# Patient Record
Sex: Female | Born: 1990 | Race: Black or African American | Hispanic: No | Marital: Single | Smoking: Never smoker
Health system: Southern US, Community
[De-identification: ages and names within clinical notes are randomized; demographics above are authoritative.]

## PROBLEM LIST (undated history)

## (undated) ENCOUNTER — Inpatient Hospital Stay (HOSPITAL_COMMUNITY): Payer: No Typology Code available for payment source | Admitting: Family Medicine

## (undated) DIAGNOSIS — G4730 Sleep apnea, unspecified: Secondary | ICD-10-CM

## (undated) DIAGNOSIS — E785 Hyperlipidemia, unspecified: Secondary | ICD-10-CM

## (undated) DIAGNOSIS — E119 Type 2 diabetes mellitus without complications: Secondary | ICD-10-CM

## (undated) DIAGNOSIS — I10 Essential (primary) hypertension: Secondary | ICD-10-CM

## (undated) HISTORY — DX: Essential (primary) hypertension: I10

## (undated) HISTORY — DX: Hyperlipidemia, unspecified: E78.5

## (undated) HISTORY — DX: Type 2 diabetes mellitus without complications: E11.9

---
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
## 2009-02-10 ENCOUNTER — Emergency Department (HOSPITAL_COMMUNITY): Admission: EM | Admit: 2009-02-10 | Discharge: 2009-02-10 | Payer: Self-pay | Admitting: Family Medicine

## 2009-10-02 ENCOUNTER — Ambulatory Visit (HOSPITAL_COMMUNITY): Admission: RE | Admit: 2009-10-02 | Discharge: 2009-10-02 | Payer: Self-pay | Admitting: Pediatrics

## 2009-11-18 ENCOUNTER — Observation Stay (HOSPITAL_COMMUNITY): Admission: EM | Admit: 2009-11-18 | Discharge: 2009-11-19 | Payer: Self-pay | Admitting: Emergency Medicine

## 2010-05-14 ENCOUNTER — Emergency Department (HOSPITAL_COMMUNITY): Admission: EM | Admit: 2010-05-14 | Discharge: 2010-05-14 | Payer: Self-pay | Admitting: Emergency Medicine

## 2011-01-12 ENCOUNTER — Emergency Department (HOSPITAL_COMMUNITY)
Admission: EM | Admit: 2011-01-12 | Discharge: 2011-01-13 | Disposition: A | Discharge status: Home / Self Care | Payer: Self-pay | Attending: Emergency Medicine | Admitting: Emergency Medicine

## 2011-01-12 DIAGNOSIS — R1110 Vomiting, unspecified: Secondary | ICD-10-CM | POA: Insufficient documentation

## 2011-01-12 DIAGNOSIS — R197 Diarrhea, unspecified: Secondary | ICD-10-CM | POA: Insufficient documentation

## 2011-01-12 LAB — URINALYSIS, ROUTINE W REFLEX MICROSCOPIC
Hgb urine dipstick: NEGATIVE
Ketones, ur: 15 mg/dL — AB
Leukocytes, UA: NEGATIVE
Nitrite: NEGATIVE
Protein, ur: 100 mg/dL — AB
Specific Gravity, Urine: 1.031 — ABNORMAL HIGH (ref 1.005–1.030)
Urobilinogen, UA: 0.2 mg/dL (ref 0.0–1.0)

## 2011-01-12 LAB — URINE MICROSCOPIC-ADD ON

## 2011-01-12 LAB — POCT PREGNANCY, URINE: Preg Test, Ur: NEGATIVE

## 2011-02-01 LAB — TYPE AND SCREEN
ABO/RH(D): A POS
Antibody Screen: NEGATIVE

## 2011-02-01 LAB — URINALYSIS, ROUTINE W REFLEX MICROSCOPIC
Glucose, UA: 100 mg/dL — AB
Leukocytes, UA: NEGATIVE
Nitrite: NEGATIVE
Protein, ur: 100 mg/dL — AB
pH: 6 (ref 5.0–8.0)

## 2011-02-01 LAB — CBC
HCT: 31.7 % — ABNORMAL LOW (ref 36.0–46.0)
HCT: 38.1 % (ref 36.0–46.0)
Hemoglobin: 10.7 g/dL — ABNORMAL LOW (ref 12.0–15.0)
Hemoglobin: 10.9 g/dL — ABNORMAL LOW (ref 12.0–15.0)
Hemoglobin: 12.7 g/dL (ref 12.0–15.0)
MCHC: 33.4 g/dL (ref 30.0–36.0)
MCHC: 33.8 g/dL (ref 30.0–36.0)
MCV: 71.9 fL — ABNORMAL LOW (ref 78.0–100.0)
MCV: 72.2 fL — ABNORMAL LOW (ref 78.0–100.0)
MCV: 72.4 fL — ABNORMAL LOW (ref 78.0–100.0)
Platelets: 320 10*3/uL (ref 150–400)
Platelets: 390 10*3/uL (ref 150–400)
RBC: 4.39 MIL/uL (ref 3.87–5.11)
RBC: 4.46 MIL/uL (ref 3.87–5.11)
RBC: 5.26 MIL/uL — ABNORMAL HIGH (ref 3.87–5.11)
RDW: 15.7 % — ABNORMAL HIGH (ref 11.5–15.5)
RDW: 16 % — ABNORMAL HIGH (ref 11.5–15.5)
WBC: 10.6 10*3/uL — ABNORMAL HIGH (ref 4.0–10.5)
WBC: 21.2 10*3/uL — ABNORMAL HIGH (ref 4.0–10.5)

## 2011-02-01 LAB — BASIC METABOLIC PANEL
CO2: 24 mEq/L (ref 19–32)
Calcium: 8.9 mg/dL (ref 8.4–10.5)
Chloride: 111 mEq/L (ref 96–112)
GFR calc Af Amer: >60 mL/min (ref >60)
Glucose, Bld: 108 mg/dL — ABNORMAL HIGH (ref 70–99)
Sodium: 140 mEq/L (ref 135–145)

## 2011-02-01 LAB — POCT PREGNANCY, URINE: Preg Test, Ur: NEGATIVE

## 2011-02-01 LAB — URINE MICROSCOPIC-ADD ON

## 2011-02-01 LAB — COMPREHENSIVE METABOLIC PANEL
ALT: 53 U/L — ABNORMAL HIGH (ref 0–35)
AST: 91 U/L — ABNORMAL HIGH (ref 0–37)
Albumin: 3.6 g/dL (ref 3.5–5.2)
Alkaline Phosphatase: 66 U/L (ref 39–117)
BUN: 10 mg/dL (ref 6–23)
CO2: 22 mEq/L (ref 19–32)
Calcium: 9.3 mg/dL (ref 8.4–10.5)
Chloride: 110 mEq/L (ref 96–112)
Creatinine, Ser: 1.07 mg/dL (ref 0.4–1.2)
GFR calc Af Amer: >60 mL/min (ref >60)
GFR calc non Af Amer: >60 mL/min (ref >60)
Glucose, Bld: 104 mg/dL — ABNORMAL HIGH (ref 70–99)
Potassium: 4.1 mEq/L (ref 3.5–5.1)
Sodium: 140 mEq/L (ref 135–145)
Total Bilirubin: 0.3 mg/dL (ref 0.3–1.2)
Total Protein: 7.4 g/dL (ref 6.0–8.3)

## 2011-02-01 LAB — RAPID URINE DRUG SCREEN, HOSP PERFORMED: Tetrahydrocannabinol: POSITIVE — AB

## 2011-02-01 LAB — POCT I-STAT, CHEM 8
BUN: 10 mg/dL (ref 6–23)
Calcium, Ion: 1.16 mmol/L (ref 1.12–1.32)
Chloride: 110 mEq/L (ref 96–112)
Creatinine, Ser: 1 mg/dL (ref 0.4–1.2)
Glucose, Bld: 109 mg/dL — ABNORMAL HIGH (ref 70–99)
HCT: 40 % (ref 36.0–46.0)
Hemoglobin: 13.6 g/dL (ref 12.0–15.0)
Potassium: 4 mEq/L (ref 3.5–5.1)
Sodium: 141 mEq/L (ref 135–145)
TCO2: 21 mmol/L (ref 0–100)

## 2011-02-01 LAB — LACTIC ACID, PLASMA: Lactic Acid, Venous: 3.1 mmol/L — ABNORMAL HIGH (ref 0.5–2.2)

## 2011-02-01 LAB — URINALYSIS, MICROSCOPIC ONLY
Bilirubin Urine: NEGATIVE
Glucose, UA: NEGATIVE mg/dL
Ketones, ur: NEGATIVE mg/dL
Protein, ur: NEGATIVE mg/dL
Urobilinogen, UA: 0.2 mg/dL (ref 0.0–1.0)

## 2011-02-01 LAB — ETHANOL: Alcohol, Ethyl (B): <5 mg/dL (ref 0–10)

## 2011-02-01 LAB — PROTIME-INR
INR: 0.98 (ref 0.00–1.49)
Prothrombin Time: 12.9 seconds (ref 11.6–15.2)

## 2011-02-01 LAB — APTT: aPTT: 24 seconds (ref 24–37)

## 2011-02-01 LAB — ABO/RH: ABO/RH(D): A POS

## 2011-02-19 ENCOUNTER — Inpatient Hospital Stay (INDEPENDENT_AMBULATORY_CARE_PROVIDER_SITE_OTHER)
Admission: RE | Admit: 2011-02-19 | Discharge: 2011-02-19 | Disposition: A | Discharge status: Home / Self Care | Payer: Medicaid Other | Source: Ambulatory Visit | Attending: Family Medicine | Admitting: Family Medicine

## 2011-02-19 DIAGNOSIS — R6889 Other general symptoms and signs: Secondary | ICD-10-CM

## 2011-03-31 NOTE — Procedures (Signed)
RENAL ARTERY DUPLEX EVALUATION   INDICATION:  Recent onset hypertension.   HISTORY:  Diabetes:  No.  Cardiac:  No.  Hypertension:  Yes.  Smoking:  No.   RENAL ARTERY DUPLEX FINDINGS:  Aorta-Proximal:  238 cm/s  Aorta-Mid:  134 cm/s  Aorta-Distal:  59 cm/s  Celiac Artery Origin:  146 cm/s  SMA Origin:  199 cm/s                                    RIGHT               LEFT  Renal Artery Origin:             208/60 cm/s         cm/s  Renal Artery Proximal:           71/38 cm/s          107/45 cm/s  Renal Artery Mid:                50/38 cm/s          42/20 cm/s  Renal Artery Distal:             63/34 cm/s          38/21 cm/s  Hilar Acceleration Time (AT):  Renal-Aortic Ratio (RAR):        <1.0                <1.0  Kidney Size:                     11.6 cm x 6.3 cm    11.8 cm x 5.4 cm  End Diastolic Ratio (EDR):       0.47 to 0.57        0.44 to 0.57  Resistive Index (RI):            0.47                0.58   IMPRESSION:  1. Kidneys are normal with respect to size and shape bilaterally.  2. Resistive indices and end diastolic ratio suggests no evidence of      parenchymal disease bilaterally.  3. Renal to aortic ratio suggests no significant renal artery      occlusive disease.       ___________________________________________  Larina Earthly, M.D.   MC/MEDQ  D:  11/07/2008  T:  11/07/2008  Job:  [id number]

## 2011-04-03 NOTE — Procedures (Signed)
NAME:  Grace Daniels, Grace Daniels NO.:  [id number]   MEDICAL RECORD NO.:  [id number]          PATIENT TYPE:  OUT   LOCATION:  SLEEP CENTER                 FACILITY:  [facility]   PHYSICIAN:  Clinton D. Maple Hudson, M.D. DATE OF BIRTH:  11-Jun-1991   DATE OF STUDY:  11/21/2005                              NOCTURNAL POLYSOMNOGRAM   REFERRING PHYSICIAN:  Dr. Osborn Coho.   DATE OF STUDY:  November 21, 2005.   INDICATION FOR STUDY:  Hypersomnia with sleep apnea.   EPWORTH SLEEPINESS SCORE:  6/24.   BMI:  36.9.   WEIGHT:  250 pounds.   HOME MEDICATIONS:  Norvasc.   SLEEP ARCHITECTURE:  Total sleep time 367 minutes with sleep efficiency 79%.  Stage I was 4%, stage II 64%, stages III and IV 12%, REM 20% of total sleep  time. Sleep latency 46 minutes, REM latency 224 minutes, awake after sleep  onset 50 minutes, arousal index 27.8. No bedtime medication reported.   RESPIRATORY DATA:  Split study protocol:  Apnea/hypopnea index (AHI, RDI)  26.8 obstructive events per hour indicating moderate obstructive sleep  apnea/hypopnea syndrome. There were 2 obstructive apneas and 60 hypopneas  before C-PAP. Events were more common while supine (AHI 12.4 per hour). REM  AHI 0. C-PAP was titrated to 9 CWP, AHI 0 per hour. A small full-face ultra  mirage mask was used with heated humidifier.   OXYGEN DATA:  Loud snoring with oxygen desaturation briefly to a nadir of  38%. This may include artifact. After C-PAP control, saturation held 95-98%  on room air with snoring eliminated.   CARDIAC DATA:  Normal sinus rhythm.   MOVEMENT/PARASOMNIA:  A total of 130 limb jerks were reported of which only  7 were associated with arousal or awakening for a periodic limb movement  with arousal index of 1.1 per hour which is insignificant. Bathroom x1.   IMPRESSION/RECOMMENDATIONS:  1.  Moderate obstructive sleep apnea/hypopnea syndrome, AHI 26.8 per hour      with events more common while supine. Very loud  snoring with transient      oxygen desaturation which may include artifact to 38%. Normal      oxygenation with snoring prevented once C-PAP was established.  2.  A small full-face ultra mirage mask was used with heated humidifier.      Clinton D. Maple Hudson, M.D.  Diplomate, Biomedical engineer of Sleep Medicine  Electronically Signed     CDY/MEDQ  D:  11/29/2005 11:43:27  T:  11/29/2005 22:45:26  Job:  [id number]

## 2011-04-14 ENCOUNTER — Encounter: Payer: Self-pay | Admitting: Cardiology

## 2011-04-15 ENCOUNTER — Institutional Professional Consult (permissible substitution): Payer: Medicaid Other | Admitting: Cardiology

## 2011-06-01 ENCOUNTER — Ambulatory Visit (INDEPENDENT_AMBULATORY_CARE_PROVIDER_SITE_OTHER): Payer: Medicaid Other

## 2011-06-01 ENCOUNTER — Inpatient Hospital Stay (INDEPENDENT_AMBULATORY_CARE_PROVIDER_SITE_OTHER)
Admission: RE | Admit: 2011-06-01 | Discharge: 2011-06-01 | Disposition: A | Discharge status: Home / Self Care | Payer: Medicaid Other | Source: Ambulatory Visit | Attending: Family Medicine | Admitting: Family Medicine

## 2011-06-01 DIAGNOSIS — S93409A Sprain of unspecified ligament of unspecified ankle, initial encounter: Secondary | ICD-10-CM

## 2011-06-01 DIAGNOSIS — W1789XA Other fall from one level to another, initial encounter: Secondary | ICD-10-CM

## 2011-09-01 LAB — POCT PREGNANCY, URINE
Operator id: 285841
Preg Test, Ur: NEGATIVE

## 2011-09-01 LAB — URINALYSIS, ROUTINE W REFLEX MICROSCOPIC
Bilirubin Urine: NEGATIVE
Glucose, UA: NEGATIVE
Hgb urine dipstick: NEGATIVE
Ketones, ur: NEGATIVE
Nitrite: NEGATIVE
Protein, ur: NEGATIVE
Specific Gravity, Urine: 1.023
Urobilinogen, UA: 1
pH: 6.5

## 2011-10-25 ENCOUNTER — Emergency Department (INDEPENDENT_AMBULATORY_CARE_PROVIDER_SITE_OTHER)
Admission: EM | Admit: 2011-10-25 | Discharge: 2011-10-25 | Disposition: A | Discharge status: Home / Self Care | Payer: Medicaid Other | Source: Home / Self Care | Attending: Emergency Medicine | Admitting: Emergency Medicine

## 2011-10-25 ENCOUNTER — Encounter (HOSPITAL_COMMUNITY): Payer: Self-pay

## 2011-10-25 DIAGNOSIS — H60399 Other infective otitis externa, unspecified ear: Secondary | ICD-10-CM

## 2011-10-25 DIAGNOSIS — H6090 Unspecified otitis externa, unspecified ear: Secondary | ICD-10-CM

## 2011-10-25 MED ORDER — IBUPROFEN 800 MG PO TABS: 800.0000 mg | ORAL_TABLET | Freq: Three times a day (TID) | ORAL | Status: AC

## 2011-10-25 MED ORDER — NEOMYCIN-POLYMYXIN-HC 3.5-10000-1 OT SUSP: 4.0000 [drp] | Freq: Three times a day (TID) | OTIC | Status: AC

## 2011-10-25 NOTE — ED Provider Notes (Signed)
History     CSN: [id number] Arrival date & time: 10/25/2011  1:40 PM   First MD Initiated Contact with Patient 10/25/11 1333      Chief Complaint  Patient presents with  . Otalgia    pain in left ear     (Consider location/radiation/quality/duration/timing/severity/associated sxs/prior treatment) Patient is a 20 y.o. female presenting with ear pain. The history is provided by the patient.  Otalgia This is a new problem. The current episode started 2 days ago. There is pain in the left ear. The problem occurs constantly. The problem has not changed since onset.There has been no fever. Pertinent negatives include no ear discharge, no hearing loss, no rhinorrhea, no sore throat and no neck pain. Her past medical history does not include chronic ear infection.    Past Medical History  Diagnosis Date  . Hypertension   . Hyperlipidemia     History reviewed. No pertinent past surgical history.  History reviewed. No pertinent family history.  History  Substance Use Topics  . Smoking status: Never Smoker   . Smokeless tobacco: Never Used  . Alcohol Use: No    OB History    Grav Para Term Preterm Abortions TAB SAB Ect Mult Living                  Review of Systems  Constitutional: Negative.   HENT: Positive for ear pain. Negative for hearing loss, congestion, sore throat, rhinorrhea, neck pain and ear discharge.   Eyes: Negative.   Hematological: Negative for adenopathy.    Allergies  Review of patient's allergies indicates no known allergies.  Home Medications   Current Outpatient Rx  Name Route Sig Dispense Refill  . AMLODIPINE BESYLATE 10 MG PO TABS Oral Take 10 mg by mouth daily.      . TRIAMTERENE-HCTZ 37.5-25 MG PO CAPS Oral Take 1 capsule by mouth every morning.      . IBUPROFEN 800 MG PO TABS Oral Take 1 tablet (800 mg total) by mouth 3 (three) times daily. 30 tablet 0  . NEOMYCIN-POLYMYXIN-HC 3.5-10000-1 OT SUSP Left Ear Place 4 drops into the left ear 3  (three) times daily. 10 mL 0    BP 171/95  Pulse 85  Temp(Src) 97.8 F (36.6 C) (Oral)  Resp 16  SpO2 98%  Physical Exam  Nursing note and vitals reviewed. Constitutional: She appears well-developed and well-nourished.  HENT:  Head: Normocephalic and atraumatic.  Right Ear: Tympanic membrane, external ear and ear canal normal.  Left Ear: Tympanic membrane normal. There is swelling and tenderness. No drainage. Tympanic membrane is not injected and not erythematous. No decreased hearing is noted.  Mouth/Throat: Oropharynx is clear and moist.    ED Course  Procedures (including critical care time)  Labs Reviewed - No data to display No results found.   1. Otitis external       MDM          Barkley Bruns, MD 10/25/11 [phone]

## 2011-10-25 NOTE — ED Notes (Signed)
Pt c/o pain in left ear and states can't hear out of left ear started 2 days ago

## 2011-12-31 ENCOUNTER — Encounter (HOSPITAL_COMMUNITY): Payer: Self-pay | Admitting: Emergency Medicine

## 2011-12-31 ENCOUNTER — Emergency Department (HOSPITAL_COMMUNITY)
Admission: EM | Admit: 2011-12-31 | Discharge: 2011-12-31 | Disposition: A | Discharge status: Home / Self Care | Payer: Self-pay | Attending: Emergency Medicine | Admitting: Emergency Medicine

## 2011-12-31 DIAGNOSIS — R51 Headache: Secondary | ICD-10-CM | POA: Insufficient documentation

## 2011-12-31 DIAGNOSIS — E785 Hyperlipidemia, unspecified: Secondary | ICD-10-CM | POA: Insufficient documentation

## 2011-12-31 DIAGNOSIS — R05 Cough: Secondary | ICD-10-CM | POA: Insufficient documentation

## 2011-12-31 DIAGNOSIS — IMO0001 Reserved for inherently not codable concepts without codable children: Secondary | ICD-10-CM | POA: Insufficient documentation

## 2011-12-31 DIAGNOSIS — I10 Essential (primary) hypertension: Secondary | ICD-10-CM | POA: Insufficient documentation

## 2011-12-31 DIAGNOSIS — R509 Fever, unspecified: Secondary | ICD-10-CM | POA: Insufficient documentation

## 2011-12-31 DIAGNOSIS — J3489 Other specified disorders of nose and nasal sinuses: Secondary | ICD-10-CM | POA: Insufficient documentation

## 2011-12-31 DIAGNOSIS — R059 Cough, unspecified: Secondary | ICD-10-CM | POA: Insufficient documentation

## 2011-12-31 DIAGNOSIS — J069 Acute upper respiratory infection, unspecified: Secondary | ICD-10-CM | POA: Insufficient documentation

## 2011-12-31 NOTE — ED Provider Notes (Signed)
History     CSN: [id number]  Arrival date & time 12/31/11  4098   First MD Initiated Contact with Patient 12/31/11 1957      Chief Complaint  Patient presents with  . URI    (Consider location/radiation/quality/duration/timing/severity/associated sxs/prior treatment) Patient is a 21 y.o. female presenting with URI. No language interpreter was used.  URI The primary symptoms include fever, headaches, cough and myalgias. The current episode started 3 to 5 days ago. This is a new problem. The problem has been gradually worsening.  The fever began 3 to 5 days ago. The fever has been unchanged since its onset. The maximum temperature recorded prior to her arrival was unknown.  The headache began more than 2 days ago. Headache is a new problem. The headache is present intermittently. The headache is not associated with aura, photophobia, double vision, eye pain, decreased vision, stiff neck, paresthesias, weakness or loss of balance.  The cough began 3 to 5 days ago. The cough is productive and hacking. The sputum is white.  Myalgias began 3 to 5 days ago. The myalgias are generalized. The discomfort from the myalgias is mild. The myalgias are not associated with weakness.  The onset of the illness is associated with exposure to sick contacts. Symptoms associated with the illness include chills and congestion.    Past Medical History  Diagnosis Date  . Hypertension   . Hyperlipidemia     History reviewed. No pertinent past surgical history.  History reviewed. No pertinent family history.  History  Substance Use Topics  . Smoking status: Never Smoker   . Smokeless tobacco: Never Used  . Alcohol Use: No    OB History    Grav Para Term Preterm Abortions TAB SAB Ect Mult Living                  Review of Systems  Constitutional: Positive for fever and chills.  HENT: Positive for congestion.   Eyes: Negative for double vision, photophobia and pain.  Respiratory: Positive for  cough.   Musculoskeletal: Positive for myalgias.  Neurological: Positive for headaches. Negative for weakness, paresthesias and loss of balance.  All other systems reviewed and are negative.    Allergies  Review of patient's allergies indicates no known allergies.  Home Medications   Current Outpatient Rx  Name Route Sig Dispense Refill  . TUSSIN CHEST CONGESTION PO Oral Take 10 mLs by mouth every 4 (four) hours as needed. For cough    . TRIAMTERENE-HCTZ 37.5-25 MG PO CAPS Oral Take 1 capsule by mouth every morning.        BP 133/76  Pulse 122  Temp(Src) 97.8 F (36.6 C) (Oral)  Resp 18  SpO2 96%  Physical Exam  Nursing note and vitals reviewed. Constitutional: She is oriented to person, place, and time. She appears well-developed and well-nourished.  HENT:  Head: Normocephalic and atraumatic.  Eyes: Pupils are equal, round, and reactive to light.  Neck: Normal range of motion. Neck supple.  Cardiovascular: Normal rate, regular rhythm, normal heart sounds and intact distal pulses.   Pulmonary/Chest: Effort normal and breath sounds normal. She has no wheezes. She has no rales. She exhibits no tenderness.  Abdominal: Soft. Bowel sounds are normal.  Musculoskeletal: Normal range of motion.  Neurological: She is alert and oriented to person, place, and time.  Skin: Skin is warm and dry.  Psychiatric: She has a normal mood and affect.    ED Course  Procedures (including critical care time)  Labs Reviewed - No data to display No results found.   No diagnosis found.  URI  MDM          Jimmye Norman, NP 12/31/11 2021

## 2011-12-31 NOTE — ED Notes (Signed)
Pt here for cough x 1 week with cold sx

## 2011-12-31 NOTE — Discharge Instructions (Signed)

## 2011-12-31 NOTE — ED Provider Notes (Signed)
Medical screening examination/treatment/procedure(s) were performed by non-physician practitioner and as supervising physician I was immediately available for consultation/collaboration.  Ethelda Chick, MD 12/31/11 [phone]

## 2012-02-05 ENCOUNTER — Emergency Department (INDEPENDENT_AMBULATORY_CARE_PROVIDER_SITE_OTHER)
Admission: EM | Admit: 2012-02-05 | Discharge: 2012-02-05 | Disposition: A | Discharge status: Home / Self Care | Payer: Self-pay | Source: Home / Self Care | Attending: Family Medicine | Admitting: Family Medicine

## 2012-02-05 ENCOUNTER — Encounter (HOSPITAL_COMMUNITY): Payer: Self-pay

## 2012-02-05 DIAGNOSIS — H6122 Impacted cerumen, left ear: Secondary | ICD-10-CM

## 2012-02-05 DIAGNOSIS — H6120 Impacted cerumen, unspecified ear: Secondary | ICD-10-CM

## 2012-02-05 HISTORY — DX: Sleep apnea, unspecified: G47.30

## 2012-02-05 NOTE — ED Provider Notes (Signed)
History     CSN: [id number]  Arrival date & time 02/05/12  1242   First MD Initiated Contact with Patient 02/05/12 1329      Chief Complaint  Patient presents with  . Otalgia    (Consider location/radiation/quality/duration/timing/severity/associated sxs/prior treatment) Patient is a 21 y.o. female presenting with ear pain. The history is provided by the patient.  Otalgia This is a new problem. The current episode started more than 1 week ago (onset after using q-tips). There is pain in the left ear. The problem occurs constantly. The problem has not changed since onset.There has been no fever. The pain is mild. Associated symptoms include ear discharge. Her past medical history is significant for hearing loss.    Past Medical History  Diagnosis Date  . Hypertension   . Hyperlipidemia   . Sleep apnea     History reviewed. No pertinent past surgical history.  No family history on file.  History  Substance Use Topics  . Smoking status: Never Smoker   . Smokeless tobacco: Never Used  . Alcohol Use: No    OB History    Grav Para Term Preterm Abortions TAB SAB Ect Mult Living                  Review of Systems  Constitutional: Negative.   HENT: Positive for ear pain and ear discharge.     Allergies  Review of patient's allergies indicates no known allergies.  Home Medications   Current Outpatient Rx  Name Route Sig Dispense Refill  . TRIAMTERENE-HCTZ 37.5-25 MG PO CAPS Oral Take 1 capsule by mouth every morning.      Elmo Putt CHEST CONGESTION PO Oral Take 10 mLs by mouth every 4 (four) hours as needed. For cough      BP 120/79  Pulse 110  Temp(Src) 97.9 F (36.6 C) (Oral)  Resp 20  SpO2 98%  LMP 01/29/2012  Physical Exam  Nursing note and vitals reviewed. Constitutional: She appears well-developed and well-nourished.  HENT:  Right Ear: Tympanic membrane, external ear and ear canal normal.  Ears:    ED Course  Procedures (including critical care  time)  Labs Reviewed - No data to display No results found.   1. Impacted cerumen of left ear       MDM  Sx resolved after irrig, tm nl.        Linna Hoff, MD 02/05/12 1414

## 2012-02-05 NOTE — Discharge Instructions (Signed)
No Q-tips.

## 2012-02-05 NOTE — ED Notes (Signed)
C/o lt ear pain for 1 week.  Denies fever or cold sx.

## 2012-03-18 ENCOUNTER — Emergency Department (HOSPITAL_COMMUNITY)
Admission: EM | Admit: 2012-03-18 | Discharge: 2012-03-18 | Disposition: A | Discharge status: Home / Self Care | Payer: Self-pay | Attending: Emergency Medicine | Admitting: Emergency Medicine

## 2012-03-18 ENCOUNTER — Encounter (HOSPITAL_COMMUNITY): Payer: Self-pay | Admitting: Emergency Medicine

## 2012-03-18 DIAGNOSIS — I10 Essential (primary) hypertension: Secondary | ICD-10-CM | POA: Insufficient documentation

## 2012-03-18 DIAGNOSIS — E785 Hyperlipidemia, unspecified: Secondary | ICD-10-CM | POA: Insufficient documentation

## 2012-03-18 DIAGNOSIS — K219 Gastro-esophageal reflux disease without esophagitis: Secondary | ICD-10-CM | POA: Insufficient documentation

## 2012-03-18 DIAGNOSIS — R079 Chest pain, unspecified: Secondary | ICD-10-CM | POA: Insufficient documentation

## 2012-03-18 DIAGNOSIS — Z79899 Other long term (current) drug therapy: Secondary | ICD-10-CM | POA: Insufficient documentation

## 2012-03-18 MED ORDER — OMEPRAZOLE 20 MG PO CPDR: 20.0000 mg | DELAYED_RELEASE_CAPSULE | Freq: Every day | ORAL | Status: DC

## 2012-03-18 NOTE — Discharge Instructions (Signed)
Diet for GERD or PUD Nutrition therapy can help ease the discomfort of gastroesophageal reflux disease (GERD) and peptic ulcer disease (PUD).  HOME CARE INSTRUCTIONS   Eat your meals slowly, in a relaxed setting.   Eat 5 to 6 small meals per day.   If a food causes distress, stop eating it for a period of time.  FOODS TO AVOID  Coffee, regular or decaffeinated.   Cola beverages, regular or low calorie.   Tea, regular or decaffeinated.   Pepper.   Cocoa.   High fat foods, including meats.   Butter, margarine, hydrogenated oil (trans fats).   Peppermint or spearmint (if you have GERD).   Fruits and vegetables if not tolerated.   Alcohol.   Nicotine (smoking or chewing). This is one of the most potent stimulants to acid production in the gastrointestinal tract.   Any food that seems to aggravate your condition.  If you have questions regarding your diet, ask your caregiver or a registered dietitian. TIPS  Lying flat may make symptoms worse. Keep the head of your bed raised 6 to 9 inches (15 to 23 cm) by using a foam wedge or blocks under the legs of the bed.   Do not lay down until 3 hours after eating a meal.   Daily physical activity may help reduce symptoms.  MAKE SURE YOU:   Understand these instructions.   Will watch your condition.   Will get help right away if you are not doing well or get worse.  Document Released: 11/02/2005 Document Revised: 10/22/2011 Document Reviewed: 09/18/2011 [facility] Patient Information 2012 [place], [place].Gastroesophageal Reflux Disease, Adult Gastroesophageal reflux disease (GERD) happens when acid from your stomach goes into your food pipe (esophagus). The acid can cause a burning feeling in your chest. Over time, the acid can make small holes (ulcers) in your food pipe.  HOME CARE  Ask your doctor for advice about:   Losing weight.   Quitting smoking.   Alcohol use.   Avoid foods and drinks that make your problems  worse. You may want to avoid:   Caffeine and alcohol.   Chocolate.   Mints.   Garlic and onions.   Spicy foods.   Citrus fruits, such as oranges, lemons, or limes.   Foods that contain tomato, such as sauce, chili, salsa, and pizza.   Fried and fatty foods.   Avoid lying down for 3 hours before you go to bed or before you take a nap.   Eat small meals often, instead of large meals.   Wear loose-fitting clothing. Do not wear anything tight around your waist.   Raise (elevate) the head of your bed 6 to 8 inches with wood blocks. Using extra pillows does not help.   Only take medicines as told by your doctor.   Do not take aspirin or ibuprofen.  GET HELP RIGHT AWAY IF:   You have pain in your arms, neck, jaw, teeth, or back.   Your pain gets worse or changes.   You feel sick to your stomach (nauseous), throw up (vomit), or sweat (diaphoresis).   You feel short of breath, or you pass out (faint).   Your throw up is green, yellow, black, or looks like coffee grounds or blood.   Your poop (stool) is red, bloody, or black.  MAKE SURE YOU:   Understand these instructions.   Will watch your condition.   Will get help right away if you are not doing well or get worse.  Document Released: 04/20/2008 Document Revised: 10/22/2011 Document Reviewed: 05/22/2011 [employer] Patient Information 2012 [place], [facility] Patient Information 2012 [place], [place].

## 2012-03-18 NOTE — ED Notes (Signed)
Pt states that she has  Cp x  Week it is a burning sensation states that occ she feels her heart jerking, states loves catsup, tried a zantac a zantac one time with pain and it helped

## 2012-03-18 NOTE — ED Provider Notes (Signed)
History   This chart was scribed for Nelia Shi, MD by Brooks Sailors. The patient was seen in room STRE8/STRE8. Patient's care was started at 1055.   CSN: [id number]  Arrival date & time 03/18/12  1055   First MD Initiated Contact with Patient 03/18/12 1113      Chief Complaint  Patient presents with  . Chest Pain    (Consider location/radiation/quality/duration/timing/severity/associated sxs/prior treatment) HPI  Grace Daniels is a 21 y.o. female who presents to the Emergency Department complaining of intermittent chest pain onset one week ago. Patient says the pain is in the center of her chest and does not radiate but has an urge to "burp" but nothing comes up. Patient believes the pain may be from acid-reflux and has been taking Zantac which has alleviated symptoms. Says symptoms are worse at night when the patient lays down. Patient denies a history of heart problems.     Past Medical History  Diagnosis Date  . Hypertension   . Hyperlipidemia   . Sleep apnea     History reviewed. No pertinent past surgical history.  History reviewed. No pertinent family history.  History  Substance Use Topics  . Smoking status: Never Smoker   . Smokeless tobacco: Never Used  . Alcohol Use: No    OB History    Grav Para Term Preterm Abortions TAB SAB Ect Mult Living                  Review of Systems  All other systems reviewed and are negative.    Allergies  Review of patient's allergies indicates no known allergies.  Home Medications   Current Outpatient Rx  Name Route Sig Dispense Refill  . RANITIDINE HCL 75 MG PO TABS Oral Take 75 mg by mouth 2 (two) times daily as needed. Acid indigestion    . TRIAMTERENE-HCTZ 37.5-25 MG PO CAPS Oral Take 1 capsule by mouth every morning.      Marland Kitchen OMEPRAZOLE 20 MG PO CPDR Oral Take 1 capsule (20 mg total) by mouth daily. 30 capsule 0    BP 132/72  Pulse 97  Temp(Src) 97.5 F (36.4 C) (Oral)  Resp 20  SpO2  98%  Physical Exam  Nursing note and vitals reviewed. Constitutional: She is oriented to person, place, and time. She appears well-developed and well-nourished.  HENT:  Head: Normocephalic and atraumatic.  Neck: Normal range of motion. Neck supple.  Cardiovascular: Normal rate.  Exam reveals no gallop and no friction rub.   No murmur heard.       Date: 03/20/2012  Rate:83  Rhythm: normal sinus rhythm  QRS Axis: normal  Intervals: normal  ST/T Wave abnormalities: normal  Conduction Disutrbances: none  Narrative Interpretation: unremarkable      Pulmonary/Chest: Effort normal and breath sounds normal.       Equal bilateral.   Abdominal: Soft. Bowel sounds are normal.       Active bowel sounds.   Musculoskeletal: Normal range of motion.  Neurological: She is alert and oriented to person, place, and time.  Skin: Skin is warm and dry.  Psychiatric: She has a normal mood and affect.    ED Course  Procedures (including critical care time) DIAGNOSTIC STUDIES: Oxygen Saturation is 98% on room air, normal by my interpretation.    COORDINATION OF CARE: 11:24AM - Patient recommended to take zantac over the next two weeks.    Labs Reviewed - No data to display No results found.  1. GERD (gastroesophageal reflux disease)       MDM  I personally performed the services described in this documentation, which was scribed in my presence. The recorded information has been reviewed and considered.       Nelia Shi, MD 03/20/12 [phone]

## 2012-03-18 NOTE — ED Notes (Signed)
Pt c/o midsternal/epigastric pain x 1 week worse after eating and laying flat that goes through to back; pt sts some SOB

## 2013-02-11 ENCOUNTER — Encounter (HOSPITAL_COMMUNITY): Payer: Self-pay | Admitting: *Deleted

## 2013-02-11 ENCOUNTER — Emergency Department (HOSPITAL_COMMUNITY)
Admission: EM | Admit: 2013-02-11 | Discharge: 2013-02-12 | Disposition: A | Discharge status: Home / Self Care | Payer: Self-pay | Attending: Emergency Medicine | Admitting: Emergency Medicine

## 2013-02-11 DIAGNOSIS — H6122 Impacted cerumen, left ear: Secondary | ICD-10-CM

## 2013-02-11 DIAGNOSIS — H6120 Impacted cerumen, unspecified ear: Secondary | ICD-10-CM | POA: Insufficient documentation

## 2013-02-11 DIAGNOSIS — Z8669 Personal history of other diseases of the nervous system and sense organs: Secondary | ICD-10-CM | POA: Insufficient documentation

## 2013-02-11 DIAGNOSIS — I10 Essential (primary) hypertension: Secondary | ICD-10-CM | POA: Insufficient documentation

## 2013-02-11 DIAGNOSIS — Z8639 Personal history of other endocrine, nutritional and metabolic disease: Secondary | ICD-10-CM | POA: Insufficient documentation

## 2013-02-11 DIAGNOSIS — Z862 Personal history of diseases of the blood and blood-forming organs and certain disorders involving the immune mechanism: Secondary | ICD-10-CM | POA: Insufficient documentation

## 2013-02-11 DIAGNOSIS — Z79899 Other long term (current) drug therapy: Secondary | ICD-10-CM | POA: Insufficient documentation

## 2013-02-11 MED ORDER — CARBAMIDE PEROXIDE 6.5 % OT SOLN: 5.0000 [drp] | Freq: Two times a day (BID) | OTIC | Status: DC

## 2013-02-11 MED ORDER — ANTIPYRINE-BENZOCAINE 5.4-1.4 % OT SOLN: 3.0000 [drp] | OTIC | Status: DC | PRN

## 2013-02-11 MED ORDER — ANTIPYRINE-BENZOCAINE 5.4-1.4 % OT SOLN
3.0000 [drp] | Freq: Once | OTIC | Status: AC
  Administered 2013-02-12: 4 [drp] via OTIC
  Filled 2013-02-11: qty 10

## 2013-02-11 NOTE — ED Provider Notes (Signed)
History    This chart was scribed for non-physician practitioner, Marlon Pel PA-C working with Rolan Bucco, MD by Smitty Pluck, ED scribe. This patient was seen in room WTR6/WTR6 and the patient's care was started at 10:16 PM.   CSN: [id number]  Arrival date & time 02/11/13  2116   Chief Complaint  Patient presents with  . Otalgia    The history is provided by the patient. No language interpreter was used.   Grace Daniels is a 22 y.o. female with hx of HTN who presents to the Emergency Department complaining of constant, moderate left otalgia onset 3 days ago. She reports that there was sudden onset. She states she has hx of left ear otalgia. She reports that she thinks that she pushed the q-tip too far into her ear. She mentions having decreased hearing in left ear. She states that she has used ear drops without relief. Pt denies fever, chills, nausea, vomiting, diarrhea, weakness, cough, SOB and any other pain.    Past Medical History  Diagnosis Date  . Hypertension   . Hyperlipidemia   . Sleep apnea     History reviewed. No pertinent past surgical history.  History reviewed. No pertinent family history.  History  Substance Use Topics  . Smoking status: Never Smoker   . Smokeless tobacco: Never Used  . Alcohol Use: No    OB History   Grav Para Term Preterm Abortions TAB SAB Ect Mult Living                  Review of Systems  Constitutional: Negative for fever and chills.  HENT: Positive for ear pain.   Respiratory: Negative for shortness of breath.   Gastrointestinal: Negative for nausea and vomiting.  Neurological: Negative for weakness.  All other systems reviewed and are negative.    Allergies  Review of patient's allergies indicates no known allergies.  Home Medications   Current Outpatient Rx  Name  Route  Sig  Dispense  Refill  . triamterene-hydrochlorothiazide (DYAZIDE) 37.5-25 MG per capsule   Oral   Take 1 capsule by mouth every morning.            Marland Kitchen antipyrine-benzocaine (AURALGAN) otic solution   Both Ears   Place 3 drops into both ears every 2 (two) hours as needed for pain.   10 mL   0   . carbamide peroxide (DEBROX) 6.5 % otic solution   Both Ears   Place 5 drops into both ears 2 (two) times daily.   15 mL   0   . carbamide peroxide (DEBROX) 6.5 % otic solution   Left Ear   Place 5 drops into the left ear 2 (two) times daily.   15 mL   0     BP 149/98  Pulse 124  Temp(Src) 98.1 F (36.7 C) (Oral)  Resp 20  SpO2 94%  Physical Exam  Nursing note and vitals reviewed. Constitutional: She is oriented to person, place, and time. She appears well-developed and well-nourished. No distress.  HENT:  Head: Normocephalic and atraumatic.  Right Ear: External ear and ear canal normal. Decreased hearing is noted.  Left Ear: External ear and ear canal normal. Decreased hearing is noted.  Bilateral cerumen impaction  Eyes: EOM are normal.  Neck: Neck supple. No tracheal deviation present.  Cardiovascular: Normal rate.   Pulmonary/Chest: Effort normal. No respiratory distress.  Musculoskeletal: Normal range of motion.  Neurological: She is alert and oriented to person, place, and  time.  Skin: Skin is warm and dry.  Psychiatric: She has a normal mood and affect. Her behavior is normal.    ED Course  Procedures (including critical care time) DIAGNOSTIC STUDIES: Oxygen Saturation is 94% on room air, adequate by my interpretation.    COORDINATION OF CARE: 10:17 PM Discussed ED treatment with pt and pt agrees to ear irrigation and ear drops.     Labs Reviewed - No data to display No results found.   1. Cerumen impaction, left       MDM  Ear lavage done. Partial removal or wax. Debrox and Auralgan.   Pt has been advised of the symptoms that warrant their return to the ED. Patient has voiced understanding and has agreed to follow-up with the PCP or specialist.  I personally performed the services  described in this documentation, which was scribed in my presence. The recorded information has been reviewed and is accurate.    Dorthula Matas, PA-C 02/11/13 2232

## 2013-02-11 NOTE — ED Notes (Signed)
Pt in c/o left earache x3days, unsure of fever, no distress noted

## 2013-02-11 NOTE — ED Provider Notes (Signed)
Medical screening examination/treatment/procedure(s) were performed by non-physician practitioner and as supervising physician I was immediately available for consultation/collaboration.   Rolan Bucco, MD 02/11/13 2256

## 2013-02-12 ENCOUNTER — Telehealth (HOSPITAL_COMMUNITY): Payer: Self-pay | Admitting: Emergency Medicine

## 2013-02-12 NOTE — ED Notes (Signed)
Patient calling to see if she can have a work note that states that she was here faxed to her job.

## 2013-03-03 ENCOUNTER — Encounter (HOSPITAL_COMMUNITY): Payer: Self-pay | Admitting: *Deleted

## 2013-03-03 ENCOUNTER — Inpatient Hospital Stay (HOSPITAL_COMMUNITY)
Admission: RE | Admit: 2013-03-03 | Discharge: 2013-03-03 | Disposition: A | Discharge status: Home / Self Care | Payer: Self-pay | Source: Ambulatory Visit | Attending: Obstetrics & Gynecology | Admitting: Obstetrics & Gynecology

## 2013-03-03 DIAGNOSIS — A084 Viral intestinal infection, unspecified: Secondary | ICD-10-CM

## 2013-03-03 DIAGNOSIS — A088 Other specified intestinal infections: Secondary | ICD-10-CM

## 2013-03-03 DIAGNOSIS — R109 Unspecified abdominal pain: Secondary | ICD-10-CM | POA: Insufficient documentation

## 2013-03-03 LAB — URINE MICROSCOPIC-ADD ON

## 2013-03-03 LAB — URINALYSIS, ROUTINE W REFLEX MICROSCOPIC
Bilirubin Urine: NEGATIVE
Ketones, ur: NEGATIVE mg/dL
Leukocytes, UA: NEGATIVE
Nitrite: NEGATIVE
Specific Gravity, Urine: >1.03 — ABNORMAL HIGH (ref 1.005–1.030)
Urobilinogen, UA: 0.2 mg/dL (ref 0.0–1.0)

## 2013-03-03 LAB — POCT PREGNANCY, URINE: Preg Test, Ur: NEGATIVE

## 2013-03-03 MED ORDER — ONDANSETRON HCL 4 MG PO TABS: 4.0000 mg | ORAL_TABLET | Freq: Three times a day (TID) | ORAL | Status: DC | PRN

## 2013-03-03 MED ORDER — ONDANSETRON 8 MG PO TBDP
8.0000 mg | ORAL_TABLET | Freq: Once | ORAL | Status: AC
  Administered 2013-03-03: 8 mg via ORAL
  Filled 2013-03-03: qty 1

## 2013-03-03 NOTE — MAU Note (Deleted)
Pt reports for the last week she has been having "having funny feelings in my stomach". Burning with urination

## 2013-03-03 NOTE — MAU Note (Signed)
Pt. Here due to stomach pains that started earlier today. Did vomit 4-5 hours ago. Denies nausea and diarrhea.

## 2013-03-03 NOTE — MAU Provider Note (Signed)
Chief Complaint: Abdominal Pain   First Provider Initiated Contact with Patient 03/03/13 0238     SUBJECTIVE HPI: Grace Daniels is a 22 y.o. G0P0 non-pregnant female who presents with moderate mid abd pain 5/10 at worst and vomiting earlier today. No further pain or vomiting x 4 hours. No low abd pain or epigastric pain.  Denies fever, chills, sick contacts diarrhea, vaginal discharge, urinary complaints, or abnormal vaginal bleeding.  Last BM yesterday.   Past Medical History  Diagnosis Date  . Hypertension   . Hyperlipidemia   . Sleep apnea    OB History   Grav Para Term Preterm Abortions TAB SAB Ect Mult Living   0              History reviewed. No pertinent past surgical history. History   Social History  . Marital Status: Single    Spouse Name: N/A    Number of Children: N/A  . Years of Education: N/A   Occupational History  . Not on file.   Social History Main Topics  . Smoking status: Never Smoker   . Smokeless tobacco: Never Used  . Alcohol Use: No  . Drug Use: No  . Sexually Active: No   Other Topics Concern  . Not on file   Social History Narrative  . No narrative on file   No current facility-administered medications on file prior to encounter.   Current Outpatient Prescriptions on File Prior to Encounter  Medication Sig Dispense Refill  . triamterene-hydrochlorothiazide (DYAZIDE) 37.5-25 MG per capsule Take 1 capsule by mouth every morning.        Marland Kitchen antipyrine-benzocaine (AURALGAN) otic solution Place 3 drops into both ears every 2 (two) hours as needed for pain.  10 mL  0  . carbamide peroxide (DEBROX) 6.5 % otic solution Place 5 drops into both ears 2 (two) times daily.  15 mL  0  . carbamide peroxide (DEBROX) 6.5 % otic solution Place 5 drops into the left ear 2 (two) times daily.  15 mL  0   No Known Allergies  ROS: Pertinent items in HPI  OBJECTIVE Blood pressure 134/90, pulse 104, temperature 97.9 F (36.6 C), temperature source Oral,  resp. rate 20, height 5' 9.5" (1.765 m), weight 152.862 kg (337 lb), last menstrual period 09/02/2012, SpO2 100.00%. GENERAL: Well-developed, well-nourished female in no acute distress.  HEENT: Normocephalic HEART: normal rate RESP: normal effort ABDOMEN: Soft, non-tender. Pos BS x 4, hyperactive.   EXTREMITIES: Nontender, no edema NEURO: Alert and oriented SPECULUM EXAM: Deferred  LAB RESULTS Results for orders placed during the hospital encounter of 03/03/13 (from the past 168 hour(s))  URINALYSIS, ROUTINE W REFLEX MICROSCOPIC   Collection Time    03/03/13  1:19 AM      Result Value Range   Color, Urine YELLOW  YELLOW   APPearance CLEAR  CLEAR   Specific Gravity, Urine >1.030 (*) 1.005 - 1.030   pH 6.0  5.0 - 8.0   Glucose, UA NEGATIVE  NEGATIVE mg/dL   Hgb urine dipstick TRACE (*) NEGATIVE   Bilirubin Urine NEGATIVE  NEGATIVE   Ketones, ur NEGATIVE  NEGATIVE mg/dL   Protein, ur NEGATIVE  NEGATIVE mg/dL   Urobilinogen, UA 0.2  0.0 - 1.0 mg/dL   Nitrite NEGATIVE  NEGATIVE   Leukocytes, UA NEGATIVE  NEGATIVE  URINE MICROSCOPIC-ADD ON   Collection Time    03/03/13  1:19 AM      Result Value Range   Squamous Epithelial /  LPF FEW (*) RARE   WBC, UA 0-2  <3 WBC/hpf   RBC / HPF 0-2  <3 RBC/hpf   Bacteria, UA RARE  RARE   Urine-Other MUCOUS PRESENT    POCT PREGNANCY, URINE   Collection Time    03/03/13  1:59 AM      Result Value Range   Preg Test, Ur NEGATIVE  NEGATIVE   IMAGING No results found.  MAU COURSE Nausea resolved w/ Zofran ODT. Tolerating POs.  ASSESSMENT 1. Viral gastroenteritis    PLAN Discharge home. Advance diet slowly. Work note given.     Follow-up Information   Follow up with Primary care provider. (As needed if symptoms worsen)        Medication List    TAKE these medications       antipyrine-benzocaine otic solution  Commonly known as:  AURALGAN  Place 3 drops into both ears every 2 (two) hours as needed for pain.     carbamide  peroxide 6.5 % otic solution  Commonly known as:  DEBROX  Place 5 drops into both ears 2 (two) times daily.     carbamide peroxide 6.5 % otic solution  Commonly known as:  DEBROX  Place 5 drops into the left ear 2 (two) times daily.     ondansetron 4 MG tablet  Commonly known as:  ZOFRAN  Take 1 tablet (4 mg total) by mouth every 8 (eight) hours as needed for nausea.     triamterene-hydrochlorothiazide 37.5-25 MG per capsule  Commonly known as:  DYAZIDE  Take 1 capsule by mouth every morning.       [place], CNM 03/03/2013  3:13 AM

## 2013-03-03 NOTE — MAU Note (Signed)
Pt reports abd pain and vomiting earlier today, none x 4 hours. Denies fever and diarrhea/

## 2013-03-04 NOTE — MAU Provider Note (Signed)
Attestation of Attending Supervision of Advanced Practitioner (PA/CNM/NP): Evaluation and management procedures were performed by the Advanced Practitioner under my supervision and collaboration.  I have reviewed the Advanced Practitioner's note and chart, and I agree with the management and plan.  UGONNA  ANYANWU, MD, FACOG Attending Obstetrician & Gynecologist Faculty Practice, [facility]   

## 2013-03-30 ENCOUNTER — Encounter (HOSPITAL_COMMUNITY): Payer: Self-pay

## 2013-03-30 ENCOUNTER — Emergency Department (HOSPITAL_COMMUNITY)
Admission: EM | Admit: 2013-03-30 | Discharge: 2013-03-30 | Disposition: A | Discharge status: Home / Self Care | Payer: Self-pay | Attending: Emergency Medicine | Admitting: Emergency Medicine

## 2013-03-30 DIAGNOSIS — I10 Essential (primary) hypertension: Secondary | ICD-10-CM | POA: Insufficient documentation

## 2013-03-30 DIAGNOSIS — Z79899 Other long term (current) drug therapy: Secondary | ICD-10-CM | POA: Insufficient documentation

## 2013-03-30 DIAGNOSIS — R059 Cough, unspecified: Secondary | ICD-10-CM | POA: Insufficient documentation

## 2013-03-30 DIAGNOSIS — E785 Hyperlipidemia, unspecified: Secondary | ICD-10-CM | POA: Insufficient documentation

## 2013-03-30 DIAGNOSIS — R509 Fever, unspecified: Secondary | ICD-10-CM | POA: Insufficient documentation

## 2013-03-30 DIAGNOSIS — H6120 Impacted cerumen, unspecified ear: Secondary | ICD-10-CM | POA: Insufficient documentation

## 2013-03-30 DIAGNOSIS — H9209 Otalgia, unspecified ear: Secondary | ICD-10-CM | POA: Insufficient documentation

## 2013-03-30 DIAGNOSIS — R05 Cough: Secondary | ICD-10-CM | POA: Insufficient documentation

## 2013-03-30 DIAGNOSIS — J069 Acute upper respiratory infection, unspecified: Secondary | ICD-10-CM | POA: Insufficient documentation

## 2013-03-30 DIAGNOSIS — G4730 Sleep apnea, unspecified: Secondary | ICD-10-CM | POA: Insufficient documentation

## 2013-03-30 DIAGNOSIS — H9201 Otalgia, right ear: Secondary | ICD-10-CM

## 2013-03-30 DIAGNOSIS — H6121 Impacted cerumen, right ear: Secondary | ICD-10-CM

## 2013-03-30 MED ORDER — CARBAMIDE PEROXIDE 6.5 % OT SOLN: 5.0000 [drp] | Freq: Two times a day (BID) | OTIC | Status: DC

## 2013-03-30 NOTE — ED Provider Notes (Signed)
History    This chart was scribed for non-physician practitioner Glade Nurse PA-C working with Gilda Crease, * by Donne Anon, ED Scribe. This patient was seen in room WTR9/WTR9 and the patient's care was started at 1550.   CSN: [id number]  Arrival date & time 03/30/13  1533   None     No chief complaint on file.    The history is provided by the patient. No language interpreter was used.   HPI Comments: Grace Daniels is a 22 y.o. female with hx of otalgia, seen 3 times in past month, who presents to the Emergency Department complaining of gradual onset, constant, moderate right sided otalgia which began 1 week ago. She reports associated subjective fever and productive cough which is worse at night. She denies nausea, vomiting, jaw claudication, or any other pain. She reports she went swimming 2 days ago, but states this did not worsen the otalgia.   Her OB/PCP is Dr. Okey Dupre.  Past Medical History  Diagnosis Date  . Hypertension   . Hyperlipidemia   . Sleep apnea     No past surgical history on file.  No family history on file.  History  Substance Use Topics  . Smoking status: Never Smoker   . Smokeless tobacco: Never Used  . Alcohol Use: No    OB History   Grav Para Term Preterm Abortions TAB SAB Ect Mult Living   0               Review of Systems  Constitutional: Positive for fever. Negative for chills and diaphoresis.       Subjective fever  HENT: Positive for ear pain. Negative for congestion, rhinorrhea, neck pain and neck stiffness.        Right ear  Eyes: Negative for visual disturbance.  Respiratory: Positive for cough. Negative for apnea, chest tightness and shortness of breath.        Pt states both dry and wet, worse at night  Cardiovascular: Negative for chest pain and palpitations.  Gastrointestinal: Negative for nausea, vomiting, diarrhea and constipation.  Genitourinary: Negative for dysuria.  Musculoskeletal: Negative for gait  problem.  Skin: Negative for rash.  Neurological: Negative for dizziness, weakness, light-headedness, numbness and headaches.    Allergies  Review of patient's allergies indicates no known allergies.  Home Medications   Current Outpatient Rx  Name  Route  Sig  Dispense  Refill  . carbamide peroxide (DEBROX) 6.5 % otic solution   Both Ears   Place 5 drops into both ears 2 (two) times daily.   15 mL   0   . triamterene-hydrochlorothiazide (DYAZIDE) 37.5-25 MG per capsule   Oral   Take 1 capsule by mouth every morning.             BP 148/83  Pulse 104  Temp(Src) 98 F (36.7 C) (Oral)  Resp 22  SpO2 100%  LMP 09/02/2012  Physical Exam  Nursing note and vitals reviewed. Constitutional: She is oriented to person, place, and time. She appears well-developed and well-nourished. No distress.  HENT:  Head: Normocephalic and atraumatic.  Right Ear: Hearing normal. There is tenderness. No drainage or swelling. No foreign bodies. Tympanic membrane is not perforated, not erythematous and not bulging. No decreased hearing is noted.  Left Ear: Hearing and tympanic membrane normal.  Light reflex pearly grey right TM.   Eyes: Conjunctivae and EOM are normal.  Neck: Normal range of motion. Neck supple.  No meningeal signs  Cardiovascular: Normal rate, regular rhythm and normal heart sounds.  Exam reveals no gallop and no friction rub.   No murmur heard. Pulmonary/Chest: Effort normal and breath sounds normal. No respiratory distress. She has no wheezes. She has no rales. She exhibits no tenderness.  Abdominal: Soft. Bowel sounds are normal. She exhibits no distension. There is no tenderness. There is no rebound and no guarding.  Musculoskeletal: Normal range of motion. She exhibits no edema and no tenderness.  Neurological: She is alert and oriented to person, place, and time. No cranial nerve deficit.  Skin: Skin is warm and dry. She is not diaphoretic. No erythema.    ED Course   Procedures (including critical care time) DIAGNOSTIC STUDIES: Oxygen Saturation is 100% on room air, normal by my interpretation.    COORDINATION OF CARE: 3:50 PM Discussed treatment plan which includes removing the wax with pt at bedside and pt agreed to plan. Advised pt to discontinue Q tip use. Advised pt of necessity of following up with ENT specialist due to past history. Will include [facility] resource. Return precautions advised.    4:42 PM Rechecked pt. After I and the RN cleaned and irrigated the ear there was still copious wax present, though TM was partially visible, pearly gray, light reflex intact. Reiterated necessity of ENT follow up.   Labs Reviewed - No data to display No results found. New Prescriptions   CARBAMIDE PEROXIDE (DEBROX) 6.5 % OTIC SOLUTION    Place 5 drops into both ears 2 (two) times daily.     1. Otalgia of right ear   2. Cerumen impaction, right   3. Acute URI       MDM  Pt is afebrile. Pt has hx of cerumen impaction that has caused previous ear pain. Exam non concerning for mastoiditis, cellulitis or malignant OE. Ear wick placed in ED. Dc with ofloxacin script.   Recommend pt to see ENT due to multiple ear related visits in a short period of time and the copious amount of cerumen she seems to build up as well. Tried to remove as much cerumen as possible to visualize the right TM. Pt was uncomfortable with curette, so will ask nurse to irrigate. Discussed with pt to never use a Q tip in her ear and to use the Debrox as instructed in previous visits. Pt states she never filled the script for Debrox. Explained she can buy this over the counter, but will write script as well.   Nurse did irrigate the ear where I could partially visualize the right TM which was pearly gray with light reflext intact. Not likely infected.   Other symptoms are consistent with URI, likely viral etiology. Lungs are CTA. No rhinorrhea or cough appreciated on exam. Discussed that  antibiotics are not indicated for viral infections. Pt will be discharged with symptomatic treatment.  Verbalizes understanding and is agreeable with plan. Pt is hemodynamically stable & in NAD prior to dc.         Glade Nurse, PA-C 03/30/13 1752

## 2013-03-31 NOTE — ED Provider Notes (Signed)
Medical screening examination/treatment/procedure(s) were performed by non-physician practitioner and as supervising physician I was immediately available for consultation/collaboration.   Gilda Crease, MD 03/31/13 [phone]

## 2013-05-01 ENCOUNTER — Emergency Department (HOSPITAL_COMMUNITY)
Admission: EM | Admit: 2013-05-01 | Discharge: 2013-05-01 | Disposition: A | Discharge status: Home / Self Care | Payer: Self-pay | Attending: Emergency Medicine | Admitting: Emergency Medicine

## 2013-05-01 ENCOUNTER — Encounter (HOSPITAL_COMMUNITY): Payer: Self-pay | Admitting: *Deleted

## 2013-05-01 DIAGNOSIS — Z862 Personal history of diseases of the blood and blood-forming organs and certain disorders involving the immune mechanism: Secondary | ICD-10-CM | POA: Insufficient documentation

## 2013-05-01 DIAGNOSIS — Z79899 Other long term (current) drug therapy: Secondary | ICD-10-CM | POA: Insufficient documentation

## 2013-05-01 DIAGNOSIS — H6123 Impacted cerumen, bilateral: Secondary | ICD-10-CM

## 2013-05-01 DIAGNOSIS — I10 Essential (primary) hypertension: Secondary | ICD-10-CM | POA: Insufficient documentation

## 2013-05-01 DIAGNOSIS — Z8669 Personal history of other diseases of the nervous system and sense organs: Secondary | ICD-10-CM | POA: Insufficient documentation

## 2013-05-01 DIAGNOSIS — H6120 Impacted cerumen, unspecified ear: Secondary | ICD-10-CM | POA: Insufficient documentation

## 2013-05-01 DIAGNOSIS — Z8639 Personal history of other endocrine, nutritional and metabolic disease: Secondary | ICD-10-CM | POA: Insufficient documentation

## 2013-05-01 NOTE — ED Notes (Signed)
Lt ear pain for one week

## 2013-05-01 NOTE — ED Notes (Signed)
C/o L earache. Some muffled hearing, h/o same in both ears. H/o wax impaction. Denies issues with R ear tonight. States, "has finished previous abx gtts, is using debrox, is not using Qtips, has not followed up with ENT", wax noted in L ear, (denies: nv, dizziness, fever or other sx).

## 2013-05-01 NOTE — ED Provider Notes (Signed)
Medical screening examination/treatment/procedure(s) were performed by non-physician practitioner and as supervising physician I was immediately available for consultation/collaboration.  Jasmine Awe, MD 05/01/13 [phone]

## 2013-05-01 NOTE — ED Provider Notes (Signed)
History     CSN: [id number]  Arrival date & time 05/01/13  0009   First MD Initiated Contact with Patient 05/01/13 0147      Chief Complaint  Patient presents with  . Otalgia    (Consider location/radiation/quality/duration/timing/severity/associated sxs/prior treatment) Patient is a 22 y.o. female presenting with ear pain. The history is provided by the patient. No language interpreter was used.  Otalgia Location:  Left Associated symptoms: no congestion, no fever and no sore throat   Associated symptoms comment:  Recurrent left ear pain like previous episodes of cerumen impaction. No fever, sore throat, congestion.   Past Medical History  Diagnosis Date  . Hypertension   . Hyperlipidemia   . Sleep apnea     History reviewed. No pertinent past surgical history.  No family history on file.  History  Substance Use Topics  . Smoking status: Never Smoker   . Smokeless tobacco: Never Used  . Alcohol Use: No    OB History   Grav Para Term Preterm Abortions TAB SAB Ect Mult Living   0               Review of Systems  Constitutional: Negative for fever.  HENT: Positive for ear pain. Negative for congestion and sore throat.     Allergies  Review of patient's allergies indicates no known allergies.  Home Medications   Current Outpatient Rx  Name  Route  Sig  Dispense  Refill  . carbamide peroxide (DEBROX) 6.5 % otic solution   Both Ears   Place 5 drops into both ears 2 (two) times daily.   15 mL   0   . triamterene-hydrochlorothiazide (DYAZIDE) 37.5-25 MG per capsule   Oral   Take 1 capsule by mouth every morning.             BP 169/101  Temp(Src) 98.1 F (36.7 C) (Oral)  Resp 20  SpO2 96%  Physical Exam  Constitutional: She appears well-developed and well-nourished. No distress.  HENT:  Cerumen impaction bilaterally. No swelling or redness of external canals. No adenopathy.   Skin: Skin is warm and dry.    ED Course  Procedures (including  critical care time)  Labs Reviewed - No data to display No results found.   No diagnosis found.  1 cerumen impaction   MDM  Cerumen impaction with otalgia on left. No evidence for infection. Will refer to primary care and encourage regular use Debrox.        Arnoldo Hooker, PA-C 05/01/13 [phone]

## 2014-04-14 ENCOUNTER — Emergency Department (HOSPITAL_COMMUNITY): Payer: Self-pay

## 2014-04-14 ENCOUNTER — Encounter (HOSPITAL_COMMUNITY): Payer: Self-pay | Admitting: Emergency Medicine

## 2014-04-14 ENCOUNTER — Emergency Department (HOSPITAL_COMMUNITY)
Admission: EM | Admit: 2014-04-14 | Discharge: 2014-04-14 | Disposition: A | Discharge status: Home / Self Care | Payer: Self-pay | Attending: Emergency Medicine | Admitting: Emergency Medicine

## 2014-04-14 DIAGNOSIS — I10 Essential (primary) hypertension: Secondary | ICD-10-CM | POA: Insufficient documentation

## 2014-04-14 DIAGNOSIS — R609 Edema, unspecified: Secondary | ICD-10-CM | POA: Insufficient documentation

## 2014-04-14 DIAGNOSIS — R109 Unspecified abdominal pain: Secondary | ICD-10-CM | POA: Insufficient documentation

## 2014-04-14 DIAGNOSIS — Z862 Personal history of diseases of the blood and blood-forming organs and certain disorders involving the immune mechanism: Secondary | ICD-10-CM | POA: Insufficient documentation

## 2014-04-14 DIAGNOSIS — Z8669 Personal history of other diseases of the nervous system and sense organs: Secondary | ICD-10-CM | POA: Insufficient documentation

## 2014-04-14 DIAGNOSIS — Z79899 Other long term (current) drug therapy: Secondary | ICD-10-CM | POA: Insufficient documentation

## 2014-04-14 DIAGNOSIS — Z8639 Personal history of other endocrine, nutritional and metabolic disease: Secondary | ICD-10-CM | POA: Insufficient documentation

## 2014-04-14 DIAGNOSIS — Z3202 Encounter for pregnancy test, result negative: Secondary | ICD-10-CM | POA: Insufficient documentation

## 2014-04-14 DIAGNOSIS — R079 Chest pain, unspecified: Secondary | ICD-10-CM | POA: Insufficient documentation

## 2014-04-14 LAB — CBC
HEMATOCRIT: 36.1 % (ref 36.0–46.0)
HEMOGLOBIN: 11.6 g/dL — AB (ref 12.0–15.0)
MCH: 21 pg — ABNORMAL LOW (ref 26.0–34.0)
MCHC: 32.1 g/dL (ref 30.0–36.0)
MCV: 65.4 fL — AB (ref 78.0–100.0)
Platelets: 400 10*3/uL (ref 150–400)
RBC: 5.52 MIL/uL — ABNORMAL HIGH (ref 3.87–5.11)
RDW: 16.2 % — ABNORMAL HIGH (ref 11.5–15.5)
WBC: 11.3 10*3/uL — ABNORMAL HIGH (ref 4.0–10.5)

## 2014-04-14 LAB — HEPATIC FUNCTION PANEL
ALK PHOS: 61 U/L (ref 39–117)
ALT: 13 U/L (ref 0–35)
AST: 15 U/L (ref 0–37)
Albumin: 3.9 g/dL (ref 3.5–5.2)
Bilirubin, Direct: <0.2 mg/dL (ref 0.0–0.3)
TOTAL PROTEIN: 8.6 g/dL — AB (ref 6.0–8.3)
Total Bilirubin: <0.2 mg/dL — ABNORMAL LOW (ref 0.3–1.2)

## 2014-04-14 LAB — URINE MICROSCOPIC-ADD ON

## 2014-04-14 LAB — BASIC METABOLIC PANEL
BUN: 10 mg/dL (ref 6–23)
CHLORIDE: 102 meq/L (ref 96–112)
CO2: 24 mEq/L (ref 19–32)
CREATININE: 0.76 mg/dL (ref 0.50–1.10)
Calcium: 10 mg/dL (ref 8.4–10.5)
GFR calc Af Amer: >90 mL/min (ref >90)
GLUCOSE: 90 mg/dL (ref 70–99)
POTASSIUM: 4.4 meq/L (ref 3.7–5.3)
Sodium: 138 mEq/L (ref 137–147)

## 2014-04-14 LAB — URINALYSIS, ROUTINE W REFLEX MICROSCOPIC
BILIRUBIN URINE: NEGATIVE
Glucose, UA: NEGATIVE mg/dL
KETONES UR: NEGATIVE mg/dL
Leukocytes, UA: NEGATIVE
Nitrite: NEGATIVE
PH: 6 (ref 5.0–8.0)
Protein, ur: NEGATIVE mg/dL
SPECIFIC GRAVITY, URINE: 1.027 (ref 1.005–1.030)
Urobilinogen, UA: 1 mg/dL (ref 0.0–1.0)

## 2014-04-14 LAB — I-STAT TROPONIN, ED: Troponin i, poc: 0.01 ng/mL (ref 0.00–0.08)

## 2014-04-14 LAB — PRO B NATRIURETIC PEPTIDE

## 2014-04-14 LAB — PREGNANCY, URINE: PREG TEST UR: NEGATIVE

## 2014-04-14 MED ORDER — FUROSEMIDE 40 MG PO TABS
40.0000 mg | ORAL_TABLET | Freq: Once | ORAL | Status: AC
  Administered 2014-04-14: 40 mg via ORAL
  Filled 2014-04-14: qty 1

## 2014-04-14 MED ORDER — GI COCKTAIL ~~LOC~~
30.0000 mL | Freq: Once | ORAL | Status: AC
  Administered 2014-04-14: 30 mL via ORAL
  Filled 2014-04-14: qty 30

## 2014-04-14 NOTE — ED Notes (Signed)
Pt to xray

## 2014-04-14 NOTE — Discharge Instructions (Signed)
Chest Pain (Nonspecific) °It is often hard to give a specific diagnosis for the cause of chest pain. There is always a chance that your pain could be related to something serious, such as a heart attack or a blood clot in the lungs. You need to follow up with your caregiver for further evaluation. °CAUSES  °· Heartburn. °· Pneumonia or bronchitis. °· Anxiety or stress. °· Inflammation around your heart (pericarditis) or lung (pleuritis or pleurisy). °· A blood clot in the lung. °· A collapsed lung (pneumothorax). It can develop suddenly on its own (spontaneous pneumothorax) or from injury (trauma) to the chest. °· Shingles infection (herpes zoster virus). °The chest wall is composed of bones, muscles, and cartilage. Any of these can be the source of the pain. °· The bones can be bruised by injury. °· The muscles or cartilage can be strained by coughing or overwork. °· The cartilage can be affected by inflammation and become sore (costochondritis). °DIAGNOSIS  °Lab tests or other studies, such as X-rays, electrocardiography, stress testing, or cardiac imaging, may be needed to find the cause of your pain.  °TREATMENT  °· Treatment depends on what may be causing your chest pain. Treatment may include: °· Acid blockers for heartburn. °· Anti-inflammatory medicine. °· Pain medicine for inflammatory conditions. °· Antibiotics if an infection is present. °· You may be advised to change lifestyle habits. This includes stopping smoking and avoiding alcohol, caffeine, and chocolate. °· You may be advised to keep your head raised (elevated) when sleeping. This reduces the chance of acid going backward from your stomach into your esophagus. °· Most of the time, nonspecific chest pain will improve within 2 to 3 days with rest and mild pain medicine. °HOME CARE INSTRUCTIONS  °· If antibiotics were prescribed, take your antibiotics as directed. Finish them even if you start to feel better. °· For the next few days, avoid physical  activities that bring on chest pain. Continue physical activities as directed. °· Do not smoke. °· Avoid drinking alcohol. °· Only take over-the-counter or prescription medicine for pain, discomfort, or fever as directed by your caregiver. °· Follow your caregiver's suggestions for further testing if your chest pain does not go away. °· Keep any follow-up appointments you made. If you do not go to an appointment, you could develop lasting (chronic) problems with pain. If there is any problem keeping an appointment, you must call to reschedule. °SEEK MEDICAL CARE IF:  °· You think you are having problems from the medicine you are taking. Read your medicine instructions carefully. °· Your chest pain does not go away, even after treatment. °· You develop a rash with blisters on your chest. °SEEK IMMEDIATE MEDICAL CARE IF:  °· You have increased chest pain or pain that spreads to your arm, neck, jaw, back, or abdomen. °· You develop shortness of breath, an increasing cough, or you are coughing up blood. °· You have severe back or abdominal pain, feel nauseous, or vomit. °· You develop severe weakness, fainting, or chills. °· You have a fever. °THIS IS AN EMERGENCY. Do not wait to see if the pain will go away. Get medical help at once. Call your local emergency services (911 in [place] Do not drive yourself to the hospital. °MAKE SURE YOU:  °· Understand these instructions. °· Will watch your condition. °· Will get help right away if you are not doing well or get worse. °Document Released: 08/12/2005 Document Revised: 01/25/2012 Document Reviewed: 06/07/2008 °ExitCare® Patient Information ©2014 [employer],   LLC. ° ° ° °Emergency Department Resource Guide °1) Find a Doctor and Pay Out of Pocket °Although you won't have to find out who is covered by your insurance plan, it is a good idea to ask around and get recommendations. You will then need to call the office and see if the doctor you have chosen will accept you as a new  patient and what types of options they offer for patients who are self-pay. Some doctors offer discounts or will set up payment plans for their patients who do not have insurance, but you will need to ask so you aren't surprised when you get to your appointment. ° °2) Contact Your Local Health Department °Not all health departments have doctors that can see patients for sick visits, but many do, so it is worth a call to see if yours does. If you don't know where your local health department is, you can check in your phone book. The CDC also has a tool to help you locate your state's health department, and many state websites also have listings of all of their local health departments. ° °3) Find a Walk-in Clinic °If your illness is not likely to be very severe or complicated, you may want to try a walk in clinic. These are popping up all over the country in pharmacies, drugstores, and shopping centers. They're usually staffed by nurse practitioners or physician assistants that have been trained to treat common illnesses and complaints. They're usually fairly quick and inexpensive. However, if you have serious medical issues or chronic medical problems, these are probably not your best option. ° °No Primary Care Doctor: °- Call [facility] at  832-8000 - they can help you locate a primary care doctor that  accepts your insurance, provides certain services, etc. °- Physician Referral Service- 1-800-533-3463 ° °Chronic Pain Problems: °Organization         Address  Phone   Notes  °[facility]  (336) 297-2271 Patients need to be referred by their primary care doctor.  ° °Medication Assistance: °Organization         Address  Phone   Notes  °[place] [facility] [address] °[place], [place] [address] (336) 641-8030 --Must be a resident of [place] °-- Must have NO insurance coverage whatsoever (no Medicaid/ Medicare, etc.) °-- The pt. MUST have a primary  care doctor that directs their care regularly and follows them in the community °  °[facility]  (866) 331-1348   °[employer]  (888) 892-1162   ° °Agencies that provide inexpensive medical care: °Organization         Address  Phone   Notes  °[facility]  (336) 832-8035   °[facility]    (336) 832-7272   °[facility] [address] °[place], [place] [address] (336) 832-4777   °[facility] [address], °[place] (336) 271-4999   °[facility]    (336) 373-0678   °[facility]    (336) 272-1050   °[facility] ° [address], [place] Phone:  (336) 832-4444, Fax:  (336) 832-4440 Hours of Operation:  9 am - 6 pm, M-F.  Also accepts Medicaid/Medicare and self-pay.  °[facility] ° [address], [place] Phone: (336) 832-3150, Fax: (336) 832-3151. Hours of Operation:  8:30 am - 5:30 pm, M-F.  Also accepts Medicaid and self-pay.  °  [facility] [place] [address], [place] Phone: (336) 878-6027   °[facility] [address], [place], [place] (336)723-1848, Ext. 123 Mondays & Thursdays: 7-9 AM.  First 15 patients are seen on a first come, first serve basis. °  ° °Medicaid-accepting [place] Providers: ° °Organization         Address  Phone   Notes  °[facility] [address], [place] (336) 641-2100 Also accepts self-pay patients.  °[facility] [address], [place] ° (336) 856-9996   °[facility] [address], [place] (336) 288-8857   °[facility] [address] [place] [address], [place] (336) 299-7000   °Veita Bland [address], [place]  ° (336) 373-1557 Only accepts [place] Access Medicaid patients after they have their name applied to their card.  ° °Self-Pay (no insurance) in [place]: ° °Organization         Address  Phone   Notes  °Sickle Cell Patients, [facility] [address], [place] (336) 832-1970   °[facility] [address], [place] (336) 832-4400   °[facility] ° [address], [place] (336) 992-4800   °[facility]/Dr. Osei-Bonsu ° [address] [place] [address], [place] or [address], [place] (336) 841-8500 Phone number for both [place] and [place] locations is the same.  °[facility] [address], [place] (336) 299-0000   °[facility] [address] [place] [address], [place] or [address] (336) 852-7530 °(336) 878-2260   °[facility] [address], [place] (336) 350-1642, phone; (336) 294-5005, fax Sees patients 1st and 3rd Saturday of every month.  Must not qualify for public or private insurance (i.e. Medicaid, Medicare, [place] Health Choice, Veterans' Benefits) • Household income should be no more than 200% of the poverty level •The clinic cannot treat you if you are pregnant or think you are pregnant • Sexually transmitted diseases are not treated at the clinic.  ° ° °Dental Care: °Organization         Address  Phone  Notes  °[facility] [address], [place] (336) 641-6152 Accepts children up to age 21 who are enrolled in Medicaid or [place] Health Choice; pregnant women with a Medicaid card; and children who have applied for Medicaid or [place] Health Choice, but were declined, whose parents can pay a reduced fee at time of service.  °[facility] [place]  [address], [place] (336) 641-7733 Accepts children up to age 21 who are enrolled in Medicaid or [place] Health Choice; pregnant women with a Medicaid card; and children who have applied for Medicaid or [place] Health Choice, but were declined, whose parents can  pay a reduced fee at time of service.  °[facility] ° [address], [place] (336) 641-4533 Patients are seen by appointment only. Walk-ins are not accepted. [facility] will see patients 18 years of age and older. °Monday - Tuesday (8am-5pm) °Most Wednesdays (8:30-5pm) °$30 per visit, cash only  °[facility] ° [address], [place] (336) 641-4533 Patients are seen by appointment only. Walk-ins are not accepted. [facility] will see patients 18 years of   age and older. °One Wednesday Evening (Monthly: Volunteer Based).  $30 per visit, cash only  °[facility]  (919) 537-3737 for adults; Children under age 4, call Graduate Pediatric Dentistry at (919) [phone]. Children aged 4-14, please call (919) [phone] to request a pediatric application. ° Dental services are provided in all areas of dental care including fillings, crowns and bridges, complete and partial dentures, implants, gum treatment, root canals, and extractions. Preventive care is also provided. Treatment is provided to both adults and children. °Patients are selected via a lottery and there is often a waiting list. °  °[facility] [address], °[place] ° (336) 763-8833 [email or web address] °  °[facility] [address], [place], [place] (336)723-1848, Ext. 123 Second and Fourth Thursday of each month, opens at 6:30 AM; Clinic ends at 9 AM.  Patients are seen on a first-come first-served basis, and a limited number are seen during each clinic.  ° °[facility] ° [address], [place], [place] (336) 723-7904   Eligibility Requirements °You must have lived in [place], [place], or [place] counties for at least the last three months. °  You cannot be eligible for state or federal sponsored healthcare insurance, including Veterans Administration, Medicaid, or Medicare. °  You generally cannot be eligible for healthcare  insurance through your employer.  °  How to apply: °Eligibility screenings are held every Tuesday and Wednesday afternoon from 1:00 pm until 4:00 pm. You do not need an appointment for the interview!  °[facility] [address], [place], [place] 336-631-2330   °[facility]  336-342-8273   °[facility]  336-703-3100   °[facility]  336-570-6415   ° °Behavioral Health Resources in the Community: °Intensive Outpatient Programs °Organization         Address  Phone  Notes  °[facility] [address], [place], [place] 336-878-6098   °[facility] [address], [place], [place] 336-832-9800   °[facility] [address], [place], [place] ° 336-882-2125   °[facility] [address],  °[place], [place] 1-800-853-5163 or 336-641-4981   °Substance Abuse Resources °Organization         Address  Phone  Notes  °[facility]  336-882-2125   °[facility]  336-784-9470   °[facility]  336-285-9073   °[facility]  336-845-3988   °Residential & Outpatient Substance Abuse Program  1-800-659-3381   °Psychological Services °Organization         Address  Phone  Notes  °[facility]  336- 832-9600   °[facility]  336- 378-7881   °[facility] [address], [place] 1-800-853-5163 or 336-641-4981   ° °Mobile Crisis Teams °Organization         Address  Phone  Notes  °[facility], Mobile Crisis Care Unit  1-877-626-1772   °Assertive °[facility] ° [address] [place], [place] 336-834-9664   °Sharon DeEsch [address] °[place] 336-554-5454   ° °Self-Help/Support Groups °Organization         Address  Phone             Notes  °[facility]  - variety of support groups  336- 373-1402 Call for more information  °Narcotics Anonymous (NA),  [facility] [address], °[place]  2   meetings at this location  ° °Residential Treatment Programs °Organization         Address  Phone  Notes  °[facility] [address],    °[place]  1-866-801-8205   °[facility] ° [address], [place], [place] 704-293-8524   °[facility] [address], [place] 336-845-3988 Admissions: 8am-3pm M-F  °[facility] [address]    °[place], [place] 336-841-1104   °[facility] [address], [place], [place] 336-379-7146   °[facility] [address]  °[place], [place] 336-285-9073   °[facility] Intensive Outpatient [address], [place], [place] 336-852-3033   °[facility] [address]  °[place], [place] 1-877-615-2722 or 336-784-9470   °[facility] [address] [place], [place] 336-227-7417 Accepts Medicaid  °[facility] [address]  °[place] 1-800-659-3381 Substance Abuse/Addiction Treatment  ° °[place] Behavioral Health Resources °Organization         Address  Phone  Notes  °[facility]  (888) 581-9988   °Julie Brannon, PhD [address] [place], [place]   (336) 349-5553 or (336) 951-0000   °[facility]   [address] °[place], [place] (336) 349-4454   °[facility] [address], [place], [place] (336) 342-8316 Insurance/Medicaid/sponsorship through [facility]  °[facility] [address]                                    [place], [place] (336) 342-8316 Therapy/tele-psych/case  °[facility] [address]  ° [place], [place] (336) 349-2233    °Dr. Arfeen  (336) 349-4544   °[facility] [place]  [employer] [place] [facility] 1) [address], [place] °2) [address], [place] °3)  [address], [place] (336) 349-3220 °(336) 342-7768 ° °(336) 342-8140    °[place] [facility] (336) 342-1394 or (336) 342-3537 (After Hours)    ° ° ° °

## 2014-04-14 NOTE — ED Provider Notes (Signed)
CSN: [id number]     Arrival date & time 04/14/14  2013 History   First MD Initiated Contact with Patient 04/14/14 2038     Chief Complaint  Patient presents with  . Chest Pain  . Abdominal Pain     (Consider location/radiation/quality/duration/timing/severity/associated sxs/prior Treatment) Patient is a 23 y.o. female presenting with chest pain and abdominal pain. The history is provided by the patient.  Chest Pain Pain location:  Substernal area Pain quality: burning and sharp   Pain radiates to:  Does not radiate Pain radiates to the back: no   Pain severity:  Mild Onset quality:  Sudden Duration:  20 minutes Timing:  Sporadic Progression:  Worsening (present for several months, sporadic, but pains are worsening over past 2 weeks. happening roughly twice a week) Chronicity:  New Context: at rest   Context: not breathing and no stress   Relieved by:  Nothing Worsened by:  Nothing tried Associated symptoms: abdominal pain   Associated symptoms: no fever and no shortness of breath   Abdominal Pain Associated symptoms: chest pain   Associated symptoms: no fever and no shortness of breath     Past Medical History  Diagnosis Date  . Hypertension   . Hyperlipidemia   . Sleep apnea    History reviewed. No pertinent past surgical history. History reviewed. No pertinent family history. History  Substance Use Topics  . Smoking status: Never Smoker   . Smokeless tobacco: Never Used  . Alcohol Use: No   OB History   Grav Para Term Preterm Abortions TAB SAB Ect Mult Living   0              Review of Systems  Constitutional: Negative for fever.  Respiratory: Negative for shortness of breath.   Cardiovascular: Positive for chest pain and leg swelling.  Gastrointestinal: Positive for abdominal pain.  All other systems reviewed and are negative.     Allergies  Review of patient's allergies indicates no known allergies.  Home Medications   Prior to Admission  medications   Medication Sig Start Date End Date Taking? Authorizing Provider  carbamide peroxide (DEBROX) 6.5 % otic solution Place 5 drops into both ears 2 (two) times daily. 02/11/13   Tiffany Irine Seal, PA-C  triamterene-hydrochlorothiazide (DYAZIDE) 37.5-25 MG per capsule Take 1 capsule by mouth every morning.      Historical Provider, MD   BP 161/89  Pulse 105  Temp(Src) 98.2 F (36.8 C) (Oral)  Resp 18  SpO2 98% Physical Exam  Nursing note and vitals reviewed. Constitutional: She is oriented to person, place, and time. She appears well-developed and well-nourished. No distress.  HENT:  Head: Normocephalic and atraumatic.  Mouth/Throat: Oropharynx is clear and moist. No oropharyngeal exudate.  Eyes: EOM are normal. Pupils are equal, round, and reactive to light.  Neck: Normal range of motion. Neck supple.  Cardiovascular: Normal rate and regular rhythm.  Exam reveals no friction rub.   No murmur heard. Pulmonary/Chest: Effort normal and breath sounds normal. No respiratory distress. She has no wheezes. She has no rales.  Abdominal: Soft. She exhibits no distension. There is no tenderness. There is no rebound.  Musculoskeletal: Normal range of motion. She exhibits edema (1+, bilateral calves).  Neurological: She is alert and oriented to person, place, and time.  Skin: No rash noted. She is not diaphoretic.    ED Course  Procedures (including critical care time) Labs Review Labs Reviewed  CBC  BASIC METABOLIC PANEL  PRO B NATRIURETIC  PEPTIDE  HEPATIC FUNCTION PANEL  I-STAT TROPOININ, ED  I-STAT TROPOININ, ED    Imaging Review No results found.   EKG Interpretation   Date/Time:  Saturday Apr 14 2014 20:24:06 EDT Ventricular Rate:  105 PR Interval:  135 QRS Duration: 85 QT Interval:  333 QTC Calculation: 440 R Axis:   71 Text Interpretation:  Sinus tachycardia No changes from prior Confirmed by  Gwendolyn GrantWALDEN  MD, Sarya Linenberger ([id number]) on 04/14/2014 8:49:57 PM      MDM    Final diagnoses:  Chest pain  Peripheral edema  HTN (hypertension)    53F presents with chest pain. Present for a long time, several months, which she attributes to weight gain. Worse over past 2 weeks. Sporadic - abuot twice a week, no instigating factors. No alleviating or exacerbating factors. Sharp, nonradiating. Self-limited. One episode earlier today. No fevers, no cough. Mild associated nausea, but no SOB, no vomiting. Hx of sleep apnea, HTN, has been noncompliant with CPAP and HTN meds. On exam, 1+ pitting edema to bilateral calves, no rales in lungs. Belly benign. She is obese. CP not c/w PE. Chronic with worsening over past 2 week. Very sporadic. Hx not c/w PE. Patient's leg swelling could be from longstanding HTN, obesity, noncompliance. She just got a job and is in a waiting period for insurance. EKG with sinus tachycardia. Will obtain labs to look for CHF, hypoalbuminemia, ARF.  Labs ok. CXR ok. No further chest pain. Patient stable for discharge, given one dose of PO lasix to help with peripheral edema. Patient instructed to continue her HTN meds and f/u with PCP. Given resource guide.   Dagmar HaitWilliam Anette Barra, MD 04/14/14 [phone]

## 2014-04-14 NOTE — ED Notes (Signed)
Pt arrived to the ED with a  Compliant of chest pain that aggravated her stomach.  Pt states pain is located in the center chest with no radiation.  Pt states she also has an upset stomach that is associated with the chest pain which she stats is intermittent.  Pt also has pedal edema.  Pt states symptoms have been persistent for a month.  Pt has a hx of hypertension

## 2014-08-13 ENCOUNTER — Inpatient Hospital Stay (HOSPITAL_COMMUNITY)
Admission: AD | Admit: 2014-08-13 | Discharge: 2014-08-13 | Disposition: A | Discharge status: Home / Self Care | Payer: Self-pay | Source: Ambulatory Visit | Attending: Family Medicine | Admitting: Family Medicine

## 2014-08-13 ENCOUNTER — Encounter (HOSPITAL_COMMUNITY): Payer: Self-pay | Admitting: *Deleted

## 2014-08-13 DIAGNOSIS — IMO0001 Reserved for inherently not codable concepts without codable children: Secondary | ICD-10-CM | POA: Insufficient documentation

## 2014-08-13 DIAGNOSIS — M7918 Myalgia, other site: Secondary | ICD-10-CM

## 2014-08-13 LAB — URINALYSIS, ROUTINE W REFLEX MICROSCOPIC
BILIRUBIN URINE: NEGATIVE
Glucose, UA: NEGATIVE mg/dL
Hgb urine dipstick: NEGATIVE
Ketones, ur: NEGATIVE mg/dL
Leukocytes, UA: NEGATIVE
NITRITE: NEGATIVE
Protein, ur: NEGATIVE mg/dL
SPECIFIC GRAVITY, URINE: 1.015 (ref 1.005–1.030)
UROBILINOGEN UA: 1 mg/dL (ref 0.0–1.0)
pH: 6.5 (ref 5.0–8.0)

## 2014-08-13 LAB — POCT PREGNANCY, URINE: Preg Test, Ur: NEGATIVE

## 2014-08-13 MED ORDER — CYCLOBENZAPRINE HCL 10 MG PO TABS: 10.0000 mg | ORAL_TABLET | Freq: Two times a day (BID) | ORAL | Status: DC | PRN

## 2014-08-13 MED ORDER — KETOROLAC TROMETHAMINE 60 MG/2ML IM SOLN
60.0000 mg | Freq: Once | INTRAMUSCULAR | Status: AC
  Administered 2014-08-13: 60 mg via INTRAMUSCULAR
  Filled 2014-08-13: qty 2

## 2014-08-13 NOTE — MAU Note (Signed)
Lower back pain X 4 days. States rain makes it throb. States Tylenol does not help, but Goody Powder did give some relief.

## 2014-08-13 NOTE — MAU Provider Note (Signed)
History     CSN: [id number]  Arrival date and time: 08/13/14 8657   First Provider Initiated Contact with Patient 08/13/14 [phone]      Chief Complaint  Patient presents with  . Back Pain   HPI  [name] is a 23 y.o. female G0P0 who presents with lower back pain that started 4 days ago. She has tried over the counter pain medicine with minimal relief. She currently rates her pain 9/10; she took 1 tylenol and cranberry pills this morning. She initially thought this was a UTI.   OB History   Grav Para Term Preterm Abortions TAB SAB Ect Mult Living   0         0      Past Medical History  Diagnosis Date  . Hypertension   . Hyperlipidemia   . Sleep apnea     No past surgical history on file.  No family history on file.  History  Substance Use Topics  . Smoking status: Never Smoker   . Smokeless tobacco: Never Used  . Alcohol Use: No    Allergies: No Known Allergies  Prescriptions prior to admission  Medication Sig Dispense Refill  . acetaminophen (TYLENOL) 500 MG tablet Take 500 mg by mouth every 6 (six) hours as needed for mild pain or moderate pain.      Marland Kitchen triamterene-hydrochlorothiazide (DYAZIDE) 37.5-25 MG per capsule Take 1 capsule by mouth every morning.          Results for orders placed during the hospital encounter of 08/13/14 (from the past 48 hour(s))  URINALYSIS, ROUTINE W REFLEX MICROSCOPIC     Status: None   Collection Time    08/13/14  8:17 AM      Result Value Ref Range   Color, Urine YELLOW  YELLOW   APPearance CLEAR  CLEAR   Specific Gravity, Urine 1.015  1.005 - 1.030   pH 6.5  5.0 - 8.0   Glucose, UA NEGATIVE  NEGATIVE mg/dL   Hgb urine dipstick NEGATIVE  NEGATIVE   Bilirubin Urine NEGATIVE  NEGATIVE   Ketones, ur NEGATIVE  NEGATIVE mg/dL   Protein, ur NEGATIVE  NEGATIVE mg/dL   Urobilinogen, UA 1.0  0.0 - 1.0 mg/dL   Nitrite NEGATIVE  NEGATIVE   Leukocytes, UA NEGATIVE  NEGATIVE   Comment: MICROSCOPIC NOT DONE ON URINES WITH  NEGATIVE PROTEIN, BLOOD, LEUKOCYTES, NITRITE, OR GLUCOSE <1000 mg/dL.  POCT PREGNANCY, URINE     Status: None   Collection Time    08/13/14  8:33 AM      Result Value Ref Range   Preg Test, Ur NEGATIVE  NEGATIVE   Comment:            THE SENSITIVITY OF THIS     METHODOLOGY IS >24 mIU/mL    Review of Systems  Constitutional: Negative for fever and chills.  Gastrointestinal: Negative for nausea, vomiting and abdominal pain.  Genitourinary: Negative for dysuria, urgency, frequency and hematuria.       No vaginal discharge. No vaginal bleeding. No dysuria.   Musculoskeletal: Positive for back pain (Middle back pain ).  Neurological: Positive for headaches.   Physical Exam   Blood pressure 133/71, pulse 83, temperature 98.1 F (36.7 C), temperature source Oral, resp. rate 18, height  (1.753 m), weight 169.192 kg (373 lb).  Physical Exam  Constitutional: She is oriented to person, place, and time. She appears well-developed and well-nourished.  Non-toxic appearance. She does not have a sickly  appearance. She does not appear ill. No distress.  HENT:  Head: Normocephalic.  Eyes: Pupils are equal, round, and reactive to light.  Respiratory: Effort normal.  GI: Soft. There is no CVA tenderness.  Musculoskeletal:       Lumbar back: She exhibits no tenderness and no spasm.  Negative tenderness in lower back  Neurological: She is alert and oriented to person, place, and time.  Skin: Skin is warm. She is not diaphoretic.    MAU Course  Procedures None  MDM UA Upt Toradol 60 mg IM  Patient states her pain is much better following toradol. Unlikely GYN related, more likely musculoskeletal in nature. Urine normal, negative CVA tenderness. Denies abdominal pain or abnormal vaginal discharge, no reason to perform a speculum exam.   Assessment and Plan   A: 1. Musculoskeletal pain    P: Discharge home in stable condition RX: Flexeril  Go to Urgent care if symptoms  persist Patient has a history of hypertension; risks and signs and symptoms of stroke/MI reviewed    Debbrah Alar, NP 08/13/2014 12:52 PM

## 2014-08-13 NOTE — MAU Note (Deleted)
Pt presents to MAU with complaints of lower abdominal pain radiating to her lower back.

## 2014-08-13 NOTE — MAU Provider Note (Signed)
Attestation of Attending Supervision of Advanced Practitioner (PA/CNM/NP): Evaluation and management procedures were performed by the Advanced Practitioner under my supervision and collaboration.  I have reviewed the Advanced Practitioner's note and chart, and I agree with the management and plan.  Lucianna Ostlund, DO Attending Physician Faculty [facility], [facility]  

## 2014-08-13 NOTE — MAU Note (Signed)
Assisted to back via wc. Pt walked to rm from bathroom, without difficulty, gait steady

## 2015-01-30 ENCOUNTER — Encounter (HOSPITAL_COMMUNITY): Payer: Self-pay | Admitting: *Deleted

## 2015-01-30 ENCOUNTER — Emergency Department (HOSPITAL_COMMUNITY): Payer: Self-pay

## 2015-01-30 ENCOUNTER — Emergency Department (HOSPITAL_COMMUNITY)
Admission: EM | Admit: 2015-01-30 | Discharge: 2015-01-30 | Disposition: A | Discharge status: Home / Self Care | Payer: Self-pay | Attending: Emergency Medicine | Admitting: Emergency Medicine

## 2015-01-30 DIAGNOSIS — Z8669 Personal history of other diseases of the nervous system and sense organs: Secondary | ICD-10-CM | POA: Insufficient documentation

## 2015-01-30 DIAGNOSIS — Z8639 Personal history of other endocrine, nutritional and metabolic disease: Secondary | ICD-10-CM | POA: Insufficient documentation

## 2015-01-30 DIAGNOSIS — B349 Viral infection, unspecified: Secondary | ICD-10-CM | POA: Insufficient documentation

## 2015-01-30 DIAGNOSIS — J40 Bronchitis, not specified as acute or chronic: Secondary | ICD-10-CM

## 2015-01-30 DIAGNOSIS — J209 Acute bronchitis, unspecified: Secondary | ICD-10-CM | POA: Insufficient documentation

## 2015-01-30 LAB — URINALYSIS, ROUTINE W REFLEX MICROSCOPIC
Bilirubin Urine: NEGATIVE
GLUCOSE, UA: NEGATIVE mg/dL
Ketones, ur: NEGATIVE mg/dL
Leukocytes, UA: NEGATIVE
Nitrite: NEGATIVE
Protein, ur: NEGATIVE mg/dL
Specific Gravity, Urine: 1.028 (ref 1.005–1.030)
Urobilinogen, UA: 1 mg/dL (ref 0.0–1.0)
pH: 5.5 (ref 5.0–8.0)

## 2015-01-30 LAB — URINE MICROSCOPIC-ADD ON

## 2015-01-30 LAB — BASIC METABOLIC PANEL
Anion gap: 5 (ref 5–15)
BUN: 10 mg/dL (ref 6–23)
CALCIUM: 9.3 mg/dL (ref 8.4–10.5)
CO2: 25 mmol/L (ref 19–32)
Chloride: 107 mmol/L (ref 96–112)
Creatinine, Ser: 0.7 mg/dL (ref 0.50–1.10)
GFR calc Af Amer: >90 mL/min (ref >90)
GFR calc non Af Amer: >90 mL/min (ref >90)
Glucose, Bld: 113 mg/dL — ABNORMAL HIGH (ref 70–99)
POTASSIUM: 3.9 mmol/L (ref 3.5–5.1)
SODIUM: 137 mmol/L (ref 135–145)

## 2015-01-30 LAB — TROPONIN I: Troponin I: <0.03 ng/mL (ref <0.031)

## 2015-01-30 LAB — CBC
HCT: 32.5 % — ABNORMAL LOW (ref 36.0–46.0)
Hemoglobin: 10.5 g/dL — ABNORMAL LOW (ref 12.0–15.0)
MCH: 20.7 pg — AB (ref 26.0–34.0)
MCHC: 32.3 g/dL (ref 30.0–36.0)
MCV: 64 fL — AB (ref 78.0–100.0)
Platelets: 376 10*3/uL (ref 150–400)
RBC: 5.08 MIL/uL (ref 3.87–5.11)
RDW: 17.1 % — ABNORMAL HIGH (ref 11.5–15.5)
WBC: 11.8 10*3/uL — AB (ref 4.0–10.5)

## 2015-01-30 LAB — RAPID STREP SCREEN (MED CTR MEBANE ONLY): STREPTOCOCCUS, GROUP A SCREEN (DIRECT): NEGATIVE

## 2015-01-30 LAB — D-DIMER, QUANTITATIVE: D-Dimer, Quant: <0.27 ug/mL-FEU (ref 0.00–0.48)

## 2015-01-30 LAB — POC URINE PREG, ED: PREG TEST UR: NEGATIVE

## 2015-01-30 MED ORDER — ALBUTEROL SULFATE HFA 108 (90 BASE) MCG/ACT IN AERS
2.0000 | INHALATION_SPRAY | Freq: Once | RESPIRATORY_TRACT | Status: AC
  Administered 2015-01-30: 2 via RESPIRATORY_TRACT
  Filled 2015-01-30: qty 6.7

## 2015-01-30 MED ORDER — SODIUM CHLORIDE 0.9 % IV BOLUS (SEPSIS)
1000.0000 mL | Freq: Once | INTRAVENOUS | Status: AC
Start: 2015-01-30 — End: 2015-01-30
  Administered 2015-01-30: 1000 mL via INTRAVENOUS

## 2015-01-30 MED ORDER — AZITHROMYCIN 250 MG PO TABS: 250.0000 mg | ORAL_TABLET | Freq: Every day | ORAL | Status: DC

## 2015-01-30 NOTE — ED Notes (Signed)
Pt. Has been feeling sore for 2 weeks. Pt has had a cough and chills. Pt states not sure about fever. 7/10 generalized pain

## 2015-01-30 NOTE — ED Provider Notes (Signed)
CSN: [id number]     Arrival date & time 01/30/15  0509 History   First MD Initiated Contact with Patient 01/30/15 [phone]     Chief Complaint  Patient presents with  . Cough  . Fever     (Consider location/radiation/quality/duration/timing/severity/associated sxs/prior Treatment) The history is provided by the patient. No language interpreter was used.  Grace Daniels is a 24 y/o F with PMHx of HTN, HLD, sleep apnea presenting to the ED with cough, right ear pain, generalized bodyaches, subjective fever that has been ongoing for the past 2 weeks. Patient reported that her cough is mainly dry and stated that when she coughs she gets a sharp, pulling pain in her chest. Stated that her throat has been feeling mildly sore. Patient reported that she has been having some nausea, vomiting, diarrhea intermittently. Reported that she has been using Robotussin with minimal relief. Stated that she does have positive sick contacts at work. Last known menstrual period last month. Denied nasal congestion, difficulty swallowing, blurred vision, sudden loss of vision, dizziness, abdominal pain, neck pain, neck stiffness, dysuria, hematuria, vaginal bleeding, vaginal discharge, chills, eye pain, eye discharge, ear discharge, travels, hemoptysis. PCP none  Past Medical History  Diagnosis Date  . Hypertension   . Hyperlipidemia   . Sleep apnea    History reviewed. No pertinent past surgical history. History reviewed. No pertinent family history. History  Substance Use Topics  . Smoking status: Never Smoker   . Smokeless tobacco: Never Used  . Alcohol Use: No   OB History    Gravida Para Term Preterm AB TAB SAB Ectopic Multiple Living   0         0     Review of Systems  Constitutional: Positive for fever (Subjective). Negative for chills.  HENT: Positive for ear pain (Right) and sore throat. Negative for congestion, ear discharge and trouble swallowing.   Respiratory: Positive for cough, chest  tightness and shortness of breath.   Cardiovascular: Negative for chest pain.  Gastrointestinal: Positive for nausea, vomiting and diarrhea. Negative for abdominal pain, constipation, blood in stool and anal bleeding.  Genitourinary: Negative for dysuria, vaginal bleeding, vaginal discharge and vaginal pain.  Neurological: Positive for headaches. Negative for dizziness and weakness.      Allergies  Review of patient's allergies indicates no known allergies.  Home Medications   Prior to Admission medications   Medication Sig Start Date End Date Taking? Authorizing Provider  guaiFENesin (ROBITUSSIN) 100 MG/5ML SOLN Take 5 mLs by mouth every 4 (four) hours as needed for cough or to loosen phlegm.   Yes Historical Provider, MD  triamterene-hydrochlorothiazide (DYAZIDE) 37.5-25 MG per capsule Take 1 capsule by mouth every morning.     Yes Historical Provider, MD  azithromycin (ZITHROMAX) 250 MG tablet Take 1 tablet (250 mg total) by mouth daily. Take first 2 tablets together, then 1 every day until finished. 01/30/15   Jorell Agne, PA-C  cyclobenzaprine (FLEXERIL) 10 MG tablet Take 1 tablet (10 mg total) by mouth 2 (two) times daily as needed for muscle spasms. Patient not taking: Reported on 01/30/2015 08/13/14   Duane Lope, NP   BP 121/60 mmHg  Pulse 72  Temp(Src) 98.9 F (37.2 C) (Oral)  Resp 16  Ht  (1.778 m)  Wt 360 lb (163.295 kg)  BMI 51.65 kg/m2  SpO2 96%  LMP 01/30/2015 Physical Exam  Constitutional: She is oriented to person, place, and time. She appears well-developed and well-nourished. No distress.  HENT:  Head: Normocephalic and atraumatic.  Right Ear: Hearing, tympanic membrane, external ear and ear canal normal. No mastoid tenderness.  Left Ear: Hearing, tympanic membrane, external ear and ear canal normal. No mastoid tenderness.  Mouth/Throat: Oropharynx is clear and moist. No trismus in the jaw. No uvula swelling. No oropharyngeal exudate, posterior  oropharyngeal edema, posterior oropharyngeal erythema or tonsillar abscesses.  Eyes: Conjunctivae and EOM are normal. Pupils are equal, round, and reactive to light. Right eye exhibits no discharge. Left eye exhibits no discharge.  Neck: Normal range of motion. Neck supple. No tracheal deviation present.  Is negative neck stiffness Negative nuchal rigidity Negative cervical lymphadenopathy Negative meningeal signs  Cardiovascular: Normal rate, regular rhythm and normal heart sounds.  Exam reveals no friction rub.   No murmur heard. Pulses:      Radial pulses are 2+ on the right side, and 2+ on the left side.  Pulmonary/Chest: Effort normal and breath sounds normal. No respiratory distress. She has no wheezes. She has no rales. She exhibits tenderness (Mild discomfort upon palpation to the chest wall - muscular nature).  Abdominal: Soft. Bowel sounds are normal. She exhibits no distension. There is no tenderness. There is no rebound and no guarding.  Obese  Musculoskeletal: Normal range of motion.  Lymphadenopathy:    She has no cervical adenopathy.  Neurological: She is alert and oriented to person, place, and time. No cranial nerve deficit. She exhibits normal muscle tone. Coordination normal.  Skin: Skin is warm and dry. No rash noted. She is not diaphoretic. No erythema.  Psychiatric: She has a normal mood and affect. Her behavior is normal. Thought content normal.  Nursing note and vitals reviewed.   ED Course  Procedures (including critical care time)  Results for orders placed or performed during the hospital encounter of 01/30/15  Rapid strep screen  Result Value Ref Range   Streptococcus, Group A Screen (Direct) NEGATIVE NEGATIVE  Urinalysis, Routine w reflex microscopic  Result Value Ref Range   Color, Urine YELLOW YELLOW   APPearance CLOUDY (A) CLEAR   Specific Gravity, Urine 1.028 1.005 - 1.030   pH 5.5 5.0 - 8.0   Glucose, UA NEGATIVE NEGATIVE mg/dL   Hgb urine  dipstick MODERATE (A) NEGATIVE   Bilirubin Urine NEGATIVE NEGATIVE   Ketones, ur NEGATIVE NEGATIVE mg/dL   Protein, ur NEGATIVE NEGATIVE mg/dL   Urobilinogen, UA 1.0 0.0 - 1.0 mg/dL   Nitrite NEGATIVE NEGATIVE   Leukocytes, UA NEGATIVE NEGATIVE  CBC  Result Value Ref Range   WBC 11.8 (H) 4.0 - 10.5 K/uL   RBC 5.08 3.87 - 5.11 MIL/uL   Hemoglobin 10.5 (L) 12.0 - 15.0 g/dL   HCT 16.132.5 (L) 09.636.0 - 04.546.0 %   MCV 64.0 (L) 78.0 - 100.0 fL   MCH 20.7 (L) 26.0 - 34.0 pg   MCHC 32.3 30.0 - 36.0 g/dL   RDW 40.917.1 (H) 81.111.5 - 91.415.5 %   Platelets 376 150 - 400 K/uL  Basic metabolic panel  Result Value Ref Range   Sodium 137 135 - 145 mmol/L   Potassium 3.9 3.5 - 5.1 mmol/L   Chloride 107 96 - 112 mmol/L   CO2 25 19 - 32 mmol/L   Glucose, Bld 113 (H) 70 - 99 mg/dL   BUN 10 6 - 23 mg/dL   Creatinine, Ser 7.820.70 0.50 - 1.10 mg/dL   Calcium 9.3 8.4 - 95.610.5 mg/dL   GFR calc non Af Amer >90 >90 mL/min   GFR calc Af  Amer >90 >90 mL/min   Anion gap 5 5 - 15  Troponin I  Result Value Ref Range   Troponin I <0.03 <0.031 ng/mL  D-dimer, quantitative  Result Value Ref Range   D-Dimer, Quant <0.27 0.00 - 0.48 ug/mL-FEU  Urine microscopic-add on  Result Value Ref Range   Squamous Epithelial / LPF MANY (A) RARE   WBC, UA 0-2 <3 WBC/hpf   RBC / HPF 0-2 <3 RBC/hpf   Bacteria, UA FEW (A) RARE  POC urine preg, ED (not at [facility])  Result Value Ref Range   Preg Test, Ur NEGATIVE NEGATIVE    Labs Review Labs Reviewed  URINALYSIS, ROUTINE W REFLEX MICROSCOPIC - Abnormal; Notable for the following:    APPearance CLOUDY (*)    Hgb urine dipstick MODERATE (*)    All other components within normal limits  CBC - Abnormal; Notable for the following:    WBC 11.8 (*)    Hemoglobin 10.5 (*)    HCT 32.5 (*)    MCV 64.0 (*)    MCH 20.7 (*)    RDW 17.1 (*)    All other components within normal limits  BASIC METABOLIC PANEL - Abnormal; Notable for the following:    Glucose, Bld 113 (*)    All other components  within normal limits  URINE MICROSCOPIC-ADD ON - Abnormal; Notable for the following:    Squamous Epithelial / LPF MANY (*)    Bacteria, UA FEW (*)    All other components within normal limits  RAPID STREP SCREEN  CULTURE, GROUP A STREP  TROPONIN I  D-DIMER, QUANTITATIVE  LIPASE, BLOOD  POC URINE PREG, ED    Imaging Review Dg Chest 2 View  01/30/2015   CLINICAL DATA:  Chest cold and cough. History of hypertension and sleep apnea  EXAM: CHEST  2 VIEW  COMPARISON:  04/14/2014; 11/18/2009; 04/13/2008  FINDINGS: Examination is degraded due to patient body habitus. Unchanged cardiac silhouette and mediastinal contours. No focal parenchymal opacities. No pleural effusion or pneumothorax. No evidence of edema. No acute osseus abnormalities.  IMPRESSION: No acute cardiopulmonary disease. Specifically, no evidence of pneumonia.   Electronically Signed   By: Simonne Come M.D.   On: 01/30/2015 07:28     EKG Interpretation   Date/Time:  Wednesday January 30 2015 06:59:11 EDT Ventricular Rate:  100 PR Interval:  148 QRS Duration: 86 QT Interval:  357 QTC Calculation: 460 R Axis:   98 Text Interpretation:  Sinus tachycardia Borderline right axis deviation  Confirmed by Lincoln Brigham [id number]) on 01/30/2015 9:34:34 AM      MDM   Final diagnoses:  Bronchitis  Viral syndrome    Medications  albuterol (PROVENTIL HFA;VENTOLIN HFA) 108 (90 BASE) MCG/ACT inhaler 2 puff (not administered)  sodium chloride 0.9 % bolus 1,000 mL (0 mLs Intravenous Stopped 01/30/15 0915)    Filed Vitals:   01/30/15 6045 01/30/15 0749 01/30/15 0806 01/30/15 0845  BP: 133/66  128/63 121/60  Pulse:  97 101 72  Temp:      TempSrc:      Resp:   15 16  Height:      Weight:      SpO2:  98% 94% 96%   EKG noted sinus tachycardia with a heart rate 100 bpm with borderline right axis deviation. Troponin negative elevation. D-dimer negative elevation. CBC noted mildly elevated leukocytosis of 11.8. Hemoglobin 10.5, hematocrit  32.5 - when compared to previous labs, patient has had Hgb at this level. BMP noted  mildly elevated glucose of 113, negative elevated anion gap. Rapid strep test negative. Urinalysis noted moderate hemoglobin with negative nitrites, leukocytes-many squamous cells identified with few bacteria. On microscopic add on, red blood cell count is 0-2, not significant. Urine pregnancy negative. Chest x-ray no acute cardiopulmonary disease. Specifically no evidence of pneumonia. Doubt PE. Doubt ACS. Negative findings of pneumonia. Negative findings of UTI or pyelonephritis. Suspicion to be possible bronchitis, viral in nature. Patient stable, afebrile. Patient not septic appearing. Discharged patient. Discharge patient with albuterol inhaler and Z-Pak. Discussed patient to rest and stay hydrated. Referred [facility] to [facility]. Discussed with patient to closely monitor symptoms and if symptoms are to worsen or change to report back to the ED - strict return instructions given.  Patient agreed to plan of care, understood, all questions answered.   Raymon Mutton, PA-C 01/30/15 1001  Tilden Fossa, MD 01/30/15 1024

## 2015-01-30 NOTE — ED Notes (Signed)
PA made aware pt is ready to go

## 2015-01-30 NOTE — Discharge Instructions (Signed)
Please call your doctor for a followup appointment within 24-48 hours. When you talk to your doctor please let them know that you were seen in the emergency department and have them acquire all of your records so that they can discuss the findings with you and formulate a treatment plan to fully care for your new and ongoing problems. Please call and set-up an appointment with [facility]  Please rest and stay hydrated Please use albuterol inhaler every 6-8 hours as needed for shortness of breath Please take antibiotics as prescribed and on a full stomach  Please continue to monitor symptoms closely and if symptoms are to worsen or change (fever greater than 101, chills, sweating, nausea, vomiting, chest pain, shortness of breathe, difficulty breathing, weakness, numbness, tingling, worsening or changes to pain pattern, headache, dizziness, fainting, neck pain, neck stiffness, visual changes, inability to keep food or fluid down, stomach pain, coughing up blood) please report back to the Emergency Department immediately.   Acute Bronchitis Bronchitis is inflammation of the airways that extend from the windpipe into the lungs (bronchi). The inflammation often causes mucus to develop. This leads to a cough, which is the most common symptom of bronchitis.  In acute bronchitis, the condition usually develops suddenly and goes away over time, usually in a couple weeks. Smoking, allergies, and asthma can make bronchitis worse. Repeated episodes of bronchitis may cause further lung problems.  CAUSES Acute bronchitis is most often caused by the same virus that causes a cold. The virus can spread from person to person (contagious) through coughing, sneezing, and touching contaminated objects. SIGNS AND SYMPTOMS   Cough.   Fever.   Coughing up mucus.   Body aches.   Chest congestion.   Chills.   Shortness of breath.   Sore throat.  DIAGNOSIS  Acute bronchitis is usually  diagnosed through a physical exam. Your health care provider will also ask you questions about your medical history. Tests, such as chest X-rays, are sometimes done to rule out other conditions.  TREATMENT  Acute bronchitis usually goes away in a couple weeks. Oftentimes, no medical treatment is necessary. Medicines are sometimes given for relief of fever or cough. Antibiotic medicines are usually not needed but may be prescribed in certain situations. In some cases, an inhaler may be recommended to help reduce shortness of breath and control the cough. A cool mist vaporizer may also be used to help thin bronchial secretions and make it easier to clear the chest.  HOME CARE INSTRUCTIONS  Get plenty of rest.   Drink enough fluids to keep your urine clear or pale yellow (unless you have a medical condition that requires fluid restriction). Increasing fluids may help thin your respiratory secretions (sputum) and reduce chest congestion, and it will prevent dehydration.   Take medicines only as directed by your health care provider.  If you were prescribed an antibiotic medicine, finish it all even if you start to feel better.  Avoid smoking and secondhand smoke. Exposure to cigarette smoke or irritating chemicals will make bronchitis worse. If you are a smoker, consider using nicotine gum or skin patches to help control withdrawal symptoms. Quitting smoking will help your lungs heal faster.   Reduce the chances of another bout of acute bronchitis by washing your hands frequently, avoiding people with cold symptoms, and trying not to touch your hands to your mouth, nose, or eyes.   Keep all follow-up visits as directed by your health care provider.  SEEK MEDICAL  CARE IF: Your symptoms do not improve after 1 week of treatment.  SEEK IMMEDIATE MEDICAL CARE IF:  You develop an increased fever or chills.   You have chest pain.   You have severe shortness of breath.  You have bloody sputum.    You develop dehydration.  You faint or repeatedly feel like you are going to pass out.  You develop repeated vomiting.  You develop a severe headache. MAKE SURE YOU:   Understand these instructions.  Will watch your condition.  Will get help right away if you are not doing well or get worse. Document Released: 12/10/2004 Document Revised: 03/19/2014 Document Reviewed: 04/25/2013 [facility] Patient Information 2015 [employer] [place]. This information is not intended to replace advice given to you by your health care provider. Make sure you discuss any questions you have with your health care provider.   Emergency Department Resource Guide 1) Find a Doctor and Pay Out of Pocket Although you won't have to find out who is covered by your insurance plan, it is a good idea to ask around and get recommendations. You will then need to call the office and see if the doctor you have chosen will accept you as a new patient and what types of options they offer for patients who are self-pay. Some doctors offer discounts or will set up payment plans for their patients who do not have insurance, but you will need to ask so you aren't surprised when you get to your appointment.  2) Contact Your Local Health Department Not all health departments have doctors that can see patients for sick visits, but many do, so it is worth a call to see if yours does. If you don't know where your local health department is, you can check in your phone book. The CDC also has a tool to help you locate your state's health department, and many state websites also have listings of all of their local health departments.  3) Find a Walk-in Clinic If your illness is not likely to be very severe or complicated, you may want to try a walk in clinic. These are popping up all over the country in pharmacies, drugstores, and shopping centers. They're usually staffed by nurse practitioners or physician assistants that have been trained  to treat common illnesses and complaints. They're usually fairly quick and inexpensive. However, if you have serious medical issues or chronic medical problems, these are probably not your best option.  No Primary Care Doctor: - Call Health Connect at  [phone] - they can help you locate a primary care doctor that  accepts your insurance, provides certain services, etc. - Physician Referral Service- [phone]  Chronic Pain Problems: Organization         Address  Phone   Notes  [facility]  [phone] Patients need to be referred by their primary care doctor.   Medication Assistance: Organization         Address  Phone   Notes  [facility] [address] [place], [place] [phone] --Must be a resident of [place] -- Must have NO insurance coverage whatsoever (no Medicaid/ Medicare, etc.) -- The pt. MUST have a primary care doctor that directs their care regularly and follows them in the community   [facility]  [phone]   [employer]  [phone]    Agencies that provide inexpensive medical care: Organization         Address  Phone   Notes  [facility]  [phone]   [facility]    [phone]   [facility] [address] [place], [place] [address] [phone]   [facility] [place] [facility] [address] [place], [place] [phone]   Planned Parenthood    [phone]   [facility]    [phone]   [facility]  [address], [place] Phone:  [phone], Fax:  [phone] Hours of Operation:  9 am - 6 pm, M-F.  Also accepts Medicaid/Medicare and self-pay.  [facility] for Children  [address], [place] Phone: [phone], Fax: [phone]. Hours of Operation:  8:30 am - 5:30 pm, M-F.  Also accepts Medicaid and self-pay.   [facility] [place] [address], [place] Phone: [phone]   [facility] [address] Natasha [place]ence [place], [place] [phone], Ext. [phone] Mondays & Thursdays: 7-9 AM.  First 15 patients are seen on a first come, first serve basis.    Medicaid-accepting [facility] Providers:  Organization         Address  Phone   Notes  [facility] [address], [place] [phone] Also accepts self-pay patients.  [facility] [address] Laurell Josephs [place], [place]  [phone]   [facility] [address], [place] [phone]   [facility] [address], [place] [phone]   Renaye Rakers [address], [place]   [phone] Only accepts [place] Access [place] patients after they have their name applied to their card.   Self-Pay (no insurance) in [facility]:  Organization         Address  Phone   Notes  Sickle Cell Patients, [facility] [address] [place], [place] [phone]   [facility] [address] [place], [place] [phone]   [facility] [place]  [address], [place] [phone]   [facility]/Dr. Osei-Bonsu  [address] [place] or [address], [place] [phone] Phone number for both [place] and [place] locations is the same.  [facility] [address] [place] [phone]   [facility] [address] [place] or [address] [phone]   [facility] [address] [place] [phone], phone; [phone], fax Sees patients 1st and 3rd Saturday of every month.  Must not qualify for public or private insurance (i.e. Medicaid, Medicare, [place] Health Choice, Veterans' Benefits)  Household income should be no  more than 200% of the poverty level The clinic cannot treat you if you are pregnant or think you are pregnant  Sexually transmitted diseases are not treated at the clinic.    Dental Care: Organization         Address  Phone  Notes  [facility] Department of [facility] [address] [place], [place] [phone] Accepts children up to age 4 who are enrolled in [place] or [place] Health Choice; pregnant women with a Medicaid card; and children who have applied for Medicaid or [place] Health Choice, but were declined, whose parents can pay a reduced fee at time of service.  [facility] Department of [facility]  [address], [place] [phone] Accepts children up to age 73 who are enrolled in [place] or [place] Health Choice; pregnant women with a Medicaid card; and children who have applied for Medicaid or [place] Health Choice, but were declined, whose parents can pay a reduced fee at time of service.  [facility]  [address] [place], [place] [phone] Patients are seen by appointment only. Walk-ins are not accepted. [facility] will see patients 10 years of age and older. Monday - Tuesday (8am-5pm) Most Wednesdays (8:30-5pm) $30 per visit, cash only  [facility] Adult Dental Access PROGRAM  [address], [facility] [phone] Patients are seen by appointment only. Walk-ins are not accepted. [facility] will see patients 60 years of age and older. One Wednesday Evening (Monthly: Volunteer Based).  $30 per visit, cash only  [employer]  [phone] for adults; Children under age 68, call Graduate Pediatric Dentistry at [phone]. Children aged 16-14, please call [phone] to request a pediatric application.  Dental services are provided in all areas of dental care including fillings, crowns and bridges, complete and partial dentures, implants, gum treatment, root canals,  and extractions. Preventive care is also provided. Treatment is provided to both adults and children. Patients are selected via a lottery and there is often a waiting list.   [facility] [address] [place]  [phone] [email or web address]   [facility] [address] [place], [place] [phone], Ext. [phone] Second and Fourth Thursday of each month, opens at 6:30 AM; Clinic ends at 9 AM.  Patients are seen on a first-come first-served basis, and a limited number are seen during each clinic.   [facility]  [address] Ether Griffins [place], [place] [phone]   Eligibility Requirements You must have lived in [place], [place], or [place] counties for at least the last three months.   You cannot be eligible for state or federal sponsored [place], including [employer], [place], or [employer].   You generally cannot be eligible for healthcare insurance through your employer.    How to apply: Eligibility screenings are held every Tuesday and Wednesday afternoon from 1:00 pm until 4:00 pm. You do not need an appointment for the interview!  [facility] [address] [place], [place] [phone]   [facility] Health Department  [phone]   [facility] Health Department  [phone]   [facility] Health Department  [phone]    Behavioral Health Resources in the Community: Intensive Outpatient Programs Organization         Address  Phone  Notes  [facility] [address], [place], [place] [phone]   [facility] Outpatient [address] [place], [place] [phone]   [facility] [address] [place], [place]  [phone]   [facility] Mental Health [address]  [place], [place] [phone] or [phone]   Substance Abuse Resources Organization         Address  Phone  Notes  [facility]  [phone]    [facility]  [phone]   [facility]  [phone]   Floydene Flock  [phone]   Residential & Outpatient Substance Abuse Program  [phone]   Psychological Services Organization         Address  Phone  Notes  [facility] Behavioral Health  [phone]   [facility]  [phone]   [facility] Mental Health [address], [place] [phone] or [phone]    Mobile Crisis Teams Organization         Address  Phone  Notes  [facility], Mobile Crisis Care Unit  [phone]   [facility]  [address] [place], [place] [phone]   Doristine Locks [address] [place] [phone]    Self-Help/Support Groups Organization         Address  Phone             Notes  [facility] [place] - variety of support groups  [phone]- [phone] Call for more information  Narcotics Anonymous (NA), [facility] [address], [employer] [place]  2 meetings at this location   Statistician         Address  Phone  Notes  [facility] [address],    [place]  [phone]   [facility]  [address] [place] [address], [place], [place] [phone]   [facility] Treatment Facility [address] [place], [place] [phone] Admissions: 8am-3pm M-F  [facility] [address]    [place], [place] [phone]   [facility] [address] [place], [place], [place] [phone]   [facility] [address],  [place], [place] [phone]   [facility] - Intensive Outpatient [address] Laurell Joseph[address], [place], [place] [phone]   [facility] ([facility] [address] [place]  [place], [place] [phone] or [phone]   [facility] ([facility]) [address] [place], [place] [phone] Accepts Medicaid  [facility] [address],   [place] [phone] Substance Abuse/Addiction Treatment   [facility] Organization         Address  Phone  Notes  [facility]  [phone]   Angie Fava, PhD [address] Ervin Knack [place], [place]   [phone] or [phone]   [facility] Behavioral   [address] [place], [place] [phone]   [facility] [address], [place], [place] [phone] Insurance/Medicaid/sponsorship through [facility] and Families [address]                                    [place], [place] [phone] Therapy/tele-psych/case  [facility] [address], [place] [phone]    Dr. Lolly Mustache  [phone]   [facility]  [employer] [facility] Dept. 1) [address], [place] 2) [address], [place] 3)  [address], [place] [phone]  [phone]   [facility] Child Abuse Hotline [phone])799-6578 or [phone] (After Hours)

## 2015-02-01 LAB — CULTURE, GROUP A STREP: STREP A CULTURE: NEGATIVE

## 2015-11-01 ENCOUNTER — Emergency Department (HOSPITAL_COMMUNITY)
Admission: EM | Admit: 2015-11-01 | Discharge: 2015-11-01 | Disposition: A | Discharge status: Home / Self Care | Payer: Self-pay | Attending: Emergency Medicine | Admitting: Emergency Medicine

## 2015-11-01 ENCOUNTER — Encounter (HOSPITAL_COMMUNITY): Payer: Self-pay | Admitting: Emergency Medicine

## 2015-11-01 DIAGNOSIS — Y990 Civilian activity done for income or pay: Secondary | ICD-10-CM | POA: Insufficient documentation

## 2015-11-01 DIAGNOSIS — Y9289 Other specified places as the place of occurrence of the external cause: Secondary | ICD-10-CM | POA: Insufficient documentation

## 2015-11-01 DIAGNOSIS — S39012A Strain of muscle, fascia and tendon of lower back, initial encounter: Secondary | ICD-10-CM | POA: Insufficient documentation

## 2015-11-01 DIAGNOSIS — Z8669 Personal history of other diseases of the nervous system and sense organs: Secondary | ICD-10-CM | POA: Insufficient documentation

## 2015-11-01 DIAGNOSIS — I10 Essential (primary) hypertension: Secondary | ICD-10-CM | POA: Insufficient documentation

## 2015-11-01 DIAGNOSIS — G8929 Other chronic pain: Secondary | ICD-10-CM | POA: Insufficient documentation

## 2015-11-01 DIAGNOSIS — X500XXA Overexertion from strenuous movement or load, initial encounter: Secondary | ICD-10-CM | POA: Insufficient documentation

## 2015-11-01 DIAGNOSIS — Y9389 Activity, other specified: Secondary | ICD-10-CM | POA: Insufficient documentation

## 2015-11-01 DIAGNOSIS — M25551 Pain in right hip: Secondary | ICD-10-CM | POA: Insufficient documentation

## 2015-11-01 DIAGNOSIS — Z79899 Other long term (current) drug therapy: Secondary | ICD-10-CM | POA: Insufficient documentation

## 2015-11-01 MED ORDER — NAPROXEN 500 MG PO TABS
500.0000 mg | ORAL_TABLET | Freq: Once | ORAL | Status: AC
  Administered 2015-11-01: 500 mg via ORAL
  Filled 2015-11-01: qty 1

## 2015-11-01 MED ORDER — METHOCARBAMOL 500 MG PO TABS: 500.0000 mg | ORAL_TABLET | Freq: Two times a day (BID) | ORAL | Status: DC

## 2015-11-01 MED ORDER — NAPROXEN 500 MG PO TABS: 500.0000 mg | ORAL_TABLET | Freq: Two times a day (BID) | ORAL | Status: DC

## 2015-11-01 NOTE — ED Notes (Signed)
Per pt, states lower back pain and right hip pain on and off for months-thinks she pulled back at work

## 2015-11-01 NOTE — Discharge Instructions (Signed)
Take Naproxen and robaxin as prescribed. Alternate ice and heat to your back. Follow up with a primary care doctor and an orthopedist. Return as needed if symptoms worsen.  Lumbosacral Strain Lumbosacral strain is a strain of any of the parts that make up your lumbosacral vertebrae. Your lumbosacral vertebrae are the bones that make up the lower third of your backbone. Your lumbosacral vertebrae are held together by muscles and tough, fibrous tissue (ligaments).  CAUSES  A sudden blow to your back can cause lumbosacral strain. Also, anything that causes an excessive stretch of the muscles in the low back can cause this strain. This is typically seen when people exert themselves strenuously, fall, lift heavy objects, bend, or crouch repeatedly. RISK FACTORS  Physically demanding work.  Participation in pushing or pulling sports or sports that require a sudden twist of the back (tennis, golf, baseball).  Weight lifting.  Excessive lower back curvature.  Forward-tilted pelvis.  Weak back or abdominal muscles or both.  Tight hamstrings. SIGNS AND SYMPTOMS  Lumbosacral strain may cause pain in the area of your injury or pain that moves (radiates) down your leg.  DIAGNOSIS Your health care provider can often diagnose lumbosacral strain through a physical exam. In some cases, you may need tests such as X-ray exams.  TREATMENT  Treatment for your lower back injury depends on many factors that your clinician will have to evaluate. However, most treatment will include the use of anti-inflammatory medicines. HOME CARE INSTRUCTIONS   Avoid hard physical activities (tennis, racquetball, waterskiing) if you are not in proper physical condition for it. This may aggravate or create problems.  If you have a back problem, avoid sports requiring sudden body movements. Swimming and walking are generally safer activities.  Maintain good posture.  Maintain a healthy weight.  For acute conditions, you  may put ice on the injured area.  Put ice in a plastic bag.  Place a towel between your skin and the bag.  Leave the ice on for 20 minutes, 2-3 times a day.  When the low back starts healing, stretching and strengthening exercises may be recommended. SEEK MEDICAL CARE IF:  Your back pain is getting worse.  You experience severe back pain not relieved with medicines. SEEK IMMEDIATE MEDICAL CARE IF:   You have numbness, tingling, weakness, or problems with the use of your arms or legs.  There is a change in bowel or bladder control.  You have increasing pain in any area of the body, including your belly (abdomen).  You notice shortness of breath, dizziness, or feel faint.  You feel sick to your stomach (nauseous), are throwing up (vomiting), or become sweaty.  You notice discoloration of your toes or legs, or your feet get very cold. MAKE SURE YOU:   Understand these instructions.  Will watch your condition.  Will get help right away if you are not doing well or get worse.   This information is not intended to replace advice given to you by your health care provider. Make sure you discuss any questions you have with your health care provider.   Document Released: 08/12/2005 Document Revised: 11/23/2014 Document Reviewed: 06/21/2013 Elsevier Interactive Patient Education [employer].
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
## 2015-12-07 ENCOUNTER — Encounter (HOSPITAL_BASED_OUTPATIENT_CLINIC_OR_DEPARTMENT_OTHER): Payer: Self-pay | Admitting: *Deleted

## 2015-12-07 ENCOUNTER — Emergency Department (HOSPITAL_BASED_OUTPATIENT_CLINIC_OR_DEPARTMENT_OTHER)
Admission: EM | Admit: 2015-12-07 | Discharge: 2015-12-07 | Disposition: A | Discharge status: Home / Self Care | Payer: Self-pay | Attending: Emergency Medicine | Admitting: Emergency Medicine

## 2015-12-07 ENCOUNTER — Emergency Department (HOSPITAL_BASED_OUTPATIENT_CLINIC_OR_DEPARTMENT_OTHER): Payer: Self-pay

## 2015-12-07 DIAGNOSIS — R058 Other specified cough: Secondary | ICD-10-CM

## 2015-12-07 DIAGNOSIS — I10 Essential (primary) hypertension: Secondary | ICD-10-CM | POA: Insufficient documentation

## 2015-12-07 DIAGNOSIS — Z8669 Personal history of other diseases of the nervous system and sense organs: Secondary | ICD-10-CM | POA: Insufficient documentation

## 2015-12-07 DIAGNOSIS — Z791 Long term (current) use of non-steroidal anti-inflammatories (NSAID): Secondary | ICD-10-CM | POA: Insufficient documentation

## 2015-12-07 DIAGNOSIS — J9801 Acute bronchospasm: Secondary | ICD-10-CM | POA: Insufficient documentation

## 2015-12-07 DIAGNOSIS — Z8639 Personal history of other endocrine, nutritional and metabolic disease: Secondary | ICD-10-CM | POA: Insufficient documentation

## 2015-12-07 DIAGNOSIS — R000 Tachycardia, unspecified: Secondary | ICD-10-CM | POA: Insufficient documentation

## 2015-12-07 DIAGNOSIS — R05 Cough: Secondary | ICD-10-CM

## 2015-12-07 DIAGNOSIS — G939 Disorder of brain, unspecified: Secondary | ICD-10-CM | POA: Insufficient documentation

## 2015-12-07 DIAGNOSIS — Z79899 Other long term (current) drug therapy: Secondary | ICD-10-CM | POA: Insufficient documentation

## 2015-12-07 MED ORDER — TRIAMTERENE-HCTZ 37.5-25 MG PO CAPS: 1.0000 | ORAL_CAPSULE | ORAL | Status: DC

## 2015-12-07 MED ORDER — PREDNISONE 20 MG PO TABS: 40.0000 mg | ORAL_TABLET | Freq: Every day | ORAL | Status: DC

## 2015-12-07 MED ORDER — BENZONATATE 100 MG PO CAPS: 200.0000 mg | ORAL_CAPSULE | Freq: Two times a day (BID) | ORAL | Status: DC | PRN

## 2015-12-07 MED ORDER — PREDNISONE 50 MG PO TABS
60.0000 mg | ORAL_TABLET | Freq: Once | ORAL | Status: AC
  Administered 2015-12-07: 60 mg via ORAL
  Filled 2015-12-07: qty 1

## 2015-12-07 MED ORDER — AEROCHAMBER PLUS FLO-VU MEDIUM MISC
1.0000 | Freq: Once | Status: AC
  Administered 2015-12-07: 1
  Filled 2015-12-07: qty 1

## 2015-12-07 MED ORDER — ALBUTEROL SULFATE HFA 108 (90 BASE) MCG/ACT IN AERS
2.0000 | INHALATION_SPRAY | RESPIRATORY_TRACT | Status: DC | PRN
  Administered 2015-12-07: 2 via RESPIRATORY_TRACT
  Filled 2015-12-07: qty 6.7

## 2015-12-07 NOTE — ED Provider Notes (Signed)
CSN: [id number]     Arrival date & time 12/07/15  1220 History   First MD Initiated Contact with Patient 12/07/15 1253     Chief Complaint  Patient presents with  . Cough     (Consider location/radiation/quality/duration/timing/severity/associated sxs/prior Treatment) The history is provided by the patient and medical records. No language interpreter was used.     Grace Daniels is a 25 y.o. female  with a hx of HTN, HLD, Sleep apnea presents to the Emergency Department complaining of gradual, persistent, cough with associated chest tightness onset approx 1 month ago.  Pt reports that when the symptoms began she had full URI symptoms including rhinorrhea, otalgia, ST, nasal congestion.  She reports these symptoms have improved but the cough has persisted.  She has taken benadryl and claritin without relief.  Pt denies smoking.  She reports anterior CP and rib pain with cough.  Pt reports some burning with deep breathing. Nothing makes the symptoms better or worse.  Pt denies fever, chills, headache, neck pain, chest pain, SOB, abd pain, N/V/D, weakness, dizziness, syncope.      Past Medical History  Diagnosis Date  . Hypertension   . Hyperlipidemia   . Sleep apnea    No past surgical history on file. No family history on file. Social History  Substance Use Topics  . Smoking status: Never Smoker   . Smokeless tobacco: Never Used  . Alcohol Use: No   OB History    Gravida Para Term Preterm AB TAB SAB Ectopic Multiple Living   0         0     Review of Systems  Constitutional: Negative for fever, chills, appetite change and fatigue.  HENT: Positive for rhinorrhea ( mild). Negative for congestion, ear discharge, ear pain, mouth sores, postnasal drip, sinus pressure and sore throat.   Eyes: Negative for visual disturbance.  Respiratory: Positive for cough, chest tightness and wheezing. Negative for shortness of breath and stridor.   Cardiovascular: Negative for chest pain,  palpitations and leg swelling.  Gastrointestinal: Negative for nausea, vomiting, abdominal pain and diarrhea.  Genitourinary: Negative for dysuria, urgency, frequency and hematuria.  Musculoskeletal: Negative for myalgias, back pain, arthralgias and neck stiffness.  Skin: Negative for rash.  Neurological: Negative for syncope, light-headedness, numbness and headaches.  Hematological: Negative for adenopathy.  Psychiatric/Behavioral: The patient is not nervous/anxious.   All other systems reviewed and are negative.     Allergies  Review of patient's allergies indicates no known allergies.  Home Medications   Prior to Admission medications   Medication Sig Start Date End Date Taking? Authorizing Provider  benzonatate (TESSALON) 100 MG capsule Take 2 capsules (200 mg total) by mouth 2 (two) times daily as needed for cough. 12/07/15   Angeletta Goelz, PA-C  guaiFENesin (ROBITUSSIN) 100 MG/5ML SOLN Take 5 mLs by mouth every 4 (four) hours as needed for cough or to loosen phlegm.    Historical Provider, MD  methocarbamol (ROBAXIN) 500 MG tablet Take 1 tablet (500 mg total) by mouth 2 (two) times daily. 11/01/15   Antony Madura, PA-C  naproxen (NAPROSYN) 500 MG tablet Take 1 tablet (500 mg total) by mouth 2 (two) times daily. 11/01/15   Antony Madura, PA-C  predniSONE (DELTASONE) 20 MG tablet Take 2 tablets (40 mg total) by mouth daily. 12/07/15   Dimples Probus, PA-C  Sennosides (LAXATIVE) 25 MG TABS Take 1 tablet by mouth daily as needed (constipation).    Historical Provider, MD  triamterene-hydrochlorothiazide Ilsa Iha)  37.5-25 MG capsule Take 1 each (1 capsule total) by mouth every morning. 12/07/15   Dahlia Client Dori Devino, PA-C   BP 143/88 mmHg  Pulse 80  Temp(Src) 98.7 F (37.1 C) (Oral)  Resp 20  Ht  (1.778 m)  Wt 172.095 kg  BMI 54.44 kg/m2  SpO2 97% Physical Exam  Constitutional: She is oriented to person, place, and time. She appears well-developed and well-nourished.  No distress.  HENT:  Head: Normocephalic and atraumatic.  Right Ear: Tympanic membrane, external ear and ear canal normal.  Left Ear: Tympanic membrane, external ear and ear canal normal.  Nose: Mucosal edema and rhinorrhea present. No epistaxis. Right sinus exhibits no maxillary sinus tenderness and no frontal sinus tenderness. Left sinus exhibits no maxillary sinus tenderness and no frontal sinus tenderness.  Mouth/Throat: Uvula is midline and mucous membranes are normal. Mucous membranes are not pale and not cyanotic. No oropharyngeal exudate, posterior oropharyngeal edema, posterior oropharyngeal erythema or tonsillar abscesses.  Eyes: Conjunctivae are normal. Pupils are equal, round, and reactive to light.  Neck: Normal range of motion and full passive range of motion without pain.  Cardiovascular: Normal heart sounds and intact distal pulses.  Tachycardia present.   Mild ~100bpm  Pulmonary/Chest: Effort normal and breath sounds normal. No stridor.  Diminished breath sounds in the bases. No focal wheezes, rhonchi, rales  Abdominal: Soft. Bowel sounds are normal. There is no tenderness.  Musculoskeletal: Normal range of motion.  Lymphadenopathy:    She has no cervical adenopathy.  Neurological: She is alert and oriented to person, place, and time.  Skin: Skin is warm and dry. No rash noted. She is not diaphoretic.  Psychiatric: She has a normal mood and affect.  Nursing note and vitals reviewed.   ED Course  Procedures (including critical care time)  MDM   Final diagnoses:  Cough due to bronchospasm  Post-viral cough syndrome   [name]  Presents with persistent cough after URI. Patient is afebrile. Highly doubt pneumonia. Patient was offered chest x-ray and declines.   No audible wheezing however patient is diminished. Suspect  Mild bronchospasm. Patient will be treated with   Albuterol, prednisone and Tessalon.   in addition patient reports she does not have a primary  care and wishes for a refill of her hypertension medications. She takes Dyazide each day but reports she has been out of this for several months. Patient is hypertensive today however denies chest pain. No evidence of hypertensive urgency. Patient given resource guide to find primary care physician.   BP 143/88 mmHg  Pulse 80  Temp(Src) 98.7 F (37.1 C) (Oral)  Resp 20  Ht  (1.778 m)  Wt 172.095 kg  BMI 54.44 kg/m2  SpO2 97%   Dierdre Forth, PA-C 12/07/15 1449  Alvira Monday, MD 12/08/15 [id number]

## 2015-12-07 NOTE — Discharge Instructions (Signed)
1. Medications: albuterol, prednisone, tessalon, refill of your blood pressure medicines, usual home medications 2. Treatment: rest, drink plenty of fluids, continue OTC antihistamine (Zyrtec or Claritin)  3. Follow Up: Please followup with your primary doctor in 2-3 days for discussion of your diagnoses and further evaluation after today's visit; if you do not have a primary care doctor use the resource guide provided to find one; Please return to the ER for difficulty breathing, high fevers or worsening symptoms.    Emergency Department Resource Guide 1) Find a Doctor and Pay Out of Pocket Although you won't have to find out who is covered by your insurance plan, it is a good idea to ask around and get recommendations. You will then need to call the office and see if the doctor you have chosen will accept you as a new patient and what types of options they offer for patients who are self-pay. Some doctors offer discounts or will set up payment plans for their patients who do not have insurance, but you will need to ask so you aren't surprised when you get to your appointment.  2) Contact Your Local Health Department Not all health departments have doctors that can see patients for sick visits, but many do, so it is worth a call to see if yours does. If you don't know where your local health department is, you can check in your phone book. The CDC also has a tool to help you locate your state's health department, and many state websites also have listings of all of their local health departments.  3) Find a Walk-in Clinic If your illness is not likely to be very severe or complicated, you may want to try a walk in clinic. These are popping up all over the country in pharmacies, drugstores, and shopping centers. They're usually staffed by nurse practitioners or physician assistants that have been trained to treat common illnesses and complaints. They're usually fairly quick and inexpensive. However, if  you have serious medical issues or chronic medical problems, these are probably not your best option.  No Primary Care Doctor: - Call Health Connect at  [phone] - they can help you locate a primary care doctor that  accepts your insurance, provides certain services, etc. - Physician Referral Service- [phone]  Chronic Pain Problems: Organization         Address  Phone   Notes  [facility]  [phone] Patients need to be referred by their primary care doctor.   Medication Assistance: Organization         Address  Phone   Notes  [facility] [address] [place] [place], [place] [address] [phone] --Must be a resident of [facility] -- Must have NO insurance coverage whatsoever (no Medicaid/ Medicare, etc.) -- The pt. MUST have a primary care doctor that directs their care regularly and follows them in the community   [facility]  [phone]   [employer]  [phone]    Agencies that provide inexpensive medical care: Organization         Address  Phone   Notes  [facility]  [phone]   [facility]    [phone]   [facility] [address] [place], [place] [address] [phone]   [facility] [address] [place] [place] [phone]   Planned Parenthood    [phone]   [facility]    [phone]   [facility]  [address], [place] Phone:  [phone], Fax:  [phone] Hours of Operation:  9 am - 6 pm, M-F.  Also accepts Medicaid/Medicare and self-pay.  [facility]  [address], [place] Phone: [phone], Fax: [phone]. Hours of Operation:  8:30 am - 5:30 pm, M-F.  Also accepts Medicaid and self-pay.  [facility] [place] [address], [place] Phone: [phone]   [facility] [address] Natasha Bence [place], [place] [phone], Ext. [phone] Mondays & Thursdays: 7-9 AM.  First 15 patients are seen on a first come, first serve basis.    Medicaid-accepting [facility] Providers:  Organization         Address  Phone   Notes  [facility] [address], [place] [phone] Also accepts self-pay patients.  [facility] [address] Laurell Josephs [place], [place]  [phone]   [facility] [address], [place] [phone]   [facility] [address], [place] [phone]   Renaye Rakers [address], [place]   [phone] Only accepts [place] Access [place] patients after they have their name applied to their card.   Self-Pay (no insurance) in [facility]:  Organization         Address  Phone   Notes  Sickle Cell Patients, [facility] [address] [place], [place] [phone]   [facility] [address] [place], [place] [phone]   [facility]  [address], [place] [phone]   [facility]/Dr. Osei-Bonsu  [address], [place] or [address], [place] [phone] Phone number for both [place] and [place] locations is the same.  [facility] [address], [place] [phone]   [facility] [address], [place] or [address] [phone]   [facility] [address], [place] [phone], phone; [phone], fax Sees patients 1st and 3rd Saturday of every month.  Must not qualify for public or private insurance (i.e. Medicaid, Medicare, [place] Health Choice, Veterans' Benefits)  Household income should be no more than 200% of the poverty level The clinic cannot treat you if you are pregnant or think you are  pregnant  Sexually transmitted diseases are not treated at the clinic.    Dental Care: Organization         Address  Phone  Notes  [facility] Department of [facility] [address] [place], [place] [phone] Accepts children up to age 70 who are enrolled in [place] or [place] Health Choice; pregnant women with a Medicaid card; and children who have applied for Medicaid or [place] Health Choice, but were declined, whose parents can pay a reduced fee at time of service.  [facility] Department of [facility]  [address], [place] [phone] Accepts children up to age 75 who are enrolled in [place] or [place] Health Choice; pregnant women with a Medicaid card; and children who have applied for Medicaid or [place] Health Choice, but were declined, whose parents can pay a reduced fee at time of service.  [facility]  [address] [place], [place] [phone] Patients are seen by appointment only. Walk-ins are not accepted. [facility] will see patients 5 years of age and older. Monday - Tuesday (8am-5pm) Most Wednesdays (8:30-5pm) $30 per visit, cash only  [facility]  [address], [facility] [phone] Patients are seen by appointment only. Walk-ins are not accepted. [facility] will see patients 36 years of age and older. One Wednesday Evening (Monthly: Volunteer Based).  $30 per visit, cash only  [employer]  [phone] for adults; Children under age 66, call Graduate Pediatric Dentistry at [phone]. Children aged 32-14, please call [phone] to request a pediatric application.  Dental services are provided in all areas of dental care including fillings, crowns and bridges, complete and partial dentures, implants, gum treatment, root canals, and extractions. Preventive care is also provided. Treatment is provided to both adults and  children. Patients are selected via a lottery and there is often a waiting list.   [facility] [address] [place]  [phone] [email or web address]   [facility] [address] [place], [place] [phone], Ext. [phone] Second and Fourth Thursday of each month, opens at 6:30 AM; Clinic ends at 9 AM.  Patients are seen on a first-come first-served basis, and a limited number are seen during each clinic.   [facility]  [address] Ether Griffins [place], [place] [phone]   Eligibility Requirements You must have lived in [place], [place], or [place] counties for at least the last three months.   You cannot be eligible for state or federal sponsored [place], including [employer], [place], or [employer].   You generally cannot be eligible for healthcare insurance through your employer.    How to apply: Eligibility screenings are held every Tuesday and Wednesday afternoon from 1:00 pm until 4:00 pm. You do not need an appointment for the interview!  [facility] [address], [place], [place] [phone]   [facility] Health Department  [phone]   [facility] Health Department  [phone]   [facility] Health Department  [phone]    Behavioral Health Resources in the Community: Intensive Outpatient Programs Organization         Address  Phone  Notes  [facility] [address], [place], [place] [phone]   [facility] Outpatient [address] [place], [place] [phone]   [facility] [address] [place], [place]  [phone]   [facility] [address]  [place], [place] [phone] or [phone]   Substance Abuse Resources Organization         Address  Phone  Notes  [facility]  [phone]   [facility]  [phone]   [facility] [place]  [phone]    Floydene Flock  [phone]   Residential & Outpatient Substance Abuse Program  [phone]   Psychological Services Organization         Address  Phone  Notes  [facility] Behavior[facility] Health  [phone]   [facility]  [phone]   [facility] Mental Health [address] [place] [phone] or [phone]    Mobile Crisis Teams Organization         Address  Phone  Notes  [facility], Mobile Crisis Care Unit  [phone]   [facility]  [address] [place]in[place], [place] [phone]   [facility] [address] [place] [phone]    Self-Help/Support Groups Organization         Address  Phone             Notes  [facility] [place] - variety of support groups  [phone]- [phone] Call for more information  Narcotics Anonymous (NA), [facility] [address], [employer] [place]  2 meetings at this location   Statistician         Address  Phone  Notes  [facility] [address],    [place]  [phone]   [facility]  [address], [place] [address], [place], [place] [phone]   [facility] [address] [place], [place] [phone] Admissions: 8am-3pm M-F  [facility] [address]    [place], [place] [phone]   [facility] [address] [place], [place], [place] [phone]   [facility] [address],  [place], [place] [phone]   [facility] - Intensive Outpatient [address]urell [address], [place], [place] [phone]   [facility] ([facility] [address] [place].,  [place], [place] [phone] or [phone]   [facility] ([facility]) [address] [place], [place] [phone] Accepts Medicaid  [facility] [address]  [place] [phone] Substance Abuse/Addiction Treatment   [facility] Organization         Address  Phone  Notes  [facility]  [phone]   Angie Fava, PhD [address] Ervin Knack [place], [place]   [phone] or [phone]   [facility] Behavioral   [address] [place], [place] [phone]   [facility] [address], [place], [place] [phone] Insurance/Medicaid/sponsorship through [facility] and Families [address]                                    [place], [place] [phone] Therapy/tele-psych/case  [facility] [address], [place] [phone]    Dr. Lolly Mustache  [phone]   [facility]  [employer] [facility] Dept. 1) [address], [place] 2) [address], [place] 3)  [address], [place] [phone]  [phone]   [facility] Child Abuse Hotline 210-667-4992 or [phone] (After Hours)

## 2015-12-07 NOTE — ED Notes (Signed)
PA at bedside.

## 2015-12-07 NOTE — ED Notes (Signed)
Reports over a month of cough and URI sx. Reports chest feels "tight" at times but denies any current pain

## 2016-01-06 ENCOUNTER — Encounter (HOSPITAL_BASED_OUTPATIENT_CLINIC_OR_DEPARTMENT_OTHER): Payer: Self-pay | Admitting: Emergency Medicine

## 2016-01-06 ENCOUNTER — Emergency Department (HOSPITAL_BASED_OUTPATIENT_CLINIC_OR_DEPARTMENT_OTHER)
Admission: EM | Admit: 2016-01-06 | Discharge: 2016-01-06 | Disposition: A | Discharge status: Home / Self Care | Payer: No Typology Code available for payment source | Attending: Emergency Medicine | Admitting: Emergency Medicine

## 2016-01-06 DIAGNOSIS — Y998 Other external cause status: Secondary | ICD-10-CM | POA: Insufficient documentation

## 2016-01-06 DIAGNOSIS — E669 Obesity, unspecified: Secondary | ICD-10-CM | POA: Insufficient documentation

## 2016-01-06 DIAGNOSIS — Y9389 Activity, other specified: Secondary | ICD-10-CM | POA: Diagnosis not present

## 2016-01-06 DIAGNOSIS — M545 Low back pain: Secondary | ICD-10-CM

## 2016-01-06 DIAGNOSIS — Y92410 Unspecified street and highway as the place of occurrence of the external cause: Secondary | ICD-10-CM | POA: Insufficient documentation

## 2016-01-06 DIAGNOSIS — I10 Essential (primary) hypertension: Secondary | ICD-10-CM | POA: Insufficient documentation

## 2016-01-06 DIAGNOSIS — S3992XA Unspecified injury of lower back, initial encounter: Secondary | ICD-10-CM | POA: Insufficient documentation

## 2016-01-06 DIAGNOSIS — Z7952 Long term (current) use of systemic steroids: Secondary | ICD-10-CM | POA: Diagnosis not present

## 2016-01-06 DIAGNOSIS — Z791 Long term (current) use of non-steroidal anti-inflammatories (NSAID): Secondary | ICD-10-CM | POA: Diagnosis not present

## 2016-01-06 DIAGNOSIS — Z8669 Personal history of other diseases of the nervous system and sense organs: Secondary | ICD-10-CM | POA: Insufficient documentation

## 2016-01-06 DIAGNOSIS — S0990XA Unspecified injury of head, initial encounter: Secondary | ICD-10-CM | POA: Insufficient documentation

## 2016-01-06 DIAGNOSIS — Z8639 Personal history of other endocrine, nutritional and metabolic disease: Secondary | ICD-10-CM | POA: Diagnosis not present

## 2016-01-06 DIAGNOSIS — Z79899 Other long term (current) drug therapy: Secondary | ICD-10-CM | POA: Diagnosis not present

## 2016-01-06 MED ORDER — IBUPROFEN 400 MG PO TABS
600.0000 mg | ORAL_TABLET | Freq: Once | ORAL | Status: AC
  Administered 2016-01-06: 600 mg via ORAL
  Filled 2016-01-06: qty 1
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
## 2016-02-25 ENCOUNTER — Inpatient Hospital Stay (HOSPITAL_COMMUNITY)
Admission: EM | Admit: 2016-02-25 | Discharge: 2016-02-25 | Disposition: A | Discharge status: Home / Self Care | Payer: No Typology Code available for payment source | Attending: Family Medicine | Admitting: Family Medicine

## 2016-02-25 ENCOUNTER — Encounter (HOSPITAL_COMMUNITY): Payer: Self-pay | Admitting: Emergency Medicine

## 2016-02-25 DIAGNOSIS — D500 Iron deficiency anemia secondary to blood loss (chronic): Secondary | ICD-10-CM

## 2016-02-25 DIAGNOSIS — D509 Iron deficiency anemia, unspecified: Secondary | ICD-10-CM | POA: Insufficient documentation

## 2016-02-25 DIAGNOSIS — E785 Hyperlipidemia, unspecified: Secondary | ICD-10-CM | POA: Insufficient documentation

## 2016-02-25 DIAGNOSIS — I10 Essential (primary) hypertension: Secondary | ICD-10-CM | POA: Insufficient documentation

## 2016-02-25 DIAGNOSIS — N939 Abnormal uterine and vaginal bleeding, unspecified: Secondary | ICD-10-CM

## 2016-02-25 DIAGNOSIS — Z79899 Other long term (current) drug therapy: Secondary | ICD-10-CM | POA: Insufficient documentation

## 2016-02-25 LAB — BASIC METABOLIC PANEL
ANION GAP: 7 (ref 5–15)
BUN: 8 mg/dL (ref 6–20)
CHLORIDE: 108 mmol/L (ref 101–111)
CO2: 25 mmol/L (ref 22–32)
Calcium: 9.1 mg/dL (ref 8.9–10.3)
Creatinine, Ser: 0.63 mg/dL (ref 0.44–1.00)
GFR calc non Af Amer: >60 mL/min (ref >60)
Glucose, Bld: 72 mg/dL (ref 65–99)
POTASSIUM: 3.8 mmol/L (ref 3.5–5.1)
SODIUM: 140 mmol/L (ref 135–145)

## 2016-02-25 LAB — URINALYSIS, ROUTINE W REFLEX MICROSCOPIC
Bilirubin Urine: NEGATIVE
GLUCOSE, UA: NEGATIVE mg/dL
Ketones, ur: NEGATIVE mg/dL
Leukocytes, UA: NEGATIVE
Nitrite: NEGATIVE
PROTEIN: 30 mg/dL — AB
SPECIFIC GRAVITY, URINE: 1.015 (ref 1.005–1.030)
pH: 7 (ref 5.0–8.0)

## 2016-02-25 LAB — URINE MICROSCOPIC-ADD ON

## 2016-02-25 LAB — CBC
HEMATOCRIT: 26.4 % — AB (ref 36.0–46.0)
HEMOGLOBIN: 8.4 g/dL — AB (ref 12.0–15.0)
MCH: 20.4 pg — ABNORMAL LOW (ref 26.0–34.0)
MCHC: 31.8 g/dL (ref 30.0–36.0)
MCV: 64.2 fL — ABNORMAL LOW (ref 78.0–100.0)
Platelets: 380 10*3/uL (ref 150–400)
RBC: 4.11 MIL/uL (ref 3.87–5.11)
RDW: 17.3 % — ABNORMAL HIGH (ref 11.5–15.5)
WBC: 10.7 10*3/uL — AB (ref 4.0–10.5)

## 2016-02-25 LAB — TSH: TSH: 2.274 u[IU]/mL (ref 0.350–4.500)

## 2016-02-25 LAB — POCT PREGNANCY, URINE: PREG TEST UR: NEGATIVE

## 2016-02-25 MED ORDER — TRIAMTERENE-HCTZ 37.5-25 MG PO CAPS: 1.0000 | ORAL_CAPSULE | Freq: Every day | ORAL | Status: DC

## 2016-02-25 MED ORDER — FERROUS SULFATE 325 (65 FE) MG PO TABS: 325.0000 mg | ORAL_TABLET | Freq: Two times a day (BID) | ORAL | Status: DC

## 2016-02-25 MED ORDER — MEDROXYPROGESTERONE ACETATE 5 MG PO TABS: 5.0000 mg | ORAL_TABLET | Freq: Every day | ORAL | Status: DC

## 2016-02-25 NOTE — Discharge Instructions (Signed)

## 2016-02-25 NOTE — MAU Note (Signed)
PT  SAYS  HER LMP  WAS 3-18   AND  HAS  CONTINUED  TO BLEED.     CYCLES  ARE  IRREG-  SOME  MTHS  NO CYCLE.

## 2016-02-25 NOTE — MAU Provider Note (Signed)
History     CSN: [id number]  Arrival date and time: 02/25/16 1725   First Provider Initiated Contact with Patient 02/25/16 2040      Chief Complaint  Patient presents with  . Vaginal Bleeding   HPI  HPI per Grace Daniels CNM  Grace Daniels is a 25 y.o. nulligravid morbidly obese BFpresenting with menorrhagia with onset 02/01/16. PMP was around January; before that around October. She states the bleeding has been continuous and is heavy at times with passage of large clots. She feels fatigued and weak but not orthostatic. Bleeding is associated with low back pain. No abd, back or other pain at present.  Menstrual history: Menarche 13 with irregular cycles ; duration of flow 2 wks; interval q2-4 months.   OB History    Gravida Para Term Preterm AB TAB SAB Ectopic Multiple Living   0         0      Past Medical History  Diagnosis Date  . Hypertension   . Hyperlipidemia   . Sleep apnea     History reviewed. No pertinent past surgical history.  History reviewed. No pertinent family history.  Social History  Substance Use Topics  . Smoking status: Never Smoker   . Smokeless tobacco: Never Used  . Alcohol Use: No    Allergies: No Known Allergies  Prescriptions prior to admission  Medication Sig Dispense Refill Last Dose  . loratadine (CLARITIN) 10 MG tablet Take 10 mg by mouth daily as needed for allergies.   Past Week at Unknown time  . triamterene-hydrochlorothiazide (DYAZIDE) 37.5-25 MG capsule Take 1 each (1 capsule total) by mouth every morning. (Patient taking differently: Take 1 capsule by mouth daily. ) 30 capsule 0 02/24/2016 at Unknown time    Review of Systems  Constitutional: Positive for malaise/fatigue.  Respiratory: Negative.   Cardiovascular: Negative.   Gastrointestinal: Negative.   Genitourinary:       + vaginal bleeding  Musculoskeletal: Positive for back pain.  Neurological: Negative for headaches.   Physical Exam   Blood pressure 136/74,  pulse 89, temperature 98.4 F (36.9 C), temperature source Oral, resp. rate 20, height 5\' 10"  (1.778 m), weight 392 lb (177.81 kg), last menstrual period 02/01/2016, SpO2 99 %.  Physical Exam  Nursing note and vitals reviewed. Constitutional: She is oriented to person, place, and time. She appears well-developed and well-nourished. No distress.  HENT:  Head: Normocephalic and atraumatic.  Eyes: Conjunctivae are normal. Right eye exhibits no discharge. Left eye exhibits no discharge. No scleral icterus.  Neck: Normal range of motion.  Cardiovascular: Normal rate, regular rhythm and normal heart sounds.   No murmur heard. Respiratory: Effort normal and breath sounds normal. No respiratory distress. She has no wheezes.  GI: Soft. Bowel sounds are normal. She exhibits no distension. There is no tenderness. There is no rebound.  Genitourinary: Cervix exhibits no motion tenderness and no friability. There is bleeding (small amount of dark red bleeding) in the vagina.  Unable to palpate uterus d/t body habitus  Neurological: She is alert and oriented to person, place, and time.  Skin: Skin is warm and dry. She is not diaphoretic.  Psychiatric: She has a normal mood and affect. Her behavior is normal. Judgment and thought content normal.    MAU Course  Procedures Results for orders placed or performed during the hospital encounter of 02/25/16 (from the past 24 hour(s))  Urinalysis, Routine w reflex microscopic ([facility])     Status:  Abnormal   Collection Time: 02/25/16  7:20 PM  Result Value Ref Range   Color, Urine YELLOW YELLOW   APPearance HAZY (A) CLEAR   Specific Gravity, Urine 1.015 1.005 - 1.030   pH 7.0 5.0 - 8.0   Glucose, UA NEGATIVE NEGATIVE mg/dL   Hgb urine dipstick LARGE (A) NEGATIVE   Bilirubin Urine NEGATIVE NEGATIVE   Ketones, ur NEGATIVE NEGATIVE mg/dL   Protein, ur 30 (A) NEGATIVE mg/dL   Nitrite NEGATIVE NEGATIVE   Leukocytes, UA NEGATIVE NEGATIVE  Urine  microscopic-add on     Status: Abnormal   Collection Time: 02/25/16  7:20 PM  Result Value Ref Range   Squamous Epithelial / LPF 0-5 (A) NONE SEEN   WBC, UA 0-5 0 - 5 WBC/hpf   RBC / HPF TOO NUMEROUS TO COUNT 0 - 5 RBC/hpf   Bacteria, UA FEW (A) NONE SEEN  Pregnancy, urine POC     Status: None   Collection Time: 02/25/16  7:35 PM  Result Value Ref Range   Preg Test, Ur NEGATIVE NEGATIVE  CBC     Status: Abnormal   Collection Time: 02/25/16  8:44 PM  Result Value Ref Range   WBC 10.7 (H) 4.0 - 10.5 K/uL   RBC 4.11 3.87 - 5.11 MIL/uL   Hemoglobin 8.4 (L) 12.0 - 15.0 g/dL   HCT 16.1 (L) 09.6 - 04.5 %   MCV 64.2 (L) 78.0 - 100.0 fL   MCH 20.4 (L) 26.0 - 34.0 pg   MCHC 31.8 30.0 - 36.0 g/dL   RDW 40.9 (H) 81.1 - 91.4 %   Platelets 380 150 - 400 K/uL  TSH     Status: None   Collection Time: 02/25/16  8:44 PM  Result Value Ref Range   TSH 2.274 0.350 - 4.500 uIU/mL  Basic metabolic panel     Status: None   Collection Time: 02/25/16  8:44 PM  Result Value Ref Range   Sodium 140 135 - 145 mmol/L   Potassium 3.8 3.5 - 5.1 mmol/L   Chloride 108 101 - 111 mmol/L   CO2 25 22 - 32 mmol/L   Glucose, Bld 72 65 - 99 mg/dL   BUN 8 6 - 20 mg/dL   Creatinine, Ser 7.82 0.44 - 1.00 mg/dL   Calcium 9.1 8.9 - 95.6 mg/dL   GFR calc non Af Amer >60 >60 mL/min   GFR calc Af Amer >60 >60 mL/min   Anion gap 7 5 - 15    MDM UPT negative VSS Pt states ran out of BP med yesterday & currently doesn't have PCP d/t lack of insurance. Will refill one month of her meds & give information for [facility]  Assessment and Plan  A: 1. Abnormal uterine bleeding (AUB)   2. Iron deficiency anemia due to chronic blood loss   3. Chronic hypertension     P; Discharge home Rx provera, iron, dyazide Call [facility] for routine care & HTN management Outpatient ultrasound ordered & msg sent to clinic to schedule f/u appt Discussed reasons to return to MAU  Judeth Horn 02/25/2016, 10:11 PM

## 2016-02-25 NOTE — MAU Provider Note (Signed)
Johnney OuLashawn Y Huxford is a 25 y.o. nulligravid morbidly obese BFpresenting with menorrhagia with onset 02/01/16. PMP was around January; before that around October. She states the bleeding has been continuous and is heavy at times with passage of large clots. She feels fatigued and weak but not orthostatic. Bleeding is associated with low back pain. No abd, back or other pain at present.  Menstrual history: Menarche 13 with irregular cycles ; duration of flow 2 wks; interval q2-4 months.   Care assumed by Judeth HornErin Lawrence at [address] Hobbs, CNM 02/25/2016 9:09 PM

## 2016-02-25 NOTE — MAU Note (Signed)
Pt presents here after leaving [facility] ED after triage without being seen. Timer indicates patient has been waiting for 1 hour 30 minutes however pt did not sign in to MAU until 1848.

## 2016-02-25 NOTE — ED Notes (Signed)
Per pt, states she has been bleeding for over 3 weeks-states she has not seen GYN

## 2016-02-25 NOTE — MAU Note (Signed)
Pt denies other complaints at this time.

## 2016-02-25 NOTE — MAU Note (Signed)
Pt states her period has been on for a month. States she has been passing large blood clots the entire time. States she feels dizzy at times. Reports lower abd pain and lower back pain.

## 2016-03-05 ENCOUNTER — Encounter: Payer: Self-pay | Admitting: Obstetrics and Gynecology

## 2016-03-13 ENCOUNTER — Telehealth (HOSPITAL_COMMUNITY): Payer: Self-pay | Admitting: *Deleted

## 2016-03-14 ENCOUNTER — Inpatient Hospital Stay (HOSPITAL_COMMUNITY)
Admission: AD | Admit: 2016-03-14 | Discharge: 2016-03-14 | Disposition: A | Discharge status: Home / Self Care | Payer: Self-pay | Source: Ambulatory Visit | Attending: Obstetrics and Gynecology | Admitting: Obstetrics and Gynecology

## 2016-03-14 ENCOUNTER — Encounter (HOSPITAL_COMMUNITY): Payer: Self-pay | Admitting: *Deleted

## 2016-03-14 DIAGNOSIS — D62 Acute posthemorrhagic anemia: Secondary | ICD-10-CM

## 2016-03-14 DIAGNOSIS — G4730 Sleep apnea, unspecified: Secondary | ICD-10-CM | POA: Insufficient documentation

## 2016-03-14 DIAGNOSIS — I10 Essential (primary) hypertension: Secondary | ICD-10-CM | POA: Insufficient documentation

## 2016-03-14 DIAGNOSIS — E785 Hyperlipidemia, unspecified: Secondary | ICD-10-CM | POA: Insufficient documentation

## 2016-03-14 DIAGNOSIS — D649 Anemia, unspecified: Secondary | ICD-10-CM | POA: Insufficient documentation

## 2016-03-14 DIAGNOSIS — N946 Dysmenorrhea, unspecified: Secondary | ICD-10-CM | POA: Insufficient documentation

## 2016-03-14 DIAGNOSIS — N939 Abnormal uterine and vaginal bleeding, unspecified: Secondary | ICD-10-CM

## 2016-03-14 HISTORY — DX: Morbid (severe) obesity due to excess calories: E66.01

## 2016-03-14 LAB — URINE MICROSCOPIC-ADD ON
BACTERIA UA: NONE SEEN
WBC, UA: NONE SEEN WBC/hpf (ref 0–5)

## 2016-03-14 LAB — URINALYSIS, ROUTINE W REFLEX MICROSCOPIC
Bilirubin Urine: NEGATIVE
Glucose, UA: NEGATIVE mg/dL
Ketones, ur: NEGATIVE mg/dL
Leukocytes, UA: NEGATIVE
Nitrite: NEGATIVE
Protein, ur: NEGATIVE mg/dL
Specific Gravity, Urine: 1.01 (ref 1.005–1.030)
pH: 6 (ref 5.0–8.0)

## 2016-03-14 LAB — CBC
HEMATOCRIT: 26.7 % — AB (ref 36.0–46.0)
HEMOGLOBIN: 8.5 g/dL — AB (ref 12.0–15.0)
MCH: 19.9 pg — ABNORMAL LOW (ref 26.0–34.0)
MCHC: 31.8 g/dL (ref 30.0–36.0)
MCV: 62.4 fL — ABNORMAL LOW (ref 78.0–100.0)
Platelets: 394 10*3/uL (ref 150–400)
RBC: 4.28 MIL/uL (ref 3.87–5.11)
RDW: 17.2 % — ABNORMAL HIGH (ref 11.5–15.5)
WBC: 9.7 10*3/uL (ref 4.0–10.5)

## 2016-03-14 LAB — BASIC METABOLIC PANEL
ANION GAP: 4 — AB (ref 5–15)
BUN: 10 mg/dL (ref 6–20)
CO2: 24 mmol/L (ref 22–32)
Calcium: 8.7 mg/dL — ABNORMAL LOW (ref 8.9–10.3)
Chloride: 108 mmol/L (ref 101–111)
Creatinine, Ser: 0.78 mg/dL (ref 0.44–1.00)
GLUCOSE: 148 mg/dL — AB (ref 65–99)
POTASSIUM: 3.9 mmol/L (ref 3.5–5.1)
Sodium: 136 mmol/L (ref 135–145)

## 2016-03-14 LAB — TSH: TSH: 1.305 u[IU]/mL (ref 0.350–4.500)

## 2016-03-14 LAB — POCT PREGNANCY, URINE: PREG TEST UR: NEGATIVE

## 2016-03-14 MED ORDER — MEDROXYPROGESTERONE ACETATE 10 MG PO TABS
ORAL_TABLET | ORAL | Status: DC
Start: 2016-03-14 — End: 2017-08-02

## 2016-03-14 MED ORDER — IBUPROFEN 800 MG PO TABS
800.0000 mg | ORAL_TABLET | Freq: Once | ORAL | Status: AC
  Administered 2016-03-14: 800 mg via ORAL
  Filled 2016-03-14: qty 1

## 2016-03-14 NOTE — Discharge Instructions (Signed)
Menorrhagia Menorrhagia is the medical term for when your menstrual periods are heavy or last longer than usual. With menorrhagia, every period you have may cause enough blood loss and cramping that you are unable to maintain your usual activities. CAUSES  In some cases, the cause of heavy periods is unknown, but a number of conditions may cause menorrhagia. Common causes include:  A problem with the hormone-producing thyroid gland (hypothyroid).  Noncancerous growths in the uterus (polyps or fibroids).  An imbalance of the estrogen and progesterone hormones. One of your ovaries not releasing an eAnemia, Nonspecific Anemia is a condition in which the concentration of red blood cells or hemoglobin in the blood is below normal. Hemoglobin is a substance in red blood cells that carries oxygen to the tissues of the body. Anemia results in not enough oxygen reaching these tissues.  CAUSES  Common causes of anemia include:  Excessive bleeding. Bleeding may be internal or external. This includes excessive bleeding from periods (in women) or from the intestine.  Poor nutrition.  Chronic kidney, thyroid, and liver disease. Bone marrow disorders that decrease red blood cell production. Cancer and treatments for cancer. HIV, AIDS, and their treatments. Spleen problems that increase red blood cell destruction. Blood disorders. Excess destruction of red blood cells due to infection, medicines, and autoimmune disorders. SIGNS AND SYMPTOMS  Minor weakness.  Dizziness.  Headache. Palpitations.  Shortness of breath, especially with exercise.  Paleness. Cold sensitivity. Indigestion. Nausea. Difficulty sleeping. Difficulty concentrating. Symptoms may occur suddenly or they may develop slowly.  DIAGNOSIS  Additional blood tests are often needed. These help your health care provider determine the best treatment. Your health care provider will check your stool for blood and look for other  causes of blood loss.  TREATMENT  Treatment varies depending on the cause of the anemia. Treatment can include:  Supplements of iron, vitamin B12, or folic acid.  Hormone medicines.  A blood transfusion. This may be needed if blood loss is severe.  Hospitalization. This may be needed if there is significant continual blood loss.  Dietary changes. Spleen removal. HOME CARE INSTRUCTIONS Keep all follow-up appointments. It often takes many weeks to correct anemia, and having your health care provider check on your condition and your response to treatment is very important. SEEK IMMEDIATE MEDICAL CARE IF:  You develop extreme weakness, shortness of breath, or chest pain.  You become dizzy or have trouble concentrating. You develop heavy vaginal bleeding.  You develop a rash.  You have bloody or black, tarry stools.  You faint.  You vomit up blood.  You vomit repeatedly.  You have abdominal pain. You have a fever or persistent symptoms for more than 2-3 days.  You have a fever and your symptoms suddenly get worse.  You are dehydrated.  MAKE SURE YOU: Understand these instructions. Will watch your condition. Will get help right away if you are not doing well or get worse.   This information is not intended to replace advice given to you by your health care provider. Make sure you discuss any questions you have with your health care provider.   Document Released: 12/10/2004 Document Revised: 07/05/2013 Document Reviewed: 04/28/2013 Elsevier Interactive Patient Education [employer]2016 Elsevier Inc.  gg during one or more months.  Side effects of having an intrauterine device (IUD).  Side effects of some medicines, such as anti-inflammatory medicines or blood thinners.  A bleeding disorder that stops your blood from clotting normally. SIGNS AND SYMPTOMS  During a normal period,  bleeding lasts between 4 and 8 days. Signs that your periods are too heavy include:  You routinely  have to change your pad or tampon every 1 or 2 hours because it is completely soaked.  You pass blood clots larger than 1 inch (2.5 cm) in size.  You have bleeding for more than 7 days.  You need to use pads and tampons at the same time because of heavy bleeding.  You need to wake up to change your pads or tampons during the night.  You have symptoms of anemia, such as tiredness, fatigue, or shortness of breath. DIAGNOSIS  Your health care provider will perform a physical exam and ask you questions about your symptoms and menstrual history. Other tests may be ordered based on what the health care provider finds during the exam. These tests can include:  Blood tests. Blood tests are used to check if you are pregnant or have hormonal changes, a bleeding or thyroid disorder, low iron levels (anemia), or other problems.  Endometrial biopsy. Your health care provider takes a sample of tissue from the inside of your uterus to be examined under a microscope.  Pelvic ultrasound. This test uses sound waves to make a picture of your uterus, ovaries, and vagina. The pictures can show if you have fibroids or other growths.  Hysteroscopy. For this test, your health care provider will use a small telescope to look inside your uterus. Based on the results of your initial tests, your health care provider may recommend further testing. TREATMENT  Treatment may not be needed. If it is needed, your health care provider may recommend treatment with one or more medicines first. If these do not reduce bleeding enough, a surgical treatment might be an option. The best treatment for you will depend on:   Whether you need to prevent pregnancy.  Your desire to have children in the future.  The cause and severity of your bleeding.  Your opinion and personal preference.  Medicines for menorrhagia may include:  Birth control methods that use hormones. These include the pill, skin patch, vaginal ring, shots  that you get every 3 months, hormonal IUD, and implant. These treatments reduce bleeding during your menstrual period.  Medicines that thicken blood and slow bleeding.  Medicines that reduce swelling, such as ibuprofen.  Medicines that contain a synthetic hormone called progestin.   Medicines that make the ovaries stop working for a short time.  You may need surgical treatment for menorrhagia if the medicines are unsuccessful. Treatment options include:  Dilation and curettage (D&C). In this procedure, your health care provider opens (dilates) your cervix and then scrapes or suctions tissue from the lining of your uterus to reduce menstrual bleeding.  Operative hysteroscopy. This procedure uses a tiny tube with a light (hysteroscope) to view your uterine cavity and can help in the surgical removal of a polyp that may be causing heavy periods.  Endometrial ablation. Through various techniques, your health care provider permanently destroys the entire lining of your uterus (endometrium). After endometrial ablation, most women have little or no menstrual flow. Endometrial ablation reduces your ability to become pregnant.  Endometrial resection. This surgical procedure uses an electrosurgical wire loop to remove the lining of the uterus. This procedure also reduces your ability to become pregnant.  Hysterectomy. Surgical removal of the uterus and cervix is a permanent procedure that stops menstrual periods. Pregnancy is not possible after a hysterectomy. This procedure requires anesthesia and hospitalization. HOME CARE INSTRUCTIONS   Only  take over-the-counter or prescription medicines as directed by your health care provider. Take prescribed medicines exactly as directed. Do not change or switch medicines without consulting your health care provider.  Take any prescribed iron pills exactly as directed by your health care provider. Long-term heavy bleeding may result in low iron levels. Iron  pills help replace the iron your body lost from heavy bleeding. Iron may cause constipation. If this becomes a problem, increase the bran, fruits, and roughage in your diet.  Do not take aspirin or medicines that contain aspirin 1 week before or during your menstrual period. Aspirin may make the bleeding worse.  If you need to change your sanitary pad or tampon more than once every 2 hours, stay in bed and rest as much as possible until the bleeding stops.  Eat well-balanced meals. Eat foods high in iron. Examples are leafy green vegetables, meat, liver, eggs, and whole grain breads and cereals. Do not try to lose weight until the abnormal bleeding has stopped and your blood iron level is back to normal. SEEK MEDICAL CARE IF:   You soak through a pad or tampon every 1 or 2 hours, and this happens every time you have a period.  You need to use pads and tampons at the same time because you are bleeding so much.  You need to change your pad or tampon during the night.  You have a period that lasts for more than 8 days.  You pass clots bigger than 1 inch wide.  You have irregular periods that happen more or less often than once a month.  You feel dizzy or faint.  You feel very weak or tired.  You feel short of breath or feel your heart is beating too fast when you exercise.  You have nausea and vomiting or diarrhea while you are taking your medicine.  You have any problems that may be related to the medicine you are taking. SEEK IMMEDIATE MEDICAL CARE IF:   You soak through 4 or more pads or tampons in 2 hours.  You have any bleeding while you are pregnant. MAKE SURE YOU:   Understand these instructions.  Will watch your condition.  Will get help right away if you are not doing well or get worse.   This information is not intended to replace advice given to you by your health care provider. Make sure you discuss any questions you have with your health care provider.     Document Released: 11/02/2005 Document Revised: 11/07/2013 Document Reviewed: 04/23/2013 Elsevier Interactive Patient Education [employer]

## 2016-03-14 NOTE — MAU Provider Note (Signed)
MAU HISTORY AND PHYSICAL  Chief Complaint:  Vaginal Bleeding   Grace Daniels is a 25 y.o.  G0P0 with IUP at Unknown presenting for Vaginal Bleeding  Problem present for at least a month. Here for bleeding and pain. Bleeding persistently since March. Going through 6-10 pads a day. Mildly cramps as well, off and on for past month, present today. Seen here 2 weeks ago, started on provera and iron as found to be anemic. Took provera 5 mg daily for 10 days. That decreased bleeding but now it is back. History irregular periods. Denies history heavy bleeding, no history easy bleeding from other sites. Only regular med is dyazide.     Past Medical History  Diagnosis Date  . Hypertension   . Hyperlipidemia   . Sleep apnea   . Morbid obesity (HCC)     History reviewed. No pertinent past surgical history.  History reviewed. No pertinent family history.  Social History  Substance Use Topics  . Smoking status: Never Smoker   . Smokeless tobacco: Never Used  . Alcohol Use: No    No Known Allergies  Prescriptions prior to admission  Medication Sig Dispense Refill Last Dose  . triamterene-hydrochlorothiazide (DYAZIDE) 37.5-25 MG capsule Take 1 each (1 capsule total) by mouth daily. 30 capsule 0 03/14/2016 at Unknown time  . ferrous sulfate 325 (65 FE) MG tablet Take 1 tablet (325 mg total) by mouth 2 (two) times daily with a meal. (Patient not taking: Reported on 03/14/2016) 30 tablet 0 Not Taking at Unknown time  . medroxyPROGESTERone (PROVERA) 5 MG tablet Take 1 tablet (5 mg total) by mouth daily. (Patient not taking: Reported on 03/14/2016) 10 tablet 0 Not Taking at Unknown time  . triamterene-hydrochlorothiazide (DYAZIDE) 37.5-25 MG capsule Take 1 each (1 capsule total) by mouth every morning. (Patient not taking: Reported on 03/14/2016) 30 capsule 0 Not Taking at Unknown time    Review of Systems - Negative except for what is mentioned in HPI.  Physical Exam  Blood pressure 126/70,  pulse 105, temperature 97.3 F (36.3 C), temperature source Oral, resp. rate 16, last menstrual period 02/01/2016. GENERAL: Well-developed, well-nourished female in no acute distress. obese LUNGS: Clear to auscultation bilaterally.  HEART: Regular rate and rhythm. ABDOMEN: Soft, nontender, nondistended, gravid.  EXTREMITIES: Nontender, no edema, 2+ distal pulses. GU: blood in vagina, mild/mod. Some coming from closed os. No purulence, normal cervix, no cmt    Labs: Results for orders placed or performed during the hospital encounter of 03/14/16 (from the past 24 hour(s))  Urinalysis, Routine w reflex microscopic (not at [facility])   Collection Time: 03/14/16  3:34 PM  Result Value Ref Range   Color, Urine RED (A) YELLOW   APPearance HAZY (A) CLEAR   Specific Gravity, Urine 1.010 1.005 - 1.030   pH 6.0 5.0 - 8.0   Glucose, UA NEGATIVE NEGATIVE mg/dL   Hgb urine dipstick LARGE (A) NEGATIVE   Bilirubin Urine NEGATIVE NEGATIVE   Ketones, ur NEGATIVE NEGATIVE mg/dL   Protein, ur NEGATIVE NEGATIVE mg/dL   Nitrite NEGATIVE NEGATIVE   Leukocytes, UA NEGATIVE NEGATIVE  Urine microscopic-add on   Collection Time: 03/14/16  3:34 PM  Result Value Ref Range   Squamous Epithelial / LPF 0-5 (A) NONE SEEN   WBC, UA NONE SEEN 0 - 5 WBC/hpf   RBC / HPF TOO NUMEROUS TO COUNT 0 - 5 RBC/hpf   Bacteria, UA NONE SEEN NONE SEEN  Pregnancy, urine POC   Collection Time: 03/14/16  3:59 PM  Result Value Ref Range   Preg Test, Ur NEGATIVE NEGATIVE  Basic metabolic panel   Collection Time: 03/14/16  4:26 PM  Result Value Ref Range   Sodium 136 135 - 145 mmol/L   Potassium 3.9 3.5 - 5.1 mmol/L   Chloride 108 101 - 111 mmol/L   CO2 24 22 - 32 mmol/L   Glucose, Bld 148 (H) 65 - 99 mg/dL   BUN 10 6 - 20 mg/dL   Creatinine, Ser 1.61 0.44 - 1.00 mg/dL   Calcium 8.7 (L) 8.9 - 10.3 mg/dL   GFR calc non Af Amer >60 >60 mL/min   GFR calc Af Amer >60 >60 mL/min   Anion gap 4 (L) 5 - 15  CBC   Collection Time:  03/14/16  4:26 PM  Result Value Ref Range   WBC 9.7 4.0 - 10.5 K/uL   RBC 4.28 3.87 - 5.11 MIL/uL   Hemoglobin 8.5 (L) 12.0 - 15.0 g/dL   HCT 09.6 (L) 04.5 - 40.9 %   MCV 62.4 (L) 78.0 - 100.0 fL   MCH 19.9 (L) 26.0 - 34.0 pg   MCHC 31.8 30.0 - 36.0 g/dL   RDW 81.1 (H) 91.4 - 78.2 %   Platelets 394 150 - 400 K/uL    Imaging Studies:  No results found.  Assessment: Grace Daniels is  25 y.o. G0P0 at Unknown presents with AUB and dysmenorrhea. On exam well-appearing, benign abdominal and bimanual exam, no signs infection on SSE. No n/v, tolerating po, bilateral pelvic pain intermittent for one month, low suspicion for acute process. Hemoglobin stable in mid 8s. UPT negative.  Plan: - outpatient OB f/u scheduled 5/15 w/ Dr. Jolayne Panther, u/s ordered but not yet scheduled - f/u a1c - increase medroxyprogesterone dose to 10 qd with option of increasing to 10 bid and then 20 bid - bleeding/anemia return precautions  Silvano Bilis 4/29/20175:03 PM

## 2016-03-14 NOTE — MAU Note (Signed)
Pt was seen about two weeks ago for a prolonged period.  Took a prescription to make it stop but said it has just gotten worse than before.  Has lower abdominal cramping that she rates an 8/10.

## 2016-03-16 LAB — GC/CHLAMYDIA PROBE AMP (~~LOC~~) NOT AT ARMC
CHLAMYDIA, DNA PROBE: NEGATIVE
NEISSERIA GONORRHEA: NEGATIVE

## 2016-03-16 LAB — HEMOGLOBIN A1C
HEMOGLOBIN A1C: 7.1 % — AB (ref 4.8–5.6)
MEAN PLASMA GLUCOSE: 157 mg/dL

## 2016-03-25 ENCOUNTER — Telehealth: Payer: Self-pay | Admitting: General Practice

## 2016-03-25 NOTE — Telephone Encounter (Signed)
Per Dr Ashok PallWouk, patient was recently seen in MAU for bleeding. Had A1c drawn which was 7.1 indicating diabetes. Patient needs to be made aware and should f/u in clinic for bleeding. Called patient and both numbers, no answer- unable to leave voicemail

## 2016-03-26 ENCOUNTER — Encounter: Payer: Self-pay | Admitting: Family Medicine

## 2016-03-30 ENCOUNTER — Ambulatory Visit (INDEPENDENT_AMBULATORY_CARE_PROVIDER_SITE_OTHER): Payer: Self-pay | Admitting: Obstetrics and Gynecology

## 2016-03-30 ENCOUNTER — Encounter: Payer: Self-pay | Admitting: Obstetrics and Gynecology

## 2016-03-30 ENCOUNTER — Telehealth: Payer: Self-pay

## 2016-03-30 VITALS — BP 152/74 | HR 116 | Wt 389.5 lb

## 2016-03-30 DIAGNOSIS — N939 Abnormal uterine and vaginal bleeding, unspecified: Secondary | ICD-10-CM

## 2016-03-30 NOTE — Progress Notes (Signed)
Patient ID: Grace Daniels, female   DOB: 02/26/1991, 25 y.o.   MRN: [id number] 25 yo G0 presenting today for the evaluation of abnormal uterine bleeding. Patient reports being diagnosed with PCOS at the age of 25. She reports irregular bleeding- menses 4 times yearly. They can last up to 2 weeks. However in March her period has lasted 2 months. She denies feeling dizzy, lightheaded or CP. She has been treating herself with provera with good success  Past Medical History  Diagnosis Date  . Hypertension   . Hyperlipidemia   . Sleep apnea   . Morbid obesity (HCC)    No past surgical history on file. No family history on file. Social History  Substance Use Topics  . Smoking status: Never Smoker   . Smokeless tobacco: Never Used  . Alcohol Use: No   ROS See pertinent in HPI  Blood pressure 152/74, pulse 116, weight 389 lb 8 oz (176.676 kg).  GENERAL: Well-developed, well-nourished female in no acute distress. Obese ABDOMEN: Soft, nontender, nondistended.  PELVIC: Not performed. EXTREMITIES: No cyanosis, clubbing, or edema, 2+ distal pulses.  A/P 25 yo with AUB - Continue provera - Pelvic ultrasound ordered - Discussed medical management with IUD. [employer] form completed today. Patient is very interested in IUD and will return for insertion

## 2016-04-03 ENCOUNTER — Ambulatory Visit (HOSPITAL_COMMUNITY)
Admission: RE | Admit: 2016-04-03 | Discharge: 2016-04-03 | Disposition: A | Discharge status: Home / Self Care | Payer: Self-pay | Source: Ambulatory Visit | Attending: Student | Admitting: Student

## 2016-04-03 DIAGNOSIS — N939 Abnormal uterine and vaginal bleeding, unspecified: Secondary | ICD-10-CM | POA: Insufficient documentation

## 2016-04-03 DIAGNOSIS — D500 Iron deficiency anemia secondary to blood loss (chronic): Secondary | ICD-10-CM | POA: Insufficient documentation

## 2016-04-03 DIAGNOSIS — E282 Polycystic ovarian syndrome: Secondary | ICD-10-CM | POA: Insufficient documentation

## 2016-04-06 ENCOUNTER — Encounter: Payer: Self-pay | Admitting: General Practice

## 2016-04-06 NOTE — Telephone Encounter (Signed)
Called pt to inform her that she needs to bring either a fax return for prior year, IRS statment, household pay stubs, or leatter of means support. Pt did not answer or have a mailbox set up.

## 2016-04-06 NOTE — Telephone Encounter (Signed)
Called patient, no answer- unable to leave message. Will send letter.  

## 2016-04-08 ENCOUNTER — Telehealth: Payer: Self-pay | Admitting: *Deleted

## 2016-04-08 DIAGNOSIS — N939 Abnormal uterine and vaginal bleeding, unspecified: Secondary | ICD-10-CM

## 2016-04-08 MED ORDER — MEGESTROL ACETATE 40 MG PO TABS: 40.0000 mg | ORAL_TABLET | Freq: Two times a day (BID) | ORAL | Status: DC

## 2016-04-08 NOTE — Telephone Encounter (Signed)
Patient called back into front office asking about ultrasound results. Informed patient of results. Patient verbalized understanding & asked what we were going to do to stop this bleeding. Patient reports taking provera twice a day. Per Dr Jolayne Pantheronstant, Rx for megace 40mg  BID. Informed patient & also discussed need to bring in proof of income before IUD application can be sent. Patient verbalized understanding to all & had no questions

## 2016-04-08 NOTE — Telephone Encounter (Signed)
Pt left message requesting a call back.  She did not state her question or problem.

## 2016-04-20 ENCOUNTER — Telehealth: Payer: Self-pay | Admitting: Family Medicine

## 2016-04-20 DIAGNOSIS — I10 Essential (primary) hypertension: Secondary | ICD-10-CM

## 2016-04-20 MED ORDER — TRIAMTERENE-HCTZ 37.5-25 MG PO CAPS: 1.0000 | ORAL_CAPSULE | Freq: Every day | ORAL | Status: DC

## 2016-04-20 NOTE — Telephone Encounter (Signed)
Called patient and asked if she had a PCP and where she was getting her previous BP meds. Patient states she has always got her BP medication from different doctors in the ER. Discussed with patient need for PCP and provided info for [facility]. Per Dr Erin FullingHarraway Versteeg, may give one additional refill until patient can get into PCP. Informed patient this would be the last refill & future refills will need to come from PCP. Patient verbalized understanding to all & had no questions

## 2016-04-20 NOTE — Telephone Encounter (Signed)
Calling to get a med refill  [facility]

## 2016-04-21 ENCOUNTER — Telehealth: Payer: Self-pay

## 2016-04-21 NOTE — Telephone Encounter (Signed)
Pt stop by to drop off her Mirena Application. Paper work has been fax and sent to [employer]

## 2016-07-08 ENCOUNTER — Telehealth: Payer: Self-pay | Admitting: *Deleted

## 2016-07-08 NOTE — Telephone Encounter (Signed)
Patient called for refill on her Dyazide 37.5-25 mg 1 tablet daily.Last refilled by Dr Erin FullingHarraway-Farquharson.Patient has appointment with a new PCP on 07/27/16.Patient given refill to last until appointment with her new PCP in September.

## 2016-07-27 ENCOUNTER — Ambulatory Visit (INDEPENDENT_AMBULATORY_CARE_PROVIDER_SITE_OTHER): Payer: Self-pay | Admitting: Family Medicine

## 2016-07-27 ENCOUNTER — Encounter: Payer: Self-pay | Admitting: Family Medicine

## 2016-07-27 VITALS — BP 130/80 | HR 102 | Temp 98.0°F | Ht 70.0 in | Wt 380.0 lb

## 2016-07-27 DIAGNOSIS — R739 Hyperglycemia, unspecified: Secondary | ICD-10-CM

## 2016-07-27 DIAGNOSIS — D649 Anemia, unspecified: Secondary | ICD-10-CM

## 2016-07-27 DIAGNOSIS — R7309 Other abnormal glucose: Secondary | ICD-10-CM

## 2016-07-27 DIAGNOSIS — I10 Essential (primary) hypertension: Secondary | ICD-10-CM

## 2016-07-27 LAB — COMPLETE METABOLIC PANEL WITH GFR
ALBUMIN: 3.9 g/dL (ref 3.6–5.1)
ALK PHOS: 43 U/L (ref 33–115)
ALT: 7 U/L (ref 6–29)
AST: 9 U/L — AB (ref 10–30)
BILIRUBIN TOTAL: 0.2 mg/dL (ref 0.2–1.2)
BUN: 7 mg/dL (ref 7–25)
CO2: 23 mmol/L (ref 20–31)
CREATININE: 0.66 mg/dL (ref 0.50–1.10)
Calcium: 9.2 mg/dL (ref 8.6–10.2)
Chloride: 108 mmol/L (ref 98–110)
GFR, Est African American: >89 mL/min (ref >=60)
GFR, Est Non African American: >89 mL/min (ref >=60)
GLUCOSE: 109 mg/dL — AB (ref 65–99)
Potassium: 4.5 mmol/L (ref 3.5–5.3)
SODIUM: 138 mmol/L (ref 135–146)
TOTAL PROTEIN: 7 g/dL (ref 6.1–8.1)

## 2016-07-27 LAB — POCT GLYCOSYLATED HEMOGLOBIN (HGB A1C): HEMOGLOBIN A1C: 5.6

## 2016-07-27 MED ORDER — TRIAMTERENE-HCTZ 37.5-25 MG PO CAPS: 1.0000 | ORAL_CAPSULE | Freq: Every day | ORAL | 0 refills | Status: DC

## 2016-07-27 MED ORDER — METFORMIN HCL 500 MG PO TABS: 500.0000 mg | ORAL_TABLET | Freq: Two times a day (BID) | ORAL | 3 refills | Status: DC

## 2016-07-27 NOTE — Patient Instructions (Addendum)
Diabetes Mellitus and Food It is important for you to manage your blood sugar (glucose) level. Your blood glucose level can be greatly affected by what you eat. Eating healthier foods in the appropriate amounts throughout the day at about the same time each day will help you control your blood glucose level. It can also help slow or prevent worsening of your diabetes mellitus. Healthy eating may even help you improve the level of your blood pressure and reach or maintain a healthy weight.  General recommendations for healthful eating and cooking habits include:  Eating meals and snacks regularly. Avoid going long periods of time without eating to lose weight.  Eating a diet that consists mainly of plant-based foods, such as fruits, vegetables, nuts, legumes, and whole grains.  Using low-heat cooking methods, such as baking, instead of high-heat cooking methods, such as deep frying. Work with your dietitian to make sure you understand how to use the Nutrition Facts information on food labels. HOW CAN FOOD AFFECT ME? Carbohydrates Carbohydrates affect your blood glucose level more than any other type of food. Your dietitian will help you determine how many carbohydrates to eat at each meal and teach you how to count carbohydrates. Counting carbohydrates is important to keep your blood glucose at a healthy level, especially if you are using insulin or taking certain medicines for diabetes mellitus. Alcohol Alcohol can cause sudden decreases in blood glucose (hypoglycemia), especially if you use insulin or take certain medicines for diabetes mellitus. Hypoglycemia can be a life-threatening condition. Symptoms of hypoglycemia (sleepiness, dizziness, and disorientation) are similar to symptoms of having too much alcohol.  If your health care provider has given you approval to drink alcohol, do so in moderation and use the following guidelines:  Women should not have more than one drink per day, and men  should not have more than two drinks per day. One drink is equal to:  12 oz of beer.  5 oz of wine.  1 oz of hard liquor.  Do not drink on an empty stomach.  Keep yourself hydrated. Have water, diet soda, or unsweetened iced tea.  Regular soda, juice, and other mixers might contain a lot of carbohydrates and should be counted. WHAT FOODS ARE NOT RECOMMENDED? As you make food choices, it is important to remember that all foods are not the same. Some foods have fewer nutrients per serving than other foods, even though they might have the same number of calories or carbohydrates. It is difficult to get your body what it needs when you eat foods with fewer nutrients. Examples of foods that you should avoid that are high in calories and carbohydrates but low in nutrients include:  Trans fats (most processed foods list trans fats on the Nutrition Facts label).  Regular soda.  Juice.  Candy.  Sweets, such as cake, pie, doughnuts, and cookies.  Fried foods. WHAT FOODS CAN I EAT? Eat nutrient-rich foods, which will nourish your body and keep you healthy. The food you should eat also will depend on several factors, including:  The calories you need.  The medicines you take.  Your weight.  Your blood glucose level.  Your blood pressure level.  Your cholesterol level. You should eat a variety of foods, including:  Protein.  Lean cuts of meat.  Proteins low in saturated fats, such as fish, egg whites, and beans. Avoid processed meats.  Fruits and vegetables.  Fruits and vegetables that may help control blood glucose levels, such as apples, mangoes, and   yams.  Dairy products.  Choose fat-free or low-fat dairy products, such as milk, yogurt, and cheese.  Grains, bread, pasta, and rice.  Choose whole grain products, such as multigrain bread, whole oats, and brown rice. These foods may help control blood pressure.  Fats.  Foods containing healthful fats, such as nuts,  avocado, olive oil, canola oil, and fish. DOES EVERYONE WITH DIABETES MELLITUS HAVE THE SAME MEAL PLAN? Because every person with diabetes mellitus is different, there is not one meal plan that works for everyone. It is very important that you meet with a dietitian who will help you create a meal plan that is just right for you.  You have borderline diabetes Please fill and take metformin 500 mg twice a day with a meal.  Come back in 3 months for recheck.       This information is not intended to replace advice given to you by your health care provider. Make sure you discuss any questions you have with your health care provider.   Document Released: 07/30/2005 Document Revised: 11/23/2014 Document Reviewed: 09/29/2013 Elsevier Interactive Patient Education [employer]2016 Elsevier Inc.

## 2016-07-28 LAB — CBC WITH DIFFERENTIAL/PLATELET
BASOS ABS: 0 {cells}/uL (ref 0–200)
BASOS PCT: 0 %
EOS PCT: 1 %
Eosinophils Absolute: 84 cells/uL (ref 15–500)
HEMATOCRIT: 25.7 % — AB (ref 35.0–45.0)
HEMOGLOBIN: 7.1 g/dL — AB (ref 11.7–15.5)
LYMPHS ABS: 3528 {cells}/uL (ref 850–3900)
Lymphocytes Relative: 42 %
MCH: 14.6 pg — ABNORMAL LOW (ref 27.0–33.0)
MCHC: 27 g/dL — ABNORMAL LOW (ref 32.0–36.0)
MCV: 53 fL — ABNORMAL LOW (ref 80.0–100.0)
MONO ABS: 672 {cells}/uL (ref 200–950)
Monocytes Relative: 8 %
NEUTROS ABS: 4116 {cells}/uL (ref 1500–7800)
Neutrophils Relative %: 49 %
Platelets: 478 10*3/uL — ABNORMAL HIGH (ref 140–400)
RBC: 4.86 MIL/uL (ref 3.80–5.10)
RDW: 23 % — ABNORMAL HIGH (ref 11.0–15.0)
WBC: 8.4 10*3/uL (ref 3.8–10.8)

## 2016-07-29 NOTE — Progress Notes (Signed)
Grace Daniels, is a 25 y.o. female  [id number]  [id number]  DOB - 12/18/1990  CC:  Chief Complaint  Patient presents with  . New Patient (Initial Visit)    needs to get established, needs refills, and complains of stomach cramps which come and go for the past 6 months, worried she has diabetes, and has cpap machine but does not use it due to lack of having all the equipment       HPI: Grace Daniels is a 25 y.o. female here to establish care. She has a record of an A1C 5357f 7.1 a few months ago but this was never addressed. She is worried about this. She complains of frequent stomach cramping for last several months. She as sleep apnea but is currently not using due to lack of proper equipment and no money to afford. She also has a history of HTN, hyperlipidemia and morbid obesity.She also has a history of anemia.   Health maintenance; She declines flu shot and Tdap today. She reports having a PAP a couple of months ago.   No Known Allergies Past Medical History:  Diagnosis Date  . Hyperlipidemia   . Hypertension   . Morbid obesity (HCC)   . Sleep apnea    Current Outpatient Prescriptions on File Prior to Visit  Medication Sig Dispense Refill  . ferrous sulfate 325 (65 FE) MG tablet Take 1 tablet (325 mg total) by mouth 2 (two) times daily with a meal. (Patient not taking: Reported on 07/27/2016) 30 tablet 0  . medroxyPROGESTERone (PROVERA) 10 MG tablet One tab by mouth daily. Can increase to one tab twice a day, and then to 2 tabs twice a day, as needed for bleeding (Patient not taking: Reported on 07/27/2016) 30 tablet 2  . megestrol (MEGACE) 40 MG tablet Take 1 tablet (40 mg total) by mouth 2 (two) times daily. (Patient not taking: Reported on 07/27/2016) 60 tablet 1   No current facility-administered medications on file prior to visit.    History reviewed. No pertinent family history. Social History   Social History  . Marital status: Single    Spouse name: N/A  .  Number of children: N/A  . Years of education: N/A   Occupational History  . Not on file.   Social History Main Topics  . Smoking status: Never Smoker  . Smokeless tobacco: Never Used  . Alcohol use No  . Drug use: No  . Sexual activity: No   Other Topics Concern  . Not on file   Social History Narrative  . No narrative on file    Review of Systems: Constitutional: Negative Skin: Negative HENT: Negative  Eyes: Negative  Neck: Negative Respiratory: Negative Cardiovascular: Negative, except for occ palpitations Gastrointestinal: Positive for intestinal cramping Genitourinary: Negative  Musculoskeletal: Positive for low back pain Neurological: Positive for occ headaches Hematological: Negative  Psychiatric/Behavioral: Negative    Objective:   Vitals:   07/27/16 1031  BP: 130/80  Pulse: (!) 102  Temp: 98 F (36.7 C)    Physical Exam: Constitutional: Patient appears well-developed and well-nourished. No distress.Morbid obese HENT: Normocephalic, atraumatic, External right and left ear normal. Oropharynx is clear and moist.  Eyes: Conjunctivae and EOM are normal. PERRLA, no scleral icterus. Neck: Normal ROM. Neck supple. No lymphadenopathy, No thyromegaly. CVS: RRR, S1/S2 +, no murmurs, no gallops, no rubs Pulmonary: Effort and breath sounds normal, no stridor, rhonchi, wheezes, rales.  Abdominal: Soft. Normoactive BS,, no distension, tenderness, rebound or guarding.  Musculoskeletal:  Normal range of motion. No edema and no tenderness.  Neuro: Alert.Normal muscle tone coordination. Non-focal Skin: Skin is warm and dry. No rash noted. Not diaphoretic. No erythema. No pallor. Psychiatric: Normal mood and affect. Behavior, judgment, thought content normal.  Lab Results  Component Value Date   WBC 8.4 07/27/2016   HGB 7.1 (L) 07/27/2016   HCT 25.7 (L) 07/27/2016   MCV 53.0 (L) 07/27/2016   PLT 478 (H) 07/27/2016   Lab Results  Component Value Date    CREATININE 0.66 07/27/2016   BUN 7 07/27/2016   NA 138 07/27/2016   K 4.5 07/27/2016   CL 108 07/27/2016   CO2 23 07/27/2016    Lab Results  Component Value Date   HGBA1C 5.6 07/27/2016   Lipid Panel  No results found for: CHOL, TRIG, HDL, CHOLHDL, VLDL, LDLCALC     Assessment and plan:   1. Essential hypertension  - triamterene-hydrochlorothiazide (DYAZIDE) 37.5-25 MG capsule; Take 1 each (1 capsule total) by mouth daily.  Dispense: 30 capsule; Refill: 0 - COMPLETE METABOLIC PANEL WITH GFR  2. Anemia, unspecified anemia type  - CBC with Differential  3. Elevated blood sugar  - POCT glycosylated hemoglobin (Hb A1C)   Return in about 6 months (around 01/24/2017).  The patient was given clear instructions to go to ER or return to medical center if symptoms don't improve, worsen or new problems develop. The patient verbalized understanding.    Henrietta Hoover FNP  07/29/2016, 1:02 PM

## 2016-07-30 ENCOUNTER — Other Ambulatory Visit: Payer: Self-pay | Admitting: Family Medicine

## 2016-07-30 ENCOUNTER — Other Ambulatory Visit: Payer: Self-pay

## 2016-07-30 MED ORDER — FERROUS SULFATE 325 (65 FE) MG PO TABS: 325.0000 mg | ORAL_TABLET | Freq: Two times a day (BID) | ORAL | 2 refills | Status: DC

## 2016-07-30 NOTE — Telephone Encounter (Signed)
Sent in rx to pharmacy. Thanks!

## 2016-09-21 ENCOUNTER — Encounter (HOSPITAL_COMMUNITY): Payer: Self-pay | Admitting: Emergency Medicine

## 2016-09-21 ENCOUNTER — Ambulatory Visit (HOSPITAL_COMMUNITY)
Admission: EM | Admit: 2016-09-21 | Discharge: 2016-09-21 | Disposition: A | Discharge status: Home / Self Care | Payer: Self-pay | Attending: Family Medicine | Admitting: Family Medicine

## 2016-09-21 DIAGNOSIS — K529 Noninfective gastroenteritis and colitis, unspecified: Secondary | ICD-10-CM

## 2016-09-21 NOTE — ED Provider Notes (Signed)
[facility]    CSN: [id number] Arrival date & time: 09/21/16  1807     History   Chief Complaint Chief Complaint  Patient presents with  . Abdominal Pain  . Diarrhea    HPI Grace Daniels is a 25 y.o. female.   The history is provided by the patient.  Abdominal Pain  Pain location:  Generalized Pain quality: cramping   Pain radiates to:  Does not radiate Pain severity:  Mild Onset quality:  Sudden Duration:  5 days Progression:  Unchanged Chronicity:  New Context: suspicious food intake   Context: not sick contacts   Relieved by:  Flatus Worsened by:  Eating Ineffective treatments:  None tried Associated symptoms: diarrhea and nausea   Associated symptoms: no belching, no chest pain, no constipation, no fever and no vomiting   Diarrhea  Associated symptoms: abdominal pain   Associated symptoms: no fever and no vomiting     Past Medical History:  Diagnosis Date  . Hyperlipidemia   . Hypertension   . Morbid obesity (HCC)   . Sleep apnea     Patient Active Problem List   Diagnosis Date Noted  . Abnormal uterine bleeding (AUB) 03/30/2016    History reviewed. No pertinent surgical history.  OB History    Gravida Para Term Preterm AB Living   0         0   SAB TAB Ectopic Multiple Live Births                   Home Medications    Prior to Admission medications   Medication Sig Start Date End Date Taking? Authorizing Provider  ferrous sulfate 325 (65 FE) MG tablet Take 1 tablet (325 mg total) by mouth 2 (two) times daily with a meal. 07/30/16   Henrietta HooverLinda C Bernhardt, NP  medroxyPROGESTERone (PROVERA) 10 MG tablet One tab by mouth daily. Can increase to one tab twice a day, and then to 2 tabs twice a day, as needed for bleeding Patient not taking: Reported on 07/27/2016 03/14/16   Kathrynn RunningNoah Bedford Wouk, MD  megestrol (MEGACE) 40 MG tablet Take 1 tablet (40 mg total) by mouth 2 (two) times daily. Patient not taking: Reported on 07/27/2016 04/08/16    Catalina AntiguaPeggy Constant, MD  metFORMIN (GLUCOPHAGE) 500 MG tablet Take 1 tablet (500 mg total) by mouth 2 (two) times daily with a meal. 07/27/16   Henrietta HooverLinda C Bernhardt, NP  triamterene-hydrochlorothiazide (DYAZIDE) 37.5-25 MG capsule Take 1 each (1 capsule total) by mouth daily. 07/27/16   Henrietta HooverLinda C Bernhardt, NP    Family History No family history on file.  Social History Social History  Substance Use Topics  . Smoking status: Never Smoker  . Smokeless tobacco: Never Used  . Alcohol use No     Allergies   Patient has no known allergies.   Review of Systems Review of Systems  Constitutional: Negative.  Negative for fever.  HENT: Negative.   Respiratory: Negative.   Cardiovascular: Negative.  Negative for chest pain.  Gastrointestinal: Positive for abdominal pain, diarrhea and nausea. Negative for constipation and vomiting.  Genitourinary: Negative.   All other systems reviewed and are negative.    Physical Exam Triage Vital Signs ED Triage Vitals  Enc Vitals Group     BP 09/21/16 1822 143/65     Pulse Rate 09/21/16 1822 84     Resp 09/21/16 1822 17     Temp 09/21/16 1822 97.4 F (36.3 C)  Temp Source 09/21/16 1822 Oral     SpO2 09/21/16 1822 100 %     Weight 09/21/16 1823 (!) 376 lb (170.6 kg)     Height 09/21/16 1823 5\' 10"  (1.778 m)     Head Circumference --      Peak Flow --      Pain Score 09/21/16 1826 8     Pain Loc --      Pain Edu? --      Excl. in GC? --    No data found.   Updated Vital Signs BP 143/65 (BP Location: Left Arm)   Pulse 84   Temp 97.4 F (36.3 C) (Oral)   Resp 17   Ht 5\' 10"  (1.778 m)   Wt (!) 376 lb (170.6 kg)   LMP 05/15/2016   SpO2 100%   BMI 53.95 kg/m   Visual Acuity Right Eye Distance:   Left Eye Distance:   Bilateral Distance:    Right Eye Near:   Left Eye Near:    Bilateral Near:     Physical Exam  Constitutional: She appears well-developed and well-nourished. No distress.  Pulmonary/Chest: Effort normal and breath  sounds normal.  Abdominal: Soft. Normal appearance. She exhibits no distension and no mass. Bowel sounds are increased. There is no tenderness. There is no rebound and no guarding. No hernia.  Skin: Skin is warm and dry.  Nursing note and vitals reviewed.    UC Treatments / Results  Labs (all labs ordered are listed, but only abnormal results are displayed) Labs Reviewed - No data to display  EKG  EKG Interpretation None       Radiology No results found.  Procedures Procedures (including critical care time)  Medications Ordered in UC Medications - No data to display   Initial Impression / Assessment and Plan / UC Course  I have reviewed the triage vital signs and the nursing notes.  Pertinent labs & imaging results that were available during my care of the patient were reviewed by me and considered in my medical decision making (see chart for details).  Clinical Course       Final Clinical Impressions(s) / UC Diagnoses   Final diagnoses:  None    New Prescriptions New Prescriptions   No medications on file     Grace HoffJames D Kindl, MD 09/21/16 1905

## 2016-09-21 NOTE — ED Triage Notes (Signed)
Pt. Stated, I've had some abdominal pain since yesterday with some diarrhea.  No other symptoms.

## 2016-09-21 NOTE — Discharge Instructions (Signed)
Clear liquid , bland diet tonight as tolerated, advance on tues as improved, use probiotic as needed, return or see your doctor if any problems.

## 2016-10-26 ENCOUNTER — Ambulatory Visit (INDEPENDENT_AMBULATORY_CARE_PROVIDER_SITE_OTHER): Payer: Self-pay | Admitting: Family Medicine

## 2016-10-26 ENCOUNTER — Encounter: Payer: Self-pay | Admitting: Family Medicine

## 2016-10-26 DIAGNOSIS — I10 Essential (primary) hypertension: Secondary | ICD-10-CM

## 2016-10-26 LAB — CBC WITH DIFFERENTIAL/PLATELET
BASOS ABS: 0 {cells}/uL (ref 0–200)
BASOS PCT: 0 %
EOS PCT: 1 %
Eosinophils Absolute: 69 cells/uL (ref 15–500)
HCT: 30.5 % — ABNORMAL LOW (ref 35.0–45.0)
Hemoglobin: 8.5 g/dL — ABNORMAL LOW (ref 11.7–15.5)
LYMPHS PCT: 41 %
Lymphs Abs: 2829 cells/uL (ref 850–3900)
MCH: 15.3 pg — AB (ref 27.0–33.0)
MCHC: 27.9 g/dL — AB (ref 32.0–36.0)
MCV: 55.1 fL — ABNORMAL LOW (ref 80.0–100.0)
Monocytes Absolute: 621 cells/uL (ref 200–950)
Monocytes Relative: 9 %
NEUTROS PCT: 49 %
Neutro Abs: 3381 cells/uL (ref 1500–7800)
Platelets: 469 10*3/uL — ABNORMAL HIGH (ref 140–400)
RBC: 5.54 MIL/uL — ABNORMAL HIGH (ref 3.80–5.10)
RDW: 22 % — AB (ref 11.0–15.0)
WBC: 6.9 10*3/uL (ref 3.8–10.8)

## 2016-10-26 LAB — COMPLETE METABOLIC PANEL WITH GFR
ALBUMIN: 4 g/dL (ref 3.6–5.1)
ALK PHOS: 52 U/L (ref 33–115)
ALT: 8 U/L (ref 6–29)
AST: 12 U/L (ref 10–30)
BILIRUBIN TOTAL: 0.3 mg/dL (ref 0.2–1.2)
BUN: 9 mg/dL (ref 7–25)
CO2: 23 mmol/L (ref 20–31)
CREATININE: 0.64 mg/dL (ref 0.50–1.10)
Calcium: 9.4 mg/dL (ref 8.6–10.2)
Chloride: 108 mmol/L (ref 98–110)
GFR, Est African American: >89 mL/min (ref >=60)
GFR, Est Non African American: >89 mL/min (ref >=60)
GLUCOSE: 92 mg/dL (ref 65–99)
Potassium: 4.4 mmol/L (ref 3.5–5.3)
SODIUM: 138 mmol/L (ref 135–146)
TOTAL PROTEIN: 7.2 g/dL (ref 6.1–8.1)

## 2016-10-26 MED ORDER — TRIAMTERENE-HCTZ 37.5-25 MG PO CAPS: 1.0000 | ORAL_CAPSULE | Freq: Every day | ORAL | 0 refills | Status: DC

## 2016-10-26 NOTE — Patient Instructions (Signed)
Continue to watch carbs and fats in diet and work on further weight loss Try to increase exercise.

## 2016-10-26 NOTE — Progress Notes (Signed)
Grace Daniels, is a 25 y.o. female  [id number]:[id number]  [id number]  DOB - 11/10/1991  CC:  Chief Complaint  Patient presents with  . Diabetes    questional diabetes, last lab improved, but patient has metformin rx and is unaware and needs to know if you want her to use   . Hypertension    did not take medication today but normally does       HPI: Grace DailyLashawn Kavanaugh is a 25 y.o. female here for follow-up hypertension. When I saw her last she had recently had an A1C of 7. I prescribed metformin but she never started that. Her A1C 135/78. She did take her medication today. She report doing well without specific comp  Health Maintenance: Had a PAP earlier this year. She declines immunizations today.   No Known Allergies Past Medical History:  Diagnosis Date  . Hyperlipidemia   . Hypertension   . Morbid obesity (HCC)   . Sleep apnea    Current Outpatient Prescriptions on File Prior to Visit  Medication Sig Dispense Refill  . ferrous sulfate 325 (65 FE) MG tablet Take 1 tablet (325 mg total) by mouth 2 (two) times daily with a meal. (Patient not taking: Reported on 10/26/2016) 30 tablet 2  . medroxyPROGESTERone (PROVERA) 10 MG tablet One tab by mouth daily. Can increase to one tab twice a day, and then to 2 tabs twice a day, as needed for bleeding (Patient not taking: Reported on 10/26/2016) 30 tablet 2  . megestrol (MEGACE) 40 MG tablet Take 1 tablet (40 mg total) by mouth 2 (two) times daily. (Patient not taking: Reported on 10/26/2016) 60 tablet 1  . metFORMIN (GLUCOPHAGE) 500 MG tablet Take 1 tablet (500 mg total) by mouth 2 (two) times daily with a meal. (Patient not taking: Reported on 10/26/2016) 180 tablet 3   No current facility-administered medications on file prior to visit.    History reviewed. No pertinent family history. Social History   Social History  . Marital status: Single    Spouse name: N/A  . Number of children: N/A  . Years of education: N/A   Occupational  History  . Not on file.   Social History Main Topics  . Smoking status: Never Smoker  . Smokeless tobacco: Never Used  . Alcohol use No  . Drug use: No  . Sexual activity: No   Other Topics Concern  . Not on file   Social History Narrative  . No narrative on file    Review of Systems: Constitutional: + fatigue Skin: Negative HENT: Negative  Eyes: Negative  Neck: Negative Respiratory: Negative Cardiovascular: Negative Gastrointestinal: Negative Genitourinary: Negative  Musculoskeletal: Negative   Neurological: Negative for Hematological: + for easy bruising Psychiatric/Behavioral: Negative    Objective:   Vitals:   10/26/16 1124  BP: 135/78  Pulse: 94  Resp: 14  Temp: 97.5 F (36.4 C)    Physical Exam: Constitutional: Patient appears well-developed and well-nourished. No distress.Mobidly obese HENT: Normocephalic, atraumatic, External right and left ear normal. Oropharynx is clear and moist.  Eyes: Conjunctivae and EOM are normal. PERRLA, no scleral icterus. Neck: Normal ROM. Neck supple. No lymphadenopathy, No thyromegaly. CVS: RRR, S1/S2 +, no murmurs, no gallops, no rubs Pulmonary: Effort and breath sounds normal, no stridor, rhonchi, wheezes, rales.  Abdominal: Soft. Normoactive BS,, no distension, tenderness, rebound or guarding.  Musculoskeletal: Normal range of motion. No edema and no tenderness.  Neuro: Alert.Normal muscle tone coordination. Non-focal Skin: Skin is warm and dry.  No rash noted. Not diaphoretic. No erythema. No pallor. Psychiatric: Normal mood and affect. Behavior, judgment, thought content normal.  Lab Results  Component Value Date   WBC 8.4 07/27/2016   HGB 7.1 (L) 07/27/2016   HCT 25.7 (L) 07/27/2016   MCV 53.0 (L) 07/27/2016   PLT 478 (H) 07/27/2016   Lab Results  Component Value Date   CREATININE 0.66 07/27/2016   BUN 7 07/27/2016   NA 138 07/27/2016   K 4.5 07/27/2016   CL 108 07/27/2016   CO2 23 07/27/2016    Lab  Results  Component Value Date   HGBA1C 5.6 07/27/2016   Lipid Panel  No results found for: CHOL, TRIG, HDL, CHOLHDL, VLDL, LDLCALC      Assessment and plan:   1. Essential hypertension  - COMPLETE METABOLIC PANEL WITH GFR - CBC with Differential - triamterene-hydrochlorothiazide (DYAZIDE) 37.5-25 MG capsule; Take 1 each (1 capsule total) by mouth daily.  Dispense: 30 capsule; Refill: 0  2. Prediabetes -Continue low carb diet and weight loss -Will not start medication as long as A1C under 7.   Return in about 6 months (around 04/26/2017).  The patient was given clear instructions to go to ER or return to medical center if symptoms don't improve, worsen or new problems develop. The patient verbalized understanding.    Henrietta HooverLinda C Enisa Runyan FNP  10/26/2016, 11:59 AM

## 2016-10-28 ENCOUNTER — Telehealth: Payer: Self-pay

## 2016-10-28 NOTE — Telephone Encounter (Signed)
-----   Message from Henrietta HooverLinda C Bernhardt, NP sent at 10/27/2016  2:28 PM EST ----- Cmet ok. CBC HbG 8.5 up from 7.1, continue iron supplement.

## 2016-10-28 NOTE — Telephone Encounter (Signed)
Tried to call, both numbers have a busy signal. Will try later. Thanks!

## 2017-01-25 ENCOUNTER — Other Ambulatory Visit: Payer: Self-pay | Admitting: Family Medicine

## 2017-01-25 DIAGNOSIS — I10 Essential (primary) hypertension: Secondary | ICD-10-CM

## 2017-02-15 ENCOUNTER — Telehealth (HOSPITAL_COMMUNITY): Payer: Self-pay | Admitting: *Deleted

## 2017-02-15 NOTE — Telephone Encounter (Signed)
Refer to telephone call

## 2017-03-18 ENCOUNTER — Other Ambulatory Visit: Payer: Self-pay | Admitting: Family Medicine

## 2017-03-18 DIAGNOSIS — I10 Essential (primary) hypertension: Secondary | ICD-10-CM

## 2017-04-26 ENCOUNTER — Encounter: Payer: Self-pay | Admitting: Family Medicine

## 2017-04-26 ENCOUNTER — Ambulatory Visit (INDEPENDENT_AMBULATORY_CARE_PROVIDER_SITE_OTHER): Payer: Self-pay | Admitting: Family Medicine

## 2017-04-26 ENCOUNTER — Other Ambulatory Visit: Payer: Self-pay | Admitting: Family Medicine

## 2017-04-26 VITALS — BP 138/78 | HR 81 | Temp 98.6°F | Resp 16 | Ht 70.0 in | Wt 384.0 lb

## 2017-04-26 DIAGNOSIS — E669 Obesity, unspecified: Secondary | ICD-10-CM

## 2017-04-26 DIAGNOSIS — E611 Iron deficiency: Secondary | ICD-10-CM

## 2017-04-26 DIAGNOSIS — Z6841 Body Mass Index (BMI) 40.0 and over, adult: Secondary | ICD-10-CM

## 2017-04-26 DIAGNOSIS — I10 Essential (primary) hypertension: Secondary | ICD-10-CM

## 2017-04-26 DIAGNOSIS — IMO0001 Reserved for inherently not codable concepts without codable children: Secondary | ICD-10-CM

## 2017-04-26 DIAGNOSIS — E119 Type 2 diabetes mellitus without complications: Secondary | ICD-10-CM

## 2017-04-26 LAB — CBC WITH DIFFERENTIAL/PLATELET
BASOS PCT: 0 %
Basophils Absolute: 0 cells/uL (ref 0–200)
EOS ABS: 76 {cells}/uL (ref 15–500)
Eosinophils Relative: 1 %
HEMATOCRIT: 34.2 % — AB (ref 35.0–45.0)
Hemoglobin: 10 g/dL — ABNORMAL LOW (ref 11.7–15.5)
LYMPHS ABS: 3572 {cells}/uL (ref 850–3900)
LYMPHS PCT: 47 %
MCH: 17.3 pg — ABNORMAL LOW (ref 27.0–33.0)
MCHC: 29.2 g/dL — ABNORMAL LOW (ref 32.0–36.0)
MCV: 59.1 fL — AB (ref 80.0–100.0)
MONO ABS: 608 {cells}/uL (ref 200–950)
Monocytes Relative: 8 %
Neutro Abs: 3344 cells/uL (ref 1500–7800)
Neutrophils Relative %: 44 %
Platelets: 440 10*3/uL — ABNORMAL HIGH (ref 140–400)
RBC: 5.79 MIL/uL — AB (ref 3.80–5.10)
RDW: 21.5 % — AB (ref 11.0–15.0)
WBC: 7.6 10*3/uL (ref 3.8–10.8)

## 2017-04-26 LAB — POCT URINALYSIS DIP (DEVICE)
BILIRUBIN URINE: NEGATIVE
GLUCOSE, UA: NEGATIVE mg/dL
HGB URINE DIPSTICK: NEGATIVE
KETONES UR: NEGATIVE mg/dL
LEUKOCYTES UA: NEGATIVE
Nitrite: NEGATIVE
Protein, ur: NEGATIVE mg/dL
SPECIFIC GRAVITY, URINE: 1.025 (ref 1.005–1.030)
Urobilinogen, UA: 4 mg/dL — ABNORMAL HIGH (ref 0.0–1.0)
pH: 6.5 (ref 5.0–8.0)

## 2017-04-26 LAB — COMPLETE METABOLIC PANEL WITH GFR
ALT: 7 U/L (ref 6–29)
AST: 8 U/L — AB (ref 10–30)
Albumin: 3.8 g/dL (ref 3.6–5.1)
Alkaline Phosphatase: 63 U/L (ref 33–115)
BILIRUBIN TOTAL: 0.3 mg/dL (ref 0.2–1.2)
BUN: 8 mg/dL (ref 7–25)
CALCIUM: 9.4 mg/dL (ref 8.6–10.2)
CHLORIDE: 104 mmol/L (ref 98–110)
CO2: 21 mmol/L (ref 20–31)
CREATININE: 0.7 mg/dL (ref 0.50–1.10)
GFR, Est Non African American: >89 mL/min (ref >=60)
Glucose, Bld: 101 mg/dL — ABNORMAL HIGH (ref 65–99)
Potassium: 4.4 mmol/L (ref 3.5–5.3)
Sodium: 136 mmol/L (ref 135–146)
TOTAL PROTEIN: 7.5 g/dL (ref 6.1–8.1)

## 2017-04-26 LAB — POCT GLYCOSYLATED HEMOGLOBIN (HGB A1C): Hemoglobin A1C: 6.6

## 2017-04-26 MED ORDER — TRIAMTERENE-HCTZ 37.5-25 MG PO CAPS: 1.0000 | ORAL_CAPSULE | Freq: Every day | ORAL | 1 refills | Status: DC

## 2017-04-26 NOTE — Progress Notes (Signed)
Patient ID: Grace OuLashawn Y Hollenback, female    DOB: 11/20/1990, 26 y.o.   MRN: [id number]  PCP: Bing NeighborsHarris, Ashawna Hanback S, FNP  Chief Complaint  Patient presents with  . Follow-up    6 month on     Subjective:  HPI Grace Daniels is a 26 y.o. female presents for evaluation of hypertension and diabetes.  Hypertension  Grace OuLashawn Y Amburn reports no home monitoring of blood pressure, although occasionally will check blood pressure at work and obtains readings <140/90. Reports adherence to blood pressure medications. Reports efforts to adhere to low sodium diet. Had previously lost 17 lbs and has since gained back. Reports no routine exercise. She is a nonsmoker. Denies any episodes of dizziness, headaches, shortness of breath, or chest pain.  Diabetes Previously prescribed Metformin for hemoglobin A1C of 7.1, 03/14/2016.  Rechecked in 07/2016, and had decreased to 5.6. She is not taking Metformin at present. Denies any associated intolerances to temperature, polydipsia, polyphagia, or polyuria.  Body mass index is 55.1 kg/m.   Social History   Social History  . Marital status: Single    Spouse name: N/A  . Number of children: N/A  . Years of education: N/A   Occupational History  . Not on file.   Social History Main Topics  . Smoking status: Never Smoker  . Smokeless tobacco: Never Used  . Alcohol use No  . Drug use: No  . Sexual activity: No   Other Topics Concern  . Not on file   Social History Narrative  . No narrative on file    History reviewed. No pertinent family history. Review of Systems See HPI  Patient Active Problem List   Diagnosis Date Noted  . Abnormal uterine bleeding (AUB) 03/30/2016    No Known Allergies  Prior to Admission medications   Medication Sig Start Date End Date Taking? Authorizing Provider  ferrous sulfate 325 (65 FE) MG tablet Take 1 tablet (325 mg total) by mouth 2 (two) times daily with a meal. Patient not taking: Reported on 10/26/2016  07/30/16   Henrietta HooverBernhardt, Linda C, NP  medroxyPROGESTERone (PROVERA) 10 MG tablet One tab by mouth daily. Can increase to one tab twice a day, and then to 2 tabs twice a day, as needed for bleeding Patient not taking: Reported on 10/26/2016 03/14/16   Wouk, Wilfred CurtisNoah Bedford, MD  megestrol (MEGACE) 40 MG tablet Take 1 tablet (40 mg total) by mouth 2 (two) times daily. Patient not taking: Reported on 10/26/2016 04/08/16   Constant, Peggy, MD  metFORMIN (GLUCOPHAGE) 500 MG tablet Take 1 tablet (500 mg total) by mouth 2 (two) times daily with a meal. Patient not taking: Reported on 10/26/2016 07/27/16   Henrietta HooverBernhardt, Linda C, NP  triamterene-hydrochlorothiazide (DYAZIDE) 37.5-25 MG capsule TAKE ONE CAPSULE BY MOUTH EVERY DAY 03/18/17   Massie MaroonHollis, Lachina M, FNP    Past Medical, Surgical Family and Social History reviewed and updated.    Objective:   Today's Vitals   04/26/17 1051  BP: 138/78  Pulse: 81  Resp: 16  Temp: 98.6 F (37 C)  TempSrc: Oral  SpO2: 98%  Weight: (!) 384 lb (174.2 kg)  Height: 5\' 10"  (1.778 m)    Wt Readings from Last 3 Encounters:  04/26/17 (!) 384 lb (174.2 kg)  10/26/16 (!) 367 lb (166.5 kg)  09/21/16 (!) 376 lb (170.6 kg)   Physical Exam  Constitutional: She is oriented to person, place, and time. She appears well-developed and well-nourished.  HENT:  Head: Normocephalic  and atraumatic.  Eyes: Conjunctivae and EOM are normal. Pupils are equal, round, and reactive to light.  Neck: Normal range of motion. Neck supple. No thyromegaly present.  Cardiovascular: Normal rate, regular rhythm, normal heart sounds and intact distal pulses.   Pulmonary/Chest: Effort normal and breath sounds normal.  Neurological: She is alert and oriented to person, place, and time.  Skin: Skin is warm and dry.  Psychiatric: She has a normal mood and affect. Her behavior is normal. Thought content normal.   Assessment & Plan:  1. Essential hypertension - triamterene-hydrochlorothiazide (DYAZIDE)  37.5-25 MG capsule; Take 1 each (1 capsule total) by mouth daily.   - COMPLETE METABOLIC PANEL WITH GFR  2.Type 2 diabetes mellitus without complication, without long-term current use of insulin (HCC) - CBC with Differential - COMPLETE METABOLIC PANEL WITH GFR - Thyroid Panel With TSH - Increase physical activity to include vigorous activity for a minimal 150 minutes weekly.   3. Class 3 obesity with body mass index (BMI) of 50.0 to 59.9 in adult, unspecified obesity type, unspecified whether serious comorbidity present (HCC) -Increase physical activity to include vigorous activity for a minimal 150 minutes weekly.    We will repeat A1C in 3 months. If no change or increase in A1C, we will resume Metformin.   Godfrey Pick. Tiburcio Pea, MSN, FNP-C [facility]  [address] [place], [place] [address] [phone]

## 2017-04-26 NOTE — Addendum Note (Signed)
Addended by: Bing NeighborsHARRIS, Kai Calico S on: 04/26/2017 03:53 PM   Modules accepted: Orders

## 2017-04-27 LAB — THYROID PANEL WITH TSH
Free Thyroxine Index: 2.7 (ref 1.4–3.8)
T3 Uptake: 28 % (ref 22–35)
T4, Total: 9.7 ug/dL (ref 4.5–12.0)
TSH: 1.88 mIU/L

## 2017-04-28 LAB — IRON AND TIBC
%SAT: 6 % — ABNORMAL LOW (ref 11–50)
Iron: 26 ug/dL — ABNORMAL LOW (ref 40–190)
TIBC: 463 ug/dL — ABNORMAL HIGH (ref 250–450)
UIBC: 437 ug/dL

## 2017-04-28 LAB — FERRITIN

## 2017-04-30 MED ORDER — FERROUS SULFATE 325 (65 FE) MG PO TABS: 325.0000 mg | ORAL_TABLET | Freq: Two times a day (BID) | ORAL | 2 refills | Status: DC

## 2017-04-30 NOTE — Addendum Note (Signed)
Addended by: Bing NeighborsHARRIS, Amarilis Belflower S on: 04/30/2017 06:53 AM   Modules accepted: Orders

## 2017-05-20 ENCOUNTER — Telehealth: Payer: Self-pay

## 2017-05-20 NOTE — Telephone Encounter (Signed)
Patient was notified of result and will pick up iron supplement

## 2017-07-26 ENCOUNTER — Encounter: Payer: Self-pay | Admitting: Family Medicine

## 2017-07-26 ENCOUNTER — Telehealth: Payer: Self-pay | Admitting: Internal Medicine

## 2017-07-26 ENCOUNTER — Ambulatory Visit (INDEPENDENT_AMBULATORY_CARE_PROVIDER_SITE_OTHER): Payer: BLUE CROSS/BLUE SHIELD | Admitting: Family Medicine

## 2017-07-26 VITALS — BP 140/85 | HR 90 | Temp 98.7°F | Resp 14 | Ht 70.0 in | Wt 390.0 lb

## 2017-07-26 DIAGNOSIS — I10 Essential (primary) hypertension: Secondary | ICD-10-CM | POA: Diagnosis not present

## 2017-07-26 DIAGNOSIS — E611 Iron deficiency: Secondary | ICD-10-CM

## 2017-07-26 DIAGNOSIS — N926 Irregular menstruation, unspecified: Secondary | ICD-10-CM

## 2017-07-26 DIAGNOSIS — E1169 Type 2 diabetes mellitus with other specified complication: Secondary | ICD-10-CM | POA: Diagnosis not present

## 2017-07-26 DIAGNOSIS — E669 Obesity, unspecified: Secondary | ICD-10-CM | POA: Diagnosis not present

## 2017-07-26 LAB — POCT URINALYSIS DIP (DEVICE)
Bilirubin Urine: NEGATIVE
GLUCOSE, UA: NEGATIVE mg/dL
Hgb urine dipstick: NEGATIVE
Ketones, ur: NEGATIVE mg/dL
Leukocytes, UA: NEGATIVE
NITRITE: NEGATIVE
PROTEIN: NEGATIVE mg/dL
Specific Gravity, Urine: 1.02 (ref 1.005–1.030)
UROBILINOGEN UA: 0.2 mg/dL (ref 0.0–1.0)
pH: 7 (ref 5.0–8.0)

## 2017-07-26 LAB — IRON,TIBC AND FERRITIN PANEL
%SAT: 6 % — AB (ref 11–50)
FERRITIN: 20 ng/mL (ref 10–154)
Iron: 24 ug/dL — ABNORMAL LOW (ref 40–190)
TIBC: 417 mcg/dL (calc) (ref 250–450)

## 2017-07-26 LAB — POCT GLYCOSYLATED HEMOGLOBIN (HGB A1C): HEMOGLOBIN A1C: 7.1

## 2017-07-26 MED ORDER — METFORMIN HCL 500 MG PO TABS: 500.0000 mg | ORAL_TABLET | Freq: Two times a day (BID) | ORAL | 6 refills | Status: DC

## 2017-07-26 MED ORDER — TRIAMTERENE-HCTZ 37.5-25 MG PO CAPS: 1.0000 | ORAL_CAPSULE | Freq: Every day | ORAL | 1 refills | Status: DC

## 2017-07-26 NOTE — Telephone Encounter (Signed)
Spoke with the pt and scheduled the appt  

## 2017-07-26 NOTE — Telephone Encounter (Signed)
We can use any RNA slot. Thanks.

## 2017-07-26 NOTE — Telephone Encounter (Signed)
CY please advise if pt can be worked in sooner.  Thanks!

## 2017-07-26 NOTE — Patient Instructions (Addendum)
You are being prescribed metformin 500 mg twice daily with meals for Type 2 Diabetes.  Triamterene-Hydrochlorothiazide 37.5-25 mg once daily for blood pressure.  Increase physical activity. Reduce intake of foods high in sugar and starches.    Diabetes Mellitus and Food It is important for you to manage your blood sugar (glucose) level. Your blood glucose level can be greatly affected by what you eat. Eating healthier foods in the appropriate amounts throughout the day at about the same time each day will help you control your blood glucose level. It can also help slow or prevent worsening of your diabetes mellitus. Healthy eating may even help you improve the level of your blood pressure and reach or maintain a healthy weight. General recommendations for healthful eating and cooking habits include:  Eating meals and snacks regularly. Avoid going long periods of time without eating to lose weight.  Eating a diet that consists mainly of plant-based foods, such as fruits, vegetables, nuts, legumes, and whole grains.  Using low-heat cooking methods, such as baking, instead of high-heat cooking methods, such as deep frying.  Work with your dietitian to make sure you understand how to use the Nutrition Facts information on food labels. How can food affect me? Carbohydrates Carbohydrates affect your blood glucose level more than any other type of food. Your dietitian will help you determine how many carbohydrates to eat at each meal and teach you how to count carbohydrates. Counting carbohydrates is important to keep your blood glucose at a healthy level, especially if you are using insulin or taking certain medicines for diabetes mellitus. Alcohol Alcohol can cause sudden decreases in blood glucose (hypoglycemia), especially if you use insulin or take certain medicines for diabetes mellitus. Hypoglycemia can be a life-threatening condition. Symptoms of hypoglycemia (sleepiness, dizziness, and  disorientation) are similar to symptoms of having too much alcohol. If your health care provider has given you approval to drink alcohol, do so in moderation and use the following guidelines:  Women should not have more than one drink per day, and men should not have more than two drinks per day. One drink is equal to: ? 12 oz of beer. ? 5 oz of wine. ? 1 oz of hard liquor.  Do not drink on an empty stomach.  Keep yourself hydrated. Have water, diet soda, or unsweetened iced tea.  Regular soda, juice, and other mixers might contain a lot of carbohydrates and should be counted.  What foods are not recommended? As you make food choices, it is important to remember that all foods are not the same. Some foods have fewer nutrients per serving than other foods, even though they might have the same number of calories or carbohydrates. It is difficult to get your body what it needs when you eat foods with fewer nutrients. Examples of foods that you should avoid that are high in calories and carbohydrates but low in nutrients include:  Trans fats (most processed foods list trans fats on the Nutrition Facts label).  Regular soda.  Juice.  Candy.  Sweets, such as cake, pie, doughnuts, and cookies.  Fried foods.  What foods can I eat? Eat nutrient-rich foods, which will nourish your body and keep you healthy. The food you should eat also will depend on several factors, including:  The calories you need.  The medicines you take.  Your weight.  Your blood glucose level.  Your blood pressure level.  Your cholesterol level.  You should eat a variety of foods, including:  Protein. ? Lean cuts of meat. ? Proteins low in saturated fats, such as fish, egg whites, and beans. Avoid processed meats.  Fruits and vegetables. ? Fruits and vegetables that may help control blood glucose levels, such as apples, mangoes, and yams.  Dairy products. ? Choose fat-free or low-fat dairy products,  such as milk, yogurt, and cheese.  Grains, bread, pasta, and rice. ? Choose whole grain products, such as multigrain bread, whole oats, and brown rice. These foods may help control blood pressure.  Fats. ? Foods containing healthful fats, such as nuts, avocado, olive oil, canola oil, and fish.  Does everyone with diabetes mellitus have the same meal plan? Because every person with diabetes mellitus is different, there is not one meal plan that works for everyone. It is very important that you meet with a dietitian who will help you create a meal plan that is just right for you. This information is not intended to replace advice given to you by your health care provider. Make sure you discuss any questions you have with your health care provider. Document Released: 07/30/2005 Document Revised: 04/09/2016 Document Reviewed: 09/29/2013 Elsevier Interactive Patient Education  2017 [employer]

## 2017-07-26 NOTE — Telephone Encounter (Signed)
Pt returned call

## 2017-07-26 NOTE — Telephone Encounter (Signed)
LMTCB

## 2017-07-26 NOTE — Telephone Encounter (Signed)
Katie- please see what we can offer

## 2017-07-26 NOTE — Progress Notes (Signed)
Patient ID: Grace Daniels, female    DOB: 06/21/1991, 26 y.o.   MRN: [id number]  PCP: Grace Daniels, Grace Eckley S, FNP  Chief Complaint  Patient presents with  . Follow-up    3 MONTH    Subjective:  HPI Grace Daniels , 26 year old with a history of PCOS, Type 2 diabetes, iron deficiency anemia,  and hypertension, presents to today for a chronic conditions follow-up. During [name]'Daniels last visit, she desired to attempt lifestyle changes oppose to taking medication to manage diabetes. [name] has not achieved any measurable weight loss since her previous office visit nor has she adopted a routine physical exercise regimen. Current Body mass index is 55.96 kg/m. Last A1C 6.6, 3 months prior. She denies associated polydipsia, polyphagia, or polyuria. [name] has a history if PCOS and irregular menstrual bleeding and requests a referral to gynecology. [facility], although did not continue follow-up with practice. She reports that she is no longer taking iron supplements. She took daily up until a few weeks prior. Social History   Social History  . Marital status: Single    Spouse name: N/A  . Number of children: N/A  . Years of education: N/A   Occupational History  . Not on file.   Social History Main Topics  . Smoking status: Never Smoker  . Smokeless tobacco: Never Used  . Alcohol use No  . Drug use: No  . Sexual activity: No   Other Topics Concern  . Not on file   Social History Narrative  . No narrative on file    Family History  Problem Relation Age of Onset  . Family history unknown: Yes   Review of Systems See HPI  Patient Active Problem List   Diagnosis Date Noted  . HTN (hypertension) 04/26/2017  . Abnormal uterine bleeding (AUB) 03/30/2016    No Known Allergies  Prior to Admission medications   Medication Sig Start Date End Date Taking? Authorizing Provider  ferrous sulfate 325 (65 FE) MG tablet Take 1 tablet  (325 mg total) by mouth 2 (two) times daily with a meal. 04/30/17  Yes Grace Daniels, Eldwin Volkov S, FNP  triamterene-hydrochlorothiazide (DYAZIDE) 37.5-25 MG capsule Take 1 each (1 capsule total) by mouth daily. 04/26/17  Yes Grace Daniels, Rovena Hearld S, FNP  medroxyPROGESTERone (PROVERA) 10 MG tablet One tab by mouth daily. Can increase to one tab twice a day, and then to 2 tabs twice a day, as needed for bleeding Patient not taking: Reported on 10/26/2016 03/14/16   Wouk, Wilfred CurtisNoah Bedford, MD  megestrol (MEGACE) 40 MG tablet Take 1 tablet (40 mg total) by mouth 2 (two) times daily. Patient not taking: Reported on 10/26/2016 04/08/16   Constant, Peggy, MD  metFORMIN (GLUCOPHAGE) 500 MG tablet Take 1 tablet (500 mg total) by mouth 2 (two) times daily with a meal. Patient not taking: Reported on 10/26/2016 07/27/16   Henrietta HooverBernhardt, Linda C, NP    Past Medical, Surgical Family and Social History reviewed and updated.    Objective:   Today'Daniels Vitals   07/26/17 1334  BP: 140/85  Pulse: 90  Resp: 14  Temp: 98.7 F (37.1 C)  TempSrc: Oral  SpO2: 97%  Weight: (!) 390 lb (176.9 kg)  Height: 5\' 10"  (1.778 m)    Wt Readings from Last 3 Encounters:  07/26/17 (!) 390 lb (176.9 kg)  04/26/17 (!) 384 lb (174.2 kg)  10/26/16 (!) 367 lb (166.5 kg)  Physical Exam  Constitutional: She appears well-developed and well-nourished.  HENT:  Head: Normocephalic and atraumatic.  Eyes: Pupils are equal, round, and reactive to light. Conjunctivae and EOM are normal.  Neck: Normal range of motion. Neck supple.  Cardiovascular: Normal rate, normal heart sounds and intact distal pulses.   Pulmonary/Chest: Effort normal and breath sounds normal.  Abdominal: Soft. Bowel sounds are normal.  Musculoskeletal: She exhibits no edema.  Neurological: She is alert.  Skin: Skin is warm.  Psychiatric: She has a normal mood and affect. Her behavior is normal. Judgment and thought content normal.   Assessment & Plan:  1. Diabetes mellitus  type 2 in obese (HCC) - POCT glycosylated hemoglobin (Hb A1C), 7.1. Start Metformin 500 mg twice daily with meals. Increase physical activity as tolerated working up to a recommended 150 minutes weekly. This will facilitate weight loss and improve blood pressure. If no improvement will consider a referral to diabetes education.  2. Hypertension, Essential, not at goal today (140/80), however patient is out of medication. -Continue Dyazide, 1 capsule daily for blood pressure management. If blood pressure remains not at goal will consider adding ACE/ARB   3. Iron Deficiency Anemia. recheck total iron panel today   4. Irregular menses -Ambulatory referral to gynecology   RTC: 3 months repeat A1C and iron level  Godfrey Pick. Tiburcio Pea, MSN, FNP-C [facility]  [address] [place], [place] [address] [phone]   .

## 2017-08-02 ENCOUNTER — Encounter: Payer: Self-pay | Admitting: Internal Medicine

## 2017-08-02 ENCOUNTER — Ambulatory Visit (INDEPENDENT_AMBULATORY_CARE_PROVIDER_SITE_OTHER): Payer: BLUE CROSS/BLUE SHIELD | Admitting: Internal Medicine

## 2017-08-02 VITALS — BP 126/80 | HR 91 | Ht 70.0 in | Wt 387.0 lb

## 2017-08-02 DIAGNOSIS — G4733 Obstructive sleep apnea (adult) (pediatric): Secondary | ICD-10-CM

## 2017-08-02 NOTE — Assessment & Plan Note (Signed)
Original sleep study is not available. She has used CPAP consistently but needs supplies. She understands the basics. She wanted to discuss alternatives to CPAP and expresses some interest in surgery. Plan-schedule sleep study to update diagnosis. She will call for results. I anticipate offering CPAP but can refer her if she still wishes.

## 2017-08-02 NOTE — Assessment & Plan Note (Signed)
We discussed the importance of significant weight loss to her long-term health and to control of her sleep apnea and blood pressure. Plan-she is given referral phone number to contact the bariatric program

## 2017-08-02 NOTE — Progress Notes (Signed)
08/02/17- 26 year old female never smoker for sleep evaluation. Self referral-Had sleep study through [facility] about 10 + years ago. Has CPAP but has not used it due to supplies,etc. Would like to go back on CPAP for sleep issues and newly dx of DM 2 [facility]- 10/24 She doesn't feel she rests well at night, somewhat uncomfortable. Morning headaches. Told of witnessed apneas and loud snoring. Very short sleep latency. CPAP was helpful in the past before she ran out of supplies, but she would be interested in alternatives including surgical evaluation. Denies heart or lung disease. No ENT surgery  Past Medical History:  Diagnosis Date  . Hyperlipidemia   . Hypertension   . Morbid obesity (HCC)   . Sleep apnea    No past surgical history on file. Family History  Problem Relation Age of Onset  . Family history unknown: Yes   Social History   Social History  . Marital status: Single    Spouse name: N/A  . Number of children: N/A  . Years of education: N/A   Occupational History  . Not on file.   Social History Main Topics  . Smoking status: Never Smoker  . Smokeless tobacco: Never Used  . Alcohol use No  . Drug use: No  . Sexual activity: No   Other Topics Concern  . Not on file   Social History Narrative  . No narrative on file   ROS-see HPI   + = pos Constitutional:    weight loss, night sweats, fevers, chills, fatigue, lassitude. HEENT:   + headaches, difficulty swallowing, tooth/dental problems, sore throat,       sneezing, itching, ear ache, nasal congestion, post nasal drip, snoring CV:    chest pain, orthopnea, PND, swelling in lower extremities, anasarca,                                                 dizziness, palpitations Resp:   shortness of breath with exertion or at rest.                productive cough,   non-productive cough, coughing up of blood.              change in color of mucus.  wheezing.   Skin:    rash or lesions. GI:  No-   heartburn, indigestion,  abdominal pain, nausea, vomiting, diarrhea,                 change in bowel habits, loss of appetite GU: dysuria, change in color of urine, no urgency or frequency.   flank pain. MS:   joint pain, stiffness, decreased range of motion, back pain. Neuro-     nothing unusual Psych:  change in mood or affect.  depression or anxiety.   memory loss.  OBJ- Physical Exam General- Alert, Oriented, Affect-appropriate, Distress- none acute, + Morbidly obese Skin- rash-none, lesions- none, excoriation- none Lymphadenopathy- none Head- atraumatic            Eyes- Gross vision intact, PERRLA, conjunctivae and secretions clear            Ears- Hearing, canals-normal            Nose- Clear, no-Septal dev, mucus, polyps, erosion, perforation             Throat- Mallampati IV , mucosa clear , drainage-  none, tonsils- atrophic Neck- flexible , trachea midline, no stridor , thyroid nl, carotid no bruit Chest - symmetrical excursion , unlabored           Heart/CV- RRR , no murmur , no gallop  , no rub, nl s1 s2                           - JVD- none , edema- none, stasis changes- none, varices- none           Lung- clear to P&A, wheeze- none, cough- none , dullness-none, rub- none           Chest wall-  Abd-  Br/ Gen/ Rectal- Not done, not indicated Extrem- cyanosis- none, clubbing, none, atrophy- none, strength- nl Neuro- grossly intact to observation

## 2017-08-02 NOTE — Patient Instructions (Addendum)
Order- schedule unattended home sleep test    Dx OSA   Please call me about 2 weeks after your sleep test is done, to ask for results and recommendations. I anticipate that we will be able to order    Phone number for Bariatric program for weight loss help

## 2017-08-30 DIAGNOSIS — G4733 Obstructive sleep apnea (adult) (pediatric): Secondary | ICD-10-CM | POA: Diagnosis not present

## 2017-08-31 DIAGNOSIS — G4733 Obstructive sleep apnea (adult) (pediatric): Secondary | ICD-10-CM | POA: Diagnosis not present

## 2017-09-06 ENCOUNTER — Telehealth: Payer: Self-pay | Admitting: Internal Medicine

## 2017-09-06 NOTE — Telephone Encounter (Signed)
ATC pt, no answer. Left message for pt to call back.  

## 2017-09-07 NOTE — Telephone Encounter (Signed)
Spoke with patient. She stated that she returned the equipment for her HST last Monday. She is requesting her results. Advised her that as soon as her results are made available, someone from our office will call her. She verbalized understanding.

## 2017-09-13 ENCOUNTER — Telehealth: Payer: Self-pay | Admitting: Internal Medicine

## 2017-09-13 DIAGNOSIS — G4733 Obstructive sleep apnea (adult) (pediatric): Secondary | ICD-10-CM

## 2017-09-13 NOTE — Telephone Encounter (Signed)
Spoke with pt and advised that we have not received the results and it may be in the process of being scanned. Pt understood and reassured we would call her when we receive it. FYI CY

## 2017-09-14 ENCOUNTER — Other Ambulatory Visit: Payer: Self-pay | Admitting: *Deleted

## 2017-09-14 DIAGNOSIS — G4733 Obstructive sleep apnea (adult) (pediatric): Secondary | ICD-10-CM

## 2017-09-14 NOTE — Telephone Encounter (Signed)
Advised pt of results. Pt understood and nothing further is needed.   CPAP ordered.  

## 2017-09-14 NOTE — Telephone Encounter (Signed)
Home sleep test showed severe obstructive sleep apnea. She averaged 86 apneas/ hour, with drops in blood oxygen level I recommend we order DME, CPAP auto 5-20, mask of choice, humidifier, supplies, AirView  For dx OSA  Please make sure we have return appointment in 31-90 days for f/u [facility][per insurance regulations.

## 2017-09-28 ENCOUNTER — Telehealth: Payer: Self-pay | Admitting: Internal Medicine

## 2017-09-28 NOTE — Telephone Encounter (Signed)
Spoke with [facility] and they have received the order and will call pt by the end of the day. Advised pt of message and nothing further is needed.

## 2017-10-04 DIAGNOSIS — G4733 Obstructive sleep apnea (adult) (pediatric): Secondary | ICD-10-CM | POA: Diagnosis not present

## 2017-10-25 ENCOUNTER — Ambulatory Visit: Payer: BLUE CROSS/BLUE SHIELD | Admitting: Family Medicine

## 2017-11-01 ENCOUNTER — Telehealth: Payer: Self-pay | Admitting: Family Medicine

## 2017-11-01 ENCOUNTER — Encounter: Payer: Self-pay | Admitting: Family Medicine

## 2017-11-01 ENCOUNTER — Ambulatory Visit (INDEPENDENT_AMBULATORY_CARE_PROVIDER_SITE_OTHER): Payer: BLUE CROSS/BLUE SHIELD | Admitting: Family Medicine

## 2017-11-01 VITALS — BP 123/100 | HR 84 | Temp 98.7°F | Resp 14 | Ht 70.0 in | Wt 388.0 lb

## 2017-11-01 DIAGNOSIS — E1169 Type 2 diabetes mellitus with other specified complication: Secondary | ICD-10-CM | POA: Diagnosis not present

## 2017-11-01 DIAGNOSIS — I10 Essential (primary) hypertension: Secondary | ICD-10-CM | POA: Diagnosis not present

## 2017-11-01 DIAGNOSIS — E611 Iron deficiency: Secondary | ICD-10-CM

## 2017-11-01 DIAGNOSIS — E669 Obesity, unspecified: Secondary | ICD-10-CM | POA: Diagnosis not present

## 2017-11-01 LAB — POCT URINALYSIS DIP (DEVICE)
Bilirubin Urine: NEGATIVE
GLUCOSE, UA: NEGATIVE mg/dL
Hgb urine dipstick: NEGATIVE
Ketones, ur: NEGATIVE mg/dL
Leukocytes, UA: NEGATIVE
Nitrite: NEGATIVE
PROTEIN: NEGATIVE mg/dL
SPECIFIC GRAVITY, URINE: 1.025 (ref 1.005–1.030)
UROBILINOGEN UA: 0.2 mg/dL (ref 0.0–1.0)
pH: 6 (ref 5.0–8.0)

## 2017-11-01 LAB — POCT GLYCOSYLATED HEMOGLOBIN (HGB A1C): HEMOGLOBIN A1C: 6.6

## 2017-11-01 MED ORDER — METFORMIN HCL 500 MG PO TABS: 500.0000 mg | ORAL_TABLET | Freq: Every day | ORAL | 4 refills | Status: DC

## 2017-11-01 MED ORDER — TRIAMTERENE-HCTZ 37.5-25 MG PO CAPS: 1.0000 | ORAL_CAPSULE | Freq: Every day | ORAL | 2 refills | Status: DC

## 2017-11-01 NOTE — Telephone Encounter (Signed)
error 

## 2017-11-01 NOTE — Progress Notes (Signed)
Patient ID: Grace OuLashawn Y Traum, female    DOB: 01/20/1991, 26 y.o.   MRN: [id number]  PCP: Bing NeighborsHarris, Nevayah Faust S, FNP  Chief Complaint  Patient presents with  . Follow-up    3 month    Subjective:  HPI Grace Daniels is a 26 y.o. female presents for evaluation of hypertension,  type 2 diabetes, and iron deficiency anemia. Shir reports no home monitoring of blood pressure or blood sugar. She is adherent with current medication regimen although admits to occasionally missing a dose. She remains sedentary mostly. She is a non-smoker and doesn't routinely adhere to any dietary restrictions. Margaruite suffers from iron deficiency anemia. Last iron level 26, 07/26/2017. Reports inconsistently taking prescribed ferrous. She denies any recent episodes of dizziness, fatigue, chest pain, shortness of breath, or weakness.  Social History   Socioeconomic History  . Marital status: Single    Spouse name: Not on file  . Number of children: Not on file  . Years of education: Not on file  . Highest education level: Not on file  Social Needs  . Financial resource strain: Not on file  . Food insecurity - worry: Not on file  . Food insecurity - inability: Not on file  . Transportation needs - medical: Not on file  . Transportation needs - non-medical: Not on file  Occupational History  . Not on file  Tobacco Use  . Smoking status: Never Smoker  . Smokeless tobacco: Never Used  Substance and Sexual Activity  . Alcohol use: No  . Drug use: No  . Sexual activity: No  Other Topics Concern  . Not on file  Social History Narrative  . Not on file    Family History  Family history unknown: Patient reports know known family history of hypertension, heart disease, diabetes, or cancer   Review of Systems  Constitutional: Negative for fever, chills, diaphoresis, activity change, appetite change and fatigue. HENT: Negative for ear pain, nosebleeds, congestion, facial swelling, rhinorrhea, neck pain, neck  stiffness and ear discharge.  Eyes: Negative for pain, discharge, redness, itching and visual disturbance. Respiratory: Negative for cough, choking, chest tightness, shortness of breath, wheezing and stridor.  Cardiovascular: Negative for chest pain, palpitations and leg swelling. Gastrointestinal: Negative for abdominal distention. Genitourinary: Negative for dysuria, urgency, frequency, hematuria, flank pain, decreased urine volume, difficulty urinating and dyspareunia.  Musculoskeletal: Negative for back pain, joint swelling, arthralgia and gait problem. Neurological: Negative for dizziness, tremors, seizures, syncope, facial asymmetry, speech difficulty, weakness, light-headedness, numbness and headaches.  Hematological: Negative for adenopathy. Does not bruise/bleed easily. Psychiatric/Behavioral: Negative for hallucinations, behavioral problems, confusion, dysphoric mood, decreased concentration and agitation.   Patient Active Problem List   Diagnosis Date Noted  . Obstructive sleep apnea 08/02/2017  . Morbid obesity due to excess calories (HCC) 08/02/2017  . HTN (hypertension) 04/26/2017  . Abnormal uterine bleeding (AUB) 03/30/2016    No Known Allergies  Prior to Admission medications   Medication Sig Start Date End Date Taking? Authorizing Provider  ferrous sulfate 325 (65 FE) MG tablet Take 1 tablet (325 mg total) by mouth 2 (two) times daily with a meal. 04/30/17  Yes Bing NeighborsHarris, Cote Mayabb S, FNP  metFORMIN (GLUCOPHAGE) 500 MG tablet Take 1 tablet (500 mg total) by mouth 2 (two) times daily with a meal. 07/26/17  Yes Bing NeighborsHarris, Savannah Morford S, FNP  triamterene-hydrochlorothiazide (DYAZIDE) 37.5-25 MG capsule Take 1 each (1 capsule total) by mouth daily. 07/26/17  Yes Bing NeighborsHarris, Meranda Dechaine S, FNP    Past Medical, Surgical  Family and Social History reviewed and updated.    Objective:   Today's Vitals   11/01/17 1411  BP: (!) 123/100  Pulse: 84  Resp: 14  Temp: 98.7 F (37.1 C)  TempSrc:  Oral  SpO2: 100%  Weight: (!) 388 lb (176 kg)  Height: 5\' 10"  (1.778 m)    Wt Readings from Last 3 Encounters:  11/01/17 (!) 388 lb (176 kg)  08/02/17 (!) 387 lb (175.5 kg)  07/26/17 (!) 390 lb (176.9 kg)    Physical Exam Constitutional: Patient appears well-developed and well-nourished. No distress. HENT: Normocephalic, atraumatic, External right and left ear normal. Oropharynx is clear and moist.  Eyes: Conjunctivae and EOM are normal. PERRLA, no scleral icterus. Neck: Normal ROM. Neck supple. No JVD. No tracheal deviation. No thyromegaly. CVS: RRR, S1/S2 +, no murmurs, no gallops, no carotid bruit.  Pulmonary: Effort and breath sounds normal, no stridor, rhonchi, wheezes, rales.  Abdominal: Soft. BS +, no distension, tenderness, rebound or guarding.  Musculoskeletal: Normal range of motion. No edema and no tenderness.  Lymphadenopathy: No lymphadenopathy noted, cervical, inguinal or axillary Neuro: Alert. Normal reflexes, muscle tone coordination. No cranial nerve deficit. Skin: Skin is warm and dry. No rash noted. Not diaphoretic. No erythema. No pallor. Psychiatric: Normal mood and affect. Behavior, judgment, thought content normal  Assessment & Plan:  1. Diabetes mellitus type 2 in obese (HCC)-A1C 6.6 improved from prior visit. Decreased metformin 500 mg once daily. 2. Iron deficiency, repeating iron panel today. Last hemoglobin 10.0.  If level remains low, will schedule an iron infusion. Encouraged patient to consistently take prescribed ferrous daily. 3. Essential hypertension, stable, diastolic reading remains elevated. Continue triamterene-hydrochlorothiazide (DYAZIDE) 37.5-25 MG capsule once daily. We have discussed target BP range and blood pressure goal. I have advised patient to check BP regularly and to call us back or report to clinic if the numbers are consistently higher than 130/90. We discussed the importance of compliance with medical therapy and DASH diet  recommended, consequences of uncontrolled hypertension discussed.   Meds ordered this encounter  Medications  . metFORMIN (GLUCOPHAGE) 500 MG tablet    Sig: Take 1 tablet (500 mg total) by mouth daily with breakfast.    Dispense:  60 tablet    Refill:  4    Order Specific Question:   Supervising Provider    Answer:   Quentin AngstJEGEDE, OLUGBEMIGA E [id number]]  . triamterene-hydrochlorothiazide (DYAZIDE) 37.5-25 MG capsule    Sig: Take 1 each (1 capsule total) by mouth daily.    Dispense:  90 capsule    Refill:  2    Order Specific Question:   Supervising Provider    Answer:   Quentin AngstJEGEDE, OLUGBEMIGA E [id number]]    Orders Placed This Encounter  Procedures  . Iron, TIBC and Ferritin Panel  . POCT glycosylated hemoglobin (Hb A1C)  . POCT urinalysis dip (device)     RTC: 6 months for chronic conditions follow-up, obtain CBC, A1C, fasting lipid panel   Godfrey PickKimberly S. Tiburcio PeaHarris, MSN, FNP-C [facility]  [address] [place], [place] [address] [phone]

## 2017-11-02 LAB — IRON,TIBC AND FERRITIN PANEL
FERRITIN: 23 ng/mL (ref 15–150)
IRON: 22 ug/dL — AB (ref 27–159)
Iron Saturation: 6 % — CL (ref 15–55)
TIBC: 387 ug/dL (ref 250–450)
UIBC: 365 ug/dL (ref 131–425)

## 2017-11-03 ENCOUNTER — Telehealth: Payer: Self-pay | Admitting: Family Medicine

## 2017-11-03 DIAGNOSIS — G4733 Obstructive sleep apnea (adult) (pediatric): Secondary | ICD-10-CM | POA: Diagnosis not present

## 2017-11-03 NOTE — Telephone Encounter (Signed)
Please advise patient that her iron level is low and per conversation during her last visit I do recommend that she is intravenously infused to replace her iron.  Please schedule a visit for iron infusion at the day hospital at patient's convenience.  This is not emergent however should take place within the next 7-10 days.  Godfrey PickKimberly S. Tiburcio PeaHarris, MSN, FNP-C [facility]  [address] [place], [place] [address] [phone]

## 2017-11-04 NOTE — Telephone Encounter (Signed)
Patient notified and will come in 11/22/2017 for infusion.

## 2017-11-07 ENCOUNTER — Encounter: Payer: Self-pay | Admitting: Family Medicine

## 2017-11-15 ENCOUNTER — Ambulatory Visit: Payer: BLUE CROSS/BLUE SHIELD | Admitting: Internal Medicine

## 2017-11-19 ENCOUNTER — Institutional Professional Consult (permissible substitution): Payer: BLUE CROSS/BLUE SHIELD | Admitting: Internal Medicine

## 2017-11-22 ENCOUNTER — Encounter (HOSPITAL_COMMUNITY): Payer: BLUE CROSS/BLUE SHIELD

## 2017-12-04 DIAGNOSIS — G4733 Obstructive sleep apnea (adult) (pediatric): Secondary | ICD-10-CM | POA: Diagnosis not present

## 2017-12-24 ENCOUNTER — Encounter (HOSPITAL_BASED_OUTPATIENT_CLINIC_OR_DEPARTMENT_OTHER): Payer: Self-pay | Admitting: *Deleted

## 2017-12-24 ENCOUNTER — Other Ambulatory Visit: Payer: Self-pay

## 2017-12-24 DIAGNOSIS — R05 Cough: Secondary | ICD-10-CM | POA: Diagnosis not present

## 2017-12-24 DIAGNOSIS — Z7984 Long term (current) use of oral hypoglycemic drugs: Secondary | ICD-10-CM | POA: Insufficient documentation

## 2017-12-24 DIAGNOSIS — J069 Acute upper respiratory infection, unspecified: Secondary | ICD-10-CM | POA: Insufficient documentation

## 2017-12-24 DIAGNOSIS — I10 Essential (primary) hypertension: Secondary | ICD-10-CM | POA: Diagnosis not present

## 2017-12-24 DIAGNOSIS — R0981 Nasal congestion: Secondary | ICD-10-CM | POA: Diagnosis not present

## 2017-12-24 DIAGNOSIS — R509 Fever, unspecified: Secondary | ICD-10-CM | POA: Insufficient documentation

## 2017-12-24 DIAGNOSIS — R067 Sneezing: Secondary | ICD-10-CM | POA: Insufficient documentation

## 2017-12-24 NOTE — ED Triage Notes (Signed)
Cough x 1 week

## 2017-12-25 ENCOUNTER — Emergency Department (HOSPITAL_BASED_OUTPATIENT_CLINIC_OR_DEPARTMENT_OTHER): Payer: BLUE CROSS/BLUE SHIELD

## 2017-12-25 ENCOUNTER — Emergency Department (HOSPITAL_BASED_OUTPATIENT_CLINIC_OR_DEPARTMENT_OTHER)
Admission: EM | Admit: 2017-12-25 | Discharge: 2017-12-25 | Disposition: A | Discharge status: Home / Self Care | Payer: BLUE CROSS/BLUE SHIELD | Attending: Emergency Medicine | Admitting: Emergency Medicine

## 2017-12-25 DIAGNOSIS — J069 Acute upper respiratory infection, unspecified: Secondary | ICD-10-CM

## 2017-12-25 DIAGNOSIS — R05 Cough: Secondary | ICD-10-CM | POA: Diagnosis not present

## 2017-12-25 DIAGNOSIS — R059 Cough, unspecified: Secondary | ICD-10-CM

## 2017-12-25 MED ORDER — CETIRIZINE HCL 10 MG PO TABS: 10.0000 mg | ORAL_TABLET | Freq: Every day | ORAL | 0 refills | Status: DC

## 2017-12-25 MED ORDER — FLUTICASONE PROPIONATE 50 MCG/ACT NA SUSP: 1.0000 | Freq: Every day | NASAL | 0 refills | Status: DC

## 2017-12-25 MED ORDER — PROMETHAZINE-DM 6.25-15 MG/5ML PO SYRP: 5.0000 mL | ORAL_SOLUTION | Freq: Four times a day (QID) | ORAL | 0 refills | Status: DC | PRN

## 2017-12-25 NOTE — Discharge Instructions (Signed)
As discussed, drink plenty of fluids and take cough medications as needed. Cough drops, tea with honey can help soothe your throat and calm your cough. Nasal spray and antihistamine for congestion and sneezing.  Follow up with your Primary care provider on Monday. Return if symptoms worsen in the meantime.

## 2017-12-25 NOTE — ED Provider Notes (Signed)
[facility] EMERGENCY DEPARTMENT Provider Note   CSN: [id number] Arrival date & time: 12/24/17  2201     History   Chief Complaint Chief Complaint  Patient presents with  . Cough    HPI Grace Daniels is a 27 y.o. female with past medical history significant for hyperlipidemia, hypertension, morbid obesity, presenting with 1 week of dry cough which has now turned productive, congestion, sneezing, subjective fevers.  Patient has not tried anything for her symptoms.  She does report ill contacts at work.  Denies nausea, vomiting, diarrhea, abdominal pain, myalgias.  HPI  Past Medical History:  Diagnosis Date  . Hyperlipidemia   . Hypertension   . Morbid obesity (HCC)   . Sleep apnea     Patient Active Problem List   Diagnosis Date Noted  . Obstructive sleep apnea 08/02/2017  . Morbid obesity due to excess calories (HCC) 08/02/2017  . HTN (hypertension) 04/26/2017  . Abnormal uterine bleeding (AUB) 03/30/2016    History reviewed. No pertinent surgical history.  OB History    Gravida Para Term Preterm AB Living   0         0   SAB TAB Ectopic Multiple Live Births                   Home Medications    Prior to Admission medications   Medication Sig Start Date End Date Taking? Authorizing Provider  ferrous sulfate 325 (65 FE) MG tablet Take 1 tablet (325 mg total) by mouth 2 (two) times daily with a meal. 04/30/17  Yes Bing Neighbors, FNP  metFORMIN (GLUCOPHAGE) 500 MG tablet Take 1 tablet (500 mg total) by mouth daily with breakfast. 11/01/17  Yes Bing Neighbors, FNP  triamterene-hydrochlorothiazide (DYAZIDE) 37.5-25 MG capsule Take 1 each (1 capsule total) by mouth daily. 11/01/17  Yes Bing Neighbors, FNP  cetirizine (ZYRTEC ALLERGY) 10 MG tablet Take 1 tablet (10 mg total) by mouth daily. 12/25/17   Mathews Robinsons B, PA-C  fluticasone (FLONASE) 50 MCG/ACT nasal spray Place 1 spray into both nostrils daily. 12/25/17   Georgiana Shore, PA-C    promethazine-dextromethorphan (PROMETHAZINE-DM) 6.25-15 MG/5ML syrup Take 5 mLs by mouth 4 (four) times daily as needed for cough. 12/25/17   Georgiana Shore, PA-C    Family History No family history on file.  Social History Social History   Tobacco Use  . Smoking status: Never Smoker  . Smokeless tobacco: Never Used  Substance Use Topics  . Alcohol use: No  . Drug use: No     Allergies   Patient has no known allergies.   Review of Systems Review of Systems  Constitutional: Positive for fever. Negative for activity change, appetite change, chills and diaphoresis.       Subjective fevers  HENT: Positive for congestion. Negative for ear pain, sinus pressure, sinus pain, sore throat, tinnitus, trouble swallowing and voice change.   Respiratory: Positive for cough. Negative for choking, chest tightness, shortness of breath, wheezing and stridor.   Cardiovascular: Negative for chest pain, palpitations and leg swelling.  Gastrointestinal: Negative for abdominal distention, abdominal pain, diarrhea, nausea and vomiting.  Musculoskeletal: Negative for myalgias, neck pain and neck stiffness.  Skin: Negative for color change, pallor and rash.  Neurological: Negative for dizziness, light-headedness and headaches.     Physical Exam Updated Vital Signs BP 132/70   Pulse 70   Temp 98.5 F (36.9 C) (Oral)   Resp 20   Wt Marland Kitchen)  176 kg (388 lb)   SpO2 95%   BMI 55.67 kg/m   Physical Exam  Constitutional: She appears well-developed and well-nourished. No distress.  Afebrile, nontoxic-appearing, sitting comfortably in bed no acute distress.  Patient was satting at 99% on room air on my assessment, heart rate in the 80s.   HENT:  Head: Normocephalic and atraumatic.  Mouth/Throat: Oropharynx is clear and moist. No oropharyngeal exudate.  Eyes: Conjunctivae are normal. Right eye exhibits no discharge. Left eye exhibits no discharge.  Neck: Normal range of motion. Neck supple.   Cardiovascular: Normal rate, regular rhythm and normal heart sounds.  No murmur heard. Pulmonary/Chest: Effort normal and breath sounds normal. No stridor. No respiratory distress. She has no wheezes. She has no rales.  Abdominal: She exhibits no distension.  Musculoskeletal: Normal range of motion. She exhibits no edema or tenderness.  Lymphadenopathy:    She has no cervical adenopathy.  Neurological: She is alert.  Skin: Skin is warm and dry. No rash noted. She is not diaphoretic. No erythema. No pallor.  Psychiatric: She has a normal mood and affect.  Nursing note and vitals reviewed.    ED Treatments / Results  Labs (all labs ordered are listed, but only abnormal results are displayed) Labs Reviewed - No data to display  EKG  EKG Interpretation None       Radiology Dg Chest 2 View  Result Date: 12/25/2017 CLINICAL DATA:  27 y/o  F; 1 week of cough. EXAM: CHEST  2 VIEW COMPARISON:  01/30/2015 chest radiograph FINDINGS: Stable heart size and mediastinal contours are within normal limits. Both lungs are clear. The visualized skeletal structures are unremarkable. IMPRESSION: No acute pulmonary process identified. Electronically Signed   By: Mitzi HansenLance  Furusawa-Stratton M.D.   On: 12/25/2017 02:17    Procedures Procedures (including critical care time)  Medications Ordered in ED Medications - No data to display   Initial Impression / Assessment and Plan / ED Course  I have reviewed the triage vital signs and the nursing notes.  Pertinent labs & imaging results that were available during my care of the patient were reviewed by me and considered in my medical decision making (see chart for details).    Pt CXR negative for acute infiltrate. Patients symptoms are consistent with URI, likely viral etiology. Discussed that antibiotics are not indicated for viral infections. Pt will be discharged with symptomatic treatment.  Verbalizes understanding and is agreeable with plan. Pt is  hemodynamically stable & in NAD prior to dc.  PERC negative, low suspicion for PE. No chest pain, shortness of breath, 99% on room air, no tachycardia.  Patient is otherwise well-appearing, nontoxic afebrile we will discharge home with symptomatic relief and close follow-up with PCP.  Return precautions discussed with patient and understood. Patient agrees with discharge plan.  Final Clinical Impressions(s) / ED Diagnoses   Final diagnoses:  Cough  Viral upper respiratory tract infection    ED Discharge Orders        Ordered    promethazine-dextromethorphan (PROMETHAZINE-DM) 6.25-15 MG/5ML syrup  4 times daily PRN     12/25/17 0245    fluticasone (FLONASE) 50 MCG/ACT nasal spray  Daily     12/25/17 0245    cetirizine (ZYRTEC ALLERGY) 10 MG tablet  Daily     12/25/17 0245       Georgiana ShoreMitchell, Roy Tokarz B, PA-C 12/25/17 0256    Geoffery Lyonselo, Douglas, MD 12/25/17 [phone]

## 2018-01-04 DIAGNOSIS — G4733 Obstructive sleep apnea (adult) (pediatric): Secondary | ICD-10-CM | POA: Diagnosis not present

## 2018-01-19 ENCOUNTER — Ambulatory Visit: Payer: BLUE CROSS/BLUE SHIELD | Admitting: Internal Medicine

## 2018-02-14 ENCOUNTER — Encounter (HOSPITAL_COMMUNITY): Payer: Self-pay

## 2018-02-14 ENCOUNTER — Ambulatory Visit (HOSPITAL_COMMUNITY)
Admission: EM | Admit: 2018-02-14 | Discharge: 2018-02-14 | Disposition: A | Discharge status: Home / Self Care | Payer: BLUE CROSS/BLUE SHIELD | Attending: Family Medicine | Admitting: Family Medicine

## 2018-02-14 ENCOUNTER — Other Ambulatory Visit: Payer: Self-pay

## 2018-02-14 DIAGNOSIS — J22 Unspecified acute lower respiratory infection: Secondary | ICD-10-CM | POA: Diagnosis not present

## 2018-02-14 MED ORDER — PREDNISONE 20 MG PO TABS: 40.0000 mg | ORAL_TABLET | Freq: Every day | ORAL | 0 refills | Status: AC

## 2018-02-14 MED ORDER — ALBUTEROL SULFATE HFA 108 (90 BASE) MCG/ACT IN AERS: 1.0000 | INHALATION_SPRAY | Freq: Four times a day (QID) | RESPIRATORY_TRACT | 0 refills | Status: DC | PRN

## 2018-02-14 MED ORDER — AZITHROMYCIN 250 MG PO TABS: ORAL_TABLET | ORAL | 0 refills | Status: AC

## 2018-02-14 NOTE — ED Provider Notes (Signed)
[facility]    CSN: [id number] Arrival date & time: 02/14/18  1559     History   Chief Complaint Chief Complaint  Patient presents with  . Cough    HPI Johnney OuLashawn Y Ramroop is a [age] y.o. female.   [name] presents with complaints of persistent cough, congestion, runny nose, sneezing, body aches which started two weeks ago. Denies gi/gu complaints. No specific known fevers. Mild sore throat. Denies ear pain. Has been taking cough sytrup, zyrtec, robitussin and tea which has not helped. Without history of asthma. She feels she has had some wheezing at times. Does not smoke. Occasionally short of breath. Hx of htn, hyperlipidemia.     ROS per HPI.      Past Medical History:  Diagnosis Date  . Hyperlipidemia   . Hypertension   . Morbid obesity (HCC)   . Sleep apnea     Patient Active Problem List   Diagnosis Date Noted  . Obstructive sleep apnea 08/02/2017  . Morbid obesity due to excess calories (HCC) 08/02/2017  . HTN (hypertension) 04/26/2017  . Abnormal uterine bleeding (AUB) 03/30/2016    History reviewed. No pertinent surgical history.  OB History    Gravida  0   Para      Term      Preterm      AB      Living  0     SAB      TAB      Ectopic      Multiple      Live Births               Home Medications    Prior to Admission medications   Medication Sig Start Date End Date Taking? Authorizing Provider  cetirizine (ZYRTEC ALLERGY) 10 MG tablet Take 1 tablet (10 mg total) by mouth daily. 12/25/17  Yes Mathews RobinsonsMitchell, Jessica B, PA-C  metFORMIN (GLUCOPHAGE) 500 MG tablet Take 1 tablet (500 mg total) by mouth daily with breakfast. 11/01/17  Yes Bing NeighborsHarris, Kimberly S, FNP  promethazine-dextromethorphan (PROMETHAZINE-DM) 6.25-15 MG/5ML syrup Take 5 mLs by mouth 4 (four) times daily as needed for cough. 12/25/17  Yes Mathews RobinsonsMitchell, Jessica B, PA-C  triamterene-hydrochlorothiazide (DYAZIDE) 37.5-25 MG capsule Take 1 each (1 capsule total) by mouth  daily. 11/01/17  Yes Bing NeighborsHarris, Kimberly S, FNP  albuterol (PROVENTIL HFA;VENTOLIN HFA) 108 (90 Base) MCG/ACT inhaler Inhale 1-2 puffs into the lungs every 6 (six) hours as needed for wheezing or shortness of breath. 02/14/18   Georgetta HaberBurky, Natalie B, NP  azithromycin (ZITHROMAX) 250 MG tablet Take 2 tablets (500 mg total) by mouth daily for 1 day, THEN 1 tablet (250 mg total) daily for 4 days. 02/14/18 02/19/18  Georgetta HaberBurky, Natalie B, NP  ferrous sulfate 325 (65 FE) MG tablet Take 1 tablet (325 mg total) by mouth 2 (two) times daily with a meal. 04/30/17   Bing NeighborsHarris, Kimberly S, FNP  fluticasone (FLONASE) 50 MCG/ACT nasal spray Place 1 spray into both nostrils daily. 12/25/17   Georgiana ShoreMitchell, Jessica B, PA-C  predniSONE (DELTASONE) 20 MG tablet Take 2 tablets (40 mg total) by mouth daily with breakfast for 5 days. 02/14/18 02/19/18  Georgetta HaberBurky, Natalie B, NP    Family History History reviewed. No pertinent family history.  Social History Social History   Tobacco Use  . Smoking status: Never Smoker  . Smokeless tobacco: Never Used  Substance Use Topics  . Alcohol use: No  . Drug use: No     Allergies  Patient has no known allergies.   Review of Systems Review of Systems   Physical Exam Triage Vital Signs ED Triage Vitals  Enc Vitals Group     BP 02/14/18 1614 134/72     Pulse Rate 02/14/18 1614 (!) 103     Resp 02/14/18 1614 18     Temp 02/14/18 1614 98.2 F (36.8 C)     Temp src --      SpO2 02/14/18 1614 100 %     Weight 02/14/18 1613 (!) 388 lb (176 kg)     Height 02/14/18 1613 5\' 10"  (1.778 m)     Head Circumference --      Peak Flow --      Pain Score 02/14/18 1613 8     Pain Loc --      Pain Edu? --      Excl. in GC? --    No data found.  Updated Vital Signs BP 134/72   Pulse (!) 103   Temp 98.2 F (36.8 C)   Resp 18   Ht 5\' 10"  (1.778 m)   Wt (!) 388 lb (176 kg)   SpO2 100%   BMI 55.67 kg/m   Visual Acuity Right Eye Distance:   Left Eye Distance:   Bilateral Distance:    Right  Eye Near:   Left Eye Near:    Bilateral Near:     Physical Exam  Constitutional: She is oriented to person, place, and time. She appears well-developed and well-nourished. No distress.  HENT:  Head: Normocephalic and atraumatic.  Right Ear: Tympanic membrane, external ear and ear canal normal.  Left Ear: Tympanic membrane, external ear and ear canal normal.  Nose: Rhinorrhea present. Right sinus exhibits no maxillary sinus tenderness and no frontal sinus tenderness. Left sinus exhibits no maxillary sinus tenderness and no frontal sinus tenderness.  Mouth/Throat: Uvula is midline, oropharynx is clear and moist and mucous membranes are normal. No tonsillar exudate.  Eyes: Pupils are equal, round, and reactive to light. Conjunctivae and EOM are normal.  Cardiovascular: Normal rate, regular rhythm and normal heart sounds.  Pulmonary/Chest: Effort normal and breath sounds normal.  Hoarse voice noted with occasional strong dry cough noted   Neurological: She is alert and oriented to person, place, and time.  Skin: Skin is warm and dry.     UC Treatments / Results  Labs (all labs ordered are listed, but only abnormal results are displayed) Labs Reviewed - No data to display  EKG None Radiology No results found.  Procedures Procedures (including critical care time)  Medications Ordered in UC Medications - No data to display   Initial Impression / Assessment and Plan / UC Course  I have reviewed the triage vital signs and the nursing notes.  Pertinent labs & imaging results that were available during my care of the patient were reviewed by me and considered in my medical decision making (see chart for details).     Azithromycin and 5 days of prednisone provided. Use of albuterol as needed. Return precautions provided. If symptoms worsen or do not improve in the next week to return to be seen or to follow up with PCP.  Patient verbalized understanding and agreeable to plan.     Final Clinical Impressions(s) / UC Diagnoses   Final diagnoses:  Lower respiratory infection    ED Discharge Orders        Ordered    azithromycin (ZITHROMAX) 250 MG tablet     02/14/18 1704  predniSONE (DELTASONE) 20 MG tablet  Daily with breakfast     02/14/18 1704    albuterol (PROVENTIL HFA;VENTOLIN HFA) 108 (90 Base) MCG/ACT inhaler  Every 6 hours PRN     02/14/18 1704       Controlled Substance Prescriptions [place] Controlled Substance Registry consulted? Not Applicable   Georgetta Haber, NP 02/14/18 1714

## 2018-02-14 NOTE — ED Triage Notes (Signed)
Pt presents with complaints of cough, body aches, runny nose and sneezing x 2 weeks.

## 2018-02-14 NOTE — Discharge Instructions (Addendum)
Push fluids to ensure adequate hydration and keep secretions thin.  Tylenol and/or ibuprofen as needed for pain or fevers.  Complete course of antibiotics.  5 days of prednisone, please monitor your blood sugar as this may become more elevated Albuterol as needed for wheezing or shortness of breath. If symptoms worsen or do not improve in the next week to return to be seen or to follow up with your PCP.

## 2018-02-22 ENCOUNTER — Encounter (HOSPITAL_COMMUNITY): Payer: Self-pay | Admitting: Emergency Medicine

## 2018-02-22 ENCOUNTER — Ambulatory Visit (HOSPITAL_COMMUNITY)
Admission: EM | Admit: 2018-02-22 | Discharge: 2018-02-22 | Disposition: A | Discharge status: Home / Self Care | Payer: BLUE CROSS/BLUE SHIELD | Attending: Family Medicine | Admitting: Family Medicine

## 2018-02-22 ENCOUNTER — Other Ambulatory Visit: Payer: Self-pay

## 2018-02-22 DIAGNOSIS — H60392 Other infective otitis externa, left ear: Secondary | ICD-10-CM

## 2018-02-22 DIAGNOSIS — H6122 Impacted cerumen, left ear: Secondary | ICD-10-CM

## 2018-02-22 MED ORDER — IBUPROFEN 800 MG PO TABS
ORAL_TABLET | ORAL | Status: AC
  Filled 2018-02-22: qty 1

## 2018-02-22 MED ORDER — IPRATROPIUM BROMIDE 0.06 % NA SOLN: 2.0000 | Freq: Four times a day (QID) | NASAL | 0 refills | Status: DC

## 2018-02-22 MED ORDER — FLUTICASONE PROPIONATE 50 MCG/ACT NA SUSP: 1.0000 | Freq: Every day | NASAL | 0 refills | Status: DC

## 2018-02-22 MED ORDER — MELOXICAM 7.5 MG PO TABS: 7.5000 mg | ORAL_TABLET | Freq: Every day | ORAL | 0 refills | Status: DC

## 2018-02-22 MED ORDER — NEOMYCIN-POLYMYXIN-HC 3.5-10000-1 OT SUSP: 4.0000 [drp] | Freq: Four times a day (QID) | OTIC | 0 refills | Status: AC

## 2018-02-22 MED ORDER — IBUPROFEN 800 MG PO TABS
800.0000 mg | ORAL_TABLET | Freq: Once | ORAL | Status: AC
  Administered 2018-02-22: 800 mg via ORAL

## 2018-02-22 NOTE — ED Provider Notes (Signed)
[facility]    CSN: [id number] Arrival date & time: 02/22/18  1252     History   Chief Complaint Chief Complaint  Patient presents with  . Otalgia    left    HPI Grace Daniels is a 27 y.o. female.   27 year old female comes in for 3-day history of left ear pain.  She recently finished a course of Z-Pak and prednisone for lower respiratory infection.  States continues to have mild chest congestion, but denies fever, chills, night sweats.  Denies chest pain, shortness of breath.  Does feel like she hears some wheezing.  Denies nasal congestion, rhinorrhea.  Does use cotton swabs in the ear.  Denies recent travel/swimming.  Has not taken anything for the symptoms.     Past Medical History:  Diagnosis Date  . Hyperlipidemia   . Hypertension   . Morbid obesity (HCC)   . Sleep apnea     Patient Active Problem List   Diagnosis Date Noted  . Obstructive sleep apnea 08/02/2017  . Morbid obesity due to excess calories (HCC) 08/02/2017  . HTN (hypertension) 04/26/2017  . Abnormal uterine bleeding (AUB) 03/30/2016    History reviewed. No pertinent surgical history.  OB History    Gravida  0   Para      Term      Preterm      AB      Living  0     SAB      TAB      Ectopic      Multiple      Live Births               Home Medications    Prior to Admission medications   Medication Sig Start Date End Date Taking? Authorizing Provider  albuterol (PROVENTIL HFA;VENTOLIN HFA) 108 (90 Base) MCG/ACT inhaler Inhale 1-2 puffs into the lungs every 6 (six) hours as needed for wheezing or shortness of breath. 02/14/18  Yes Georgetta Haber, NP  cetirizine (ZYRTEC ALLERGY) 10 MG tablet Take 1 tablet (10 mg total) by mouth daily. 12/25/17  Yes Mathews Robinsons B, PA-C  ferrous sulfate 325 (65 FE) MG tablet Take 1 tablet (325 mg total) by mouth 2 (two) times daily with a meal. 04/30/17  Yes Bing Neighbors, FNP  metFORMIN (GLUCOPHAGE) 500 MG tablet Take  1 tablet (500 mg total) by mouth daily with breakfast. 11/01/17  Yes Bing Neighbors, FNP  promethazine-dextromethorphan (PROMETHAZINE-DM) 6.25-15 MG/5ML syrup Take 5 mLs by mouth 4 (four) times daily as needed for cough. 12/25/17  Yes Mathews Robinsons B, PA-C  triamterene-hydrochlorothiazide (DYAZIDE) 37.5-25 MG capsule Take 1 each (1 capsule total) by mouth daily. 11/01/17  Yes Bing Neighbors, FNP  fluticasone (FLONASE) 50 MCG/ACT nasal spray Place 1 spray into both nostrils daily. 02/22/18   Cathie Hoops, Amy V, PA-C  ipratropium (ATROVENT) 0.06 % nasal spray Place 2 sprays into both nostrils 4 (four) times daily. 02/22/18   Cathie Hoops, Amy V, PA-C  meloxicam (MOBIC) 7.5 MG tablet Take 1 tablet (7.5 mg total) by mouth daily. 02/22/18   Cathie Hoops, Amy V, PA-C  neomycin-polymyxin-hydrocortisone (CORTISPORIN) 3.5-10000-1 OTIC suspension Place 4 drops into the left ear 4 (four) times daily for 7 days. 02/22/18 03/01/18  Belinda Fisher, PA-C    Family History History reviewed. No pertinent family history.  Social History Social History   Tobacco Use  . Smoking status: Never Smoker  . Smokeless tobacco: Never Used  Substance Use  Topics  . Alcohol use: No  . Drug use: No     Allergies   Patient has no known allergies.   Review of Systems Review of Systems  Reason unable to perform ROS: See HPI as above.     Physical Exam Triage Vital Signs ED Triage Vitals  Enc Vitals Group     BP 02/22/18 1304 (!) 142/98     Pulse Rate 02/22/18 1304 (!) 107     Resp --      Temp 02/22/18 1304 98.3 F (36.8 C)     Temp Source 02/22/18 1304 Oral     SpO2 02/22/18 1304 94 %     Weight --      Height --      Head Circumference --      Peak Flow --      Pain Score 02/22/18 1303 10     Pain Loc --      Pain Edu? --      Excl. in GC? --    No data found.  Updated Vital Signs BP (!) 142/98 (BP Location: Left Arm)   Pulse (!) 107   Temp 98.3 F (36.8 C) (Oral)   SpO2 94%   Physical Exam  Constitutional: She is  oriented to person, place, and time. She appears well-developed and well-nourished. No distress.  HENT:  Head: Normocephalic and atraumatic.  Right Ear: External ear and ear canal normal. Tympanic membrane is erythematous. Tympanic membrane is not bulging.  Left Ear: External ear normal.  Nose: Nose normal. Right sinus exhibits no maxillary sinus tenderness and no frontal sinus tenderness. Left sinus exhibits no maxillary sinus tenderness and no frontal sinus tenderness.  Mouth/Throat: Uvula is midline, oropharynx is clear and moist and mucous membranes are normal.  Tenderness to palpation of left tragus.  Cerumen impaction, canal hard to visualize, TM not visible.  Ear irrigation discontinued due to pain. Some cerumen removed. TM still not visible. Ear canal erythematous with swelling.   Eyes: Pupils are equal, round, and reactive to light. Conjunctivae are normal.  Neck: Normal range of motion. Neck supple.  Cardiovascular: Normal rate, regular rhythm and normal heart sounds. Exam reveals no gallop and no friction rub.  No murmur heard. Pulmonary/Chest: Effort normal and breath sounds normal. She has no decreased breath sounds. She has no wheezes. She has no rhonchi. She has no rales.  Lymphadenopathy:    She has no cervical adenopathy.  Neurological: She is alert and oriented to person, place, and time.  Skin: Skin is warm and dry. She is not diaphoretic.  Psychiatric: She has a normal mood and affect. Her behavior is normal. Judgment normal.     UC Treatments / Results  Labs (all labs ordered are listed, but only abnormal results are displayed) Labs Reviewed - No data to display  EKG None Radiology No results found.  Procedures Procedures (including critical care time)  Medications Ordered in UC Medications  ibuprofen (ADVIL,MOTRIN) tablet 800 mg (800 mg Oral Given 02/22/18 1329)     Initial Impression / Assessment and Plan / UC Course  I have reviewed the triage vital  signs and the nursing notes.  Pertinent labs & imaging results that were available during my care of the patient were reviewed by me and considered in my medical decision making (see chart for details).    Will treat for otitis externa given pain and exam. Discussed that cerumen impaction could still play a part in symptoms. Will start cortisporin  for now and have patient monitor symptoms. Given recent illness, will have patient continue nasal sprays for symptoms as well. Return precautions given.   Final Clinical Impressions(s) / UC Diagnoses   Final diagnoses:  Other infective acute otitis externa of left ear  Impacted cerumen of left ear    ED Discharge Orders        Ordered    meloxicam (MOBIC) 7.5 MG tablet  Daily     02/22/18 1347    neomycin-polymyxin-hydrocortisone (CORTISPORIN) 3.5-10000-1 OTIC suspension  4 times daily     02/22/18 1347    fluticasone (FLONASE) 50 MCG/ACT nasal spray  Daily     02/22/18 1348    ipratropium (ATROVENT) 0.06 % nasal spray  4 times daily     02/22/18 1348       Belinda FisherYu, Amy V, PA-C 02/22/18 1353

## 2018-02-22 NOTE — ED Triage Notes (Addendum)
Pt reports left ear pain that developed over the weekend.  Pt was recently treated for a lower respiratory infection.  She states she completed her antibiotics.

## 2018-02-22 NOTE — Discharge Instructions (Addendum)
Discontinued ear wax removal due to the pain. Ear wax could still be playing a part to the pain. I am treating you for outer ear infection. Start cortisporin as directed. Mobic for pain. Continue flonase nasal spray and add atrovent nasal spray in case recent illness is contributing to the ear pain. Follow up for reevaluation if symptoms not improving.

## 2018-05-02 ENCOUNTER — Encounter: Payer: Self-pay | Admitting: Family Medicine

## 2018-05-02 ENCOUNTER — Ambulatory Visit (INDEPENDENT_AMBULATORY_CARE_PROVIDER_SITE_OTHER): Payer: BLUE CROSS/BLUE SHIELD | Admitting: Family Medicine

## 2018-05-02 ENCOUNTER — Ambulatory Visit (HOSPITAL_COMMUNITY)
Admission: RE | Admit: 2018-05-02 | Discharge: 2018-05-02 | Disposition: A | Discharge status: Home / Self Care | Payer: BLUE CROSS/BLUE SHIELD | Source: Ambulatory Visit | Attending: Family Medicine | Admitting: Family Medicine

## 2018-05-02 VITALS — BP 136/90 | HR 100 | Temp 98.0°F | Ht 70.0 in | Wt 385.0 lb

## 2018-05-02 DIAGNOSIS — E119 Type 2 diabetes mellitus without complications: Secondary | ICD-10-CM | POA: Diagnosis not present

## 2018-05-02 DIAGNOSIS — Z09 Encounter for follow-up examination after completed treatment for conditions other than malignant neoplasm: Secondary | ICD-10-CM

## 2018-05-02 DIAGNOSIS — E1165 Type 2 diabetes mellitus with hyperglycemia: Secondary | ICD-10-CM | POA: Insufficient documentation

## 2018-05-02 DIAGNOSIS — R7309 Other abnormal glucose: Secondary | ICD-10-CM

## 2018-05-02 DIAGNOSIS — R739 Hyperglycemia, unspecified: Secondary | ICD-10-CM | POA: Diagnosis not present

## 2018-05-02 DIAGNOSIS — D509 Iron deficiency anemia, unspecified: Secondary | ICD-10-CM

## 2018-05-02 LAB — POCT URINALYSIS DIP (MANUAL ENTRY)
Bilirubin, UA: NEGATIVE
Glucose, UA: 500 mg/dL — AB
Ketones, POC UA: NEGATIVE mg/dL
Leukocytes, UA: NEGATIVE
Nitrite, UA: NEGATIVE
Protein Ur, POC: 30 mg/dL — AB
Spec Grav, UA: 1.01 (ref 1.010–1.025)
Urobilinogen, UA: 0.2 E.U./dL
pH, UA: 6 (ref 5.0–8.0)

## 2018-05-02 LAB — GLUCOSE, POCT (MANUAL RESULT ENTRY)
POC Glucose: 523 mg/dl — AB (ref 70–99)
POC Glucose: 589 mg/dl — AB (ref 70–99)

## 2018-05-02 LAB — POCT GLYCOSYLATED HEMOGLOBIN (HGB A1C): Hemoglobin A1C: 11.9 % — AB (ref 4.0–5.6)

## 2018-05-02 MED ORDER — INSULIN LISPRO 100 UNIT/ML ~~LOC~~ SOLN
10.0000 [IU] | Freq: Once | SUBCUTANEOUS | Status: AC
  Administered 2018-05-02: 14:00:00 10 [IU] via SUBCUTANEOUS

## 2018-05-02 MED ORDER — METFORMIN HCL 500 MG PO TABS: 500.0000 mg | ORAL_TABLET | Freq: Two times a day (BID) | ORAL | 1 refills | Status: DC

## 2018-05-02 MED ORDER — INSULIN LISPRO 100 UNIT/ML ~~LOC~~ SOLN
10.0000 [IU] | Freq: Once | SUBCUTANEOUS | Status: AC
  Administered 2018-05-02: 15:00:00 10 [IU] via SUBCUTANEOUS

## 2018-05-02 MED ORDER — SODIUM CHLORIDE 0.9 % IV SOLN
Freq: Once | INTRAVENOUS | Status: AC
  Administered 2018-05-02: 15:00:00 via INTRAVENOUS

## 2018-05-02 NOTE — Progress Notes (Signed)
huma

## 2018-05-02 NOTE — Discharge Instructions (Signed)
Hyperglycemia Hyperglycemia is when the sugar (glucose) level in your blood is too high. It may not cause symptoms. If you do have symptoms, they may include warning signs, such as:  Feeling more thirsty than normal.  Hunger.  Feeling tired.  Needing to pee (urinate) more than normal.  Blurry eyesight (vision). You may get other symptoms as it gets worse, such as:  Dry mouth.  Not being hungry (loss of appetite).  Fruity-smelling breath.  Weakness.  Weight gain or loss that is not planned. Weight loss may be fast.  A tingling or numb feeling in your hands or feet.  Headache.  Skin that does not bounce back quickly when it is lightly pinched and released (poor skin turgor).  Pain in your belly (abdomen).  Cuts or bruises that heal slowly. High blood sugar can happen to people who do or do not have diabetes. High blood sugar can happen slowly or quickly, and it can be an emergency. Follow these instructions at home: General instructions  Take over-the-counter and prescription medicines only as told by your doctor.  Do not use products that contain nicotine or tobacco, such as cigarettes and e-cigarettes. If you need help quitting, ask your doctor.  Limit alcohol intake to no more than 1 drink per day for nonpregnant women and 2 drinks per day for men. One drink equals 12 oz of beer, 5 oz of wine, or 1 oz of hard liquor.  Manage stress. If you need help with this, ask your doctor.  Keep all follow-up visits as told by your doctor. This is important. Eating and drinking  Stay at a healthy weight.  Exercise regularly, as told by your doctor.  Drink enough fluid, especially when you:  Exercise.  Get sick.  Are in hot temperatures.  Eat healthy foods, such as:  Low-fat (lean) proteins.  Complex carbs (complex carbohydrates), such as whole wheat bread or brown rice.  Fresh fruits and vegetables.  Low-fat dairy products.  Healthy fats.  Drink enough  fluid to keep your pee (urine) clear or pale yellow. If you have diabetes:  Make sure you know the symptoms of hyperglycemia.  Follow your diabetes management plan, as told by your doctor. Make sure you:  Take insulin and medicines as told.  Follow your exercise plan.  Follow your meal plan. Eat on time. Do not skip meals.  Check your blood sugar as often as told. Make sure to check before and after exercise. If you exercise longer or in a different way than you normally do, check your blood sugar more often.  Follow your sick day plan whenever you cannot eat or drink normally. Make this plan ahead of time with your doctor.  Share your diabetes management plan with people in your workplace, school, and household.  Check your urine for ketones when you are ill and as told by your doctor.  Carry a card or wear jewelry that says that you have diabetes. Contact a doctor if:  Your blood sugar level is higher than 240 mg/dL (13.3 mmol/L) for 2 days in a row.  You have problems keeping your blood sugar in your target range.  High blood sugar happens often for you. Get help right away if:  You have trouble breathing.  You have a change in how you think, feel, or act (mental status).  You feel sick to your stomach (nauseous), and that feeling does not go away.  You cannot stop throwing up (vomiting). These symptoms may be an   emergency. Do not wait to see if the symptoms will go away. Get medical help right away. Call your local emergency services (911 in the [place] Do not drive yourself to the hospital.  Summary  Hyperglycemia is when the sugar (glucose) level in your blood is too high.  High blood sugar can happen to people who do or do not have diabetes.  Make sure you drink enough fluids, eat healthy foods, and exercise regularly.  Contact your doctor if you have problems keeping your blood sugar in your target range. This information is not intended to replace advice given  to you by your health care provider. Make sure you discuss any questions you have with your health care provider. Document Released: 08/30/2009 Document Revised: 07/20/2016 Document Reviewed: 07/20/2016 Elsevier Interactive Patient Education  2017 [employer]  

## 2018-05-02 NOTE — Progress Notes (Signed)
b   Subjective:    Patient ID: Grace Daniels, female    DOB: January 06, 1991, 27 y.o.   MRN: [id number]   PCP: Raliegh Ip, NP  Chief Complaint  Patient presents with  . Follow-up    6 month on chronic condition     HPI  [name] has history of Sleep Apnea, Obesity, Hypertension, an Hyperlipidemia. She is here today for follow up.  Current Status: She has increased fatigue and occasional hot flashes.  She denies fevers, chills, recent infections, and weight loss. She has not had any headaches, visual changes, dizziness, and falls. No chest pain, heart palpitations, cough and shortness of breath reported.  No signs or symptoms of Hypoglycemia including: shakiness, dizziness, sweating, hunger, irritability or moodiness. Also no  reports of anxiety or nervousness and headache.  Her blood glucose is 523 today. After 10 units of Novolog it has increased to 589. After 10 additional units of Novolog blood glucose was 532. No other signs and symptoms of Hyperglycemia including: Increased thirst, headaches, trouble concentrating, blurred vision, frequent urination, fatigue (weak, tired feeling) and weight loss, noted.   No reports of GI problems.   She has no reports of blood in stools, dysuria and hematuria.   She has occasional edema in lower extremities.   No depression or anxiety. She has no pain today.   Past Medical History:  Diagnosis Date  . Hyperlipidemia   . Hypertension   . Morbid obesity (HCC)   . Sleep apnea     History reviewed. No pertinent family history.  Social History   Socioeconomic History  . Marital status: Single    Spouse name: Not on file  . Number of children: Not on file  . Years of education: Not on file  . Highest education level: Not on file  Occupational History  . Not on file  Social Needs  . Financial resource strain: Not on file  . Food insecurity:    Worry: Not on file    Inability: Not on file  . Transportation needs:    Medical: Not  on file    Non-medical: Not on file  Tobacco Use  . Smoking status: Never Smoker  . Smokeless tobacco: Never Used  Substance and Sexual Activity  . Alcohol use: No  . Drug use: No  . Sexual activity: Never  Lifestyle  . Physical activity:    Days per week: Not on file    Minutes per session: Not on file  . Stress: Not on file  Relationships  . Social connections:    Talks on phone: Not on file    Gets together: Not on file    Attends religious service: Not on file    Active member of club or organization: Not on file    Attends meetings of clubs or organizations: Not on file    Relationship status: Not on file  . Intimate partner violence:    Fear of current or ex partner: Not on file    Emotionally abused: Not on file    Physically abused: Not on file    Forced sexual activity: Not on file  Other Topics Concern  . Not on file  Social History Narrative  . Not on file    History reviewed. No pertinent surgical history.    There is no immunization history on file for this patient.  Meds ordered this encounter  Medications  . insulin lispro (HUMALOG) injection 10 Units  . DISCONTD: metFORMIN (GLUCOPHAGE) 500  MG tablet    Sig: Take 1 tablet (500 mg total) by mouth 2 (two) times daily with a meal.    Dispense:  60 tablet    Refill:  1  . insulin lispro (HUMALOG) injection 10 Units    No Known Allergies  BP 136/90 (BP Location: Left Wrist, Patient Position: Sitting, Cuff Size: Large)   Pulse 100   Temp 98 F (36.7 C) (Oral)   Ht 5\' 10"  (1.778 m)   Wt (!) 385 lb (174.6 kg)   LMP  (LMP Unknown)   SpO2 98%   BMI 55.24 kg/m     Review of Systems  Constitutional: Negative.   HENT: Negative.   Eyes: Negative.   Respiratory: Negative.   Cardiovascular: Negative.   Gastrointestinal: Negative.   Endocrine: Negative.   Genitourinary: Positive for frequency.  Musculoskeletal: Negative.   Skin: Negative.   Allergic/Immunologic: Negative.   Neurological:  Negative.   Hematological: Negative.   Psychiatric/Behavioral: Negative.    Objective:   Physical Exam  Constitutional: She is oriented to person, place, and time. She appears well-developed and well-nourished.  HENT:  Head: Normocephalic and atraumatic.  Right Ear: External ear normal.  Left Ear: External ear normal.  Nose: Nose normal.  Mouth/Throat: Oropharynx is clear and moist.  Eyes: Pupils are equal, round, and reactive to light. Conjunctivae and EOM are normal.  Neck: Normal range of motion. Neck supple.  Cardiovascular: Normal rate, regular rhythm, normal heart sounds and intact distal pulses.  Pulmonary/Chest: Effort normal and breath sounds normal.  Abdominal: Soft. Bowel sounds are normal.  Musculoskeletal: Normal range of motion.  Neurological: She is alert and oriented to person, place, and time.  Skin: Skin is warm and dry. Capillary refill takes less than 2 seconds.  Psychiatric: She has a normal mood and affect. Her behavior is normal. Judgment and thought content normal.  Nursing note and vitals reviewed.  Assessment & Plan:   1. Type 2 diabetes mellitus without complication, without long-term current use of insulin (HCC) Glucose levels in the 500's today. Total of 20 units of Novolog given. Glucose levels did not decrease significantly. [facility] 500 ml bolus of Normal Saline.  - POCT glycosylated hemoglobin (Hb A1C) - POCT urinalysis dipstick - Comprehensive metabolic panel - TSH - Lipid Panel  2. Elevated glucose - POCT glucose (manual entry) - insulin lispro (HUMALOG) injection 10 Units - POCT glucose (manual entry) - insulin lispro (HUMALOG) injection 10 Units - POCT glucose (manual entry)  3. Iron deficiency anemia, unspecified iron deficiency anemia type Iron levels were decreased at 22 on 11/01/2017. We will reassess TIBC today.  - CBC with Differential - Iron and TIBC([facility])  4. Hyperglycemia Blood  glucose levels in 500's today. Total of 20 units of Novolog given.   5. Follow up We will follow up in 2 weeks.   Meds ordered this encounter  Medications  . insulin lispro (HUMALOG) injection 10 Units  . DISCONTD: metFORMIN (GLUCOPHAGE) 500 MG tablet    Sig: Take 1 tablet (500 mg total) by mouth 2 (two) times daily with a meal.    Dispense:  60 tablet    Refill:  1  . insulin lispro (HUMALOG) injection 10 Units    Raliegh IpNatalie Hatcher Froning,  MSN, FNP-BC [facility] [address]  [place], [place] [id number] [phone]  [facility] to [facility] for 500 ml of Normal Saline Bolus X 1 hours.

## 2018-05-02 NOTE — Progress Notes (Signed)
PATIENT CARE CENTER NOTE  Diagnosis: Hyperglycemia    Provider: Raliegh IpNatalie Stroud, FNP   Procedure: 500 cc bolus and CBG check   Note: Patient received 500 cc bolus of 0.9% Normal Saline. Patient's blood sugar rechecked after bolus and is now 396. Provider notified. Discharge instructions given to patient. Patient alert,oriented and ambulatory at discharge.

## 2018-05-03 ENCOUNTER — Other Ambulatory Visit: Payer: Self-pay | Admitting: Family Medicine

## 2018-05-03 ENCOUNTER — Telehealth: Payer: Self-pay | Admitting: Family Medicine

## 2018-05-03 ENCOUNTER — Emergency Department
Admission: EM | Admit: 2018-05-03 | Discharge: 2018-05-03 | Disposition: A | Discharge status: Home / Self Care | Payer: BLUE CROSS/BLUE SHIELD | Attending: Emergency Medicine | Admitting: Emergency Medicine

## 2018-05-03 ENCOUNTER — Other Ambulatory Visit: Payer: Self-pay

## 2018-05-03 ENCOUNTER — Encounter: Payer: Self-pay | Admitting: Emergency Medicine

## 2018-05-03 DIAGNOSIS — Z79899 Other long term (current) drug therapy: Secondary | ICD-10-CM | POA: Diagnosis not present

## 2018-05-03 DIAGNOSIS — I10 Essential (primary) hypertension: Secondary | ICD-10-CM | POA: Insufficient documentation

## 2018-05-03 DIAGNOSIS — R739 Hyperglycemia, unspecified: Secondary | ICD-10-CM | POA: Diagnosis not present

## 2018-05-03 DIAGNOSIS — E860 Dehydration: Secondary | ICD-10-CM | POA: Diagnosis not present

## 2018-05-03 DIAGNOSIS — E1165 Type 2 diabetes mellitus with hyperglycemia: Secondary | ICD-10-CM | POA: Diagnosis not present

## 2018-05-03 DIAGNOSIS — E119 Type 2 diabetes mellitus without complications: Secondary | ICD-10-CM

## 2018-05-03 DIAGNOSIS — E669 Obesity, unspecified: Secondary | ICD-10-CM

## 2018-05-03 DIAGNOSIS — R7309 Other abnormal glucose: Secondary | ICD-10-CM

## 2018-05-03 DIAGNOSIS — E1169 Type 2 diabetes mellitus with other specified complication: Secondary | ICD-10-CM

## 2018-05-03 LAB — BLOOD GAS, VENOUS
Acid-base deficit: 2 mmol/L (ref 0.0–2.0)
Bicarbonate: 23.7 mmol/L (ref 20.0–28.0)
O2 SAT: 65.5 %
PATIENT TEMPERATURE: 37
pCO2, Ven: 43 mmHg — ABNORMAL LOW (ref 44.0–60.0)
pH, Ven: 7.35 (ref 7.250–7.430)
pO2, Ven: 36 mmHg (ref 32.0–45.0)

## 2018-05-03 LAB — GLUCOSE, CAPILLARY
GLUCOSE-CAPILLARY: 392 mg/dL — AB (ref 65–99)
Glucose-Capillary: 380 mg/dL — ABNORMAL HIGH (ref 65–99)

## 2018-05-03 LAB — URINALYSIS, COMPLETE (UACMP) WITH MICROSCOPIC
Bilirubin Urine: NEGATIVE
Ketones, ur: NEGATIVE mg/dL
Nitrite: NEGATIVE
PROTEIN: 30 mg/dL — AB
Specific Gravity, Urine: 1.002 — ABNORMAL LOW (ref 1.005–1.030)
pH: 6 (ref 5.0–8.0)

## 2018-05-03 LAB — COMPREHENSIVE METABOLIC PANEL
ALT: 10 IU/L (ref 0–32)
AST: 6 IU/L (ref 0–40)
Albumin/Globulin Ratio: 1.1 — ABNORMAL LOW (ref 1.2–2.2)
Albumin: 4.1 g/dL (ref 3.5–5.5)
Alkaline Phosphatase: 78 IU/L (ref 39–117)
BUN/Creatinine Ratio: 9 (ref 9–23)
BUN: 7 mg/dL (ref 6–20)
Bilirubin Total: <0.2 mg/dL (ref 0.0–1.2)
CO2: 19 mmol/L — ABNORMAL LOW (ref 20–29)
Calcium: 9.7 mg/dL (ref 8.7–10.2)
Chloride: 98 mmol/L (ref 96–106)
Creatinine, Ser: 0.81 mg/dL (ref 0.57–1.00)
GFR calc Af Amer: 114 mL/min/{1.73_m2} (ref >59)
GFR calc non Af Amer: 99 mL/min/{1.73_m2} (ref >59)
Globulin, Total: 3.7 g/dL (ref 1.5–4.5)
Glucose: 532 mg/dL (ref 65–99)
Potassium: 4.3 mmol/L (ref 3.5–5.2)
Sodium: 134 mmol/L (ref 134–144)
Total Protein: 7.8 g/dL (ref 6.0–8.5)

## 2018-05-03 LAB — CBC WITH DIFFERENTIAL/PLATELET
Basophils Absolute: 0 10*3/uL (ref 0.0–0.2)
Basos: 0 %
EOS (ABSOLUTE): 0.1 10*3/uL (ref 0.0–0.4)
Eos: 2 %
Hematocrit: 37.1 % (ref 34.0–46.6)
Hemoglobin: 11.4 g/dL (ref 11.1–15.9)
Immature Grans (Abs): 0 10*3/uL (ref 0.0–0.1)
Immature Granulocytes: 0 %
Lymphocytes Absolute: 3.1 10*3/uL (ref 0.7–3.1)
Lymphs: 42 %
MCH: 19.6 pg — ABNORMAL LOW (ref 26.6–33.0)
MCHC: 30.7 g/dL — ABNORMAL LOW (ref 31.5–35.7)
MCV: 64 fL — ABNORMAL LOW (ref 79–97)
Monocytes Absolute: 0.6 10*3/uL (ref 0.1–0.9)
Monocytes: 8 %
Neutrophils Absolute: 3.6 10*3/uL (ref 1.4–7.0)
Neutrophils: 48 %
Platelets: 338 10*3/uL (ref 150–450)
RBC: 5.83 x10E6/uL — ABNORMAL HIGH (ref 3.77–5.28)
RDW: 19.5 % — ABNORMAL HIGH (ref 12.3–15.4)
WBC: 7.5 10*3/uL (ref 3.4–10.8)

## 2018-05-03 LAB — BASIC METABOLIC PANEL
Anion gap: 12 (ref 5–15)
BUN: 12 mg/dL (ref 6–20)
CALCIUM: 9.6 mg/dL (ref 8.9–10.3)
CO2: 21 mmol/L — ABNORMAL LOW (ref 22–32)
CREATININE: 0.77 mg/dL (ref 0.44–1.00)
Chloride: 101 mmol/L (ref 101–111)
GFR calc Af Amer: >60 mL/min (ref >60)
Glucose, Bld: 435 mg/dL — ABNORMAL HIGH (ref 65–99)
Potassium: 3.7 mmol/L (ref 3.5–5.1)
SODIUM: 134 mmol/L — AB (ref 135–145)

## 2018-05-03 LAB — LIPID PANEL
Chol/HDL Ratio: 5.9 ratio — ABNORMAL HIGH (ref 0.0–4.4)
Cholesterol, Total: 141 mg/dL (ref 100–199)
HDL: 24 mg/dL — ABNORMAL LOW (ref >39)
LDL Calculated: 55 mg/dL (ref 0–99)
Triglycerides: 312 mg/dL — ABNORMAL HIGH (ref 0–149)
VLDL Cholesterol Cal: 62 mg/dL — ABNORMAL HIGH (ref 5–40)

## 2018-05-03 LAB — CBC
HCT: 36.7 % (ref 35.0–47.0)
Hemoglobin: 11.9 g/dL — ABNORMAL LOW (ref 12.0–16.0)
MCH: 20 pg — AB (ref 26.0–34.0)
MCHC: 32.3 g/dL (ref 32.0–36.0)
MCV: 61.9 fL — ABNORMAL LOW (ref 80.0–100.0)
PLATELETS: 329 10*3/uL (ref 150–440)
RBC: 5.94 MIL/uL — AB (ref 3.80–5.20)
RDW: 19.3 % — ABNORMAL HIGH (ref 11.5–14.5)
WBC: 10.7 10*3/uL (ref 3.6–11.0)

## 2018-05-03 LAB — TSH: TSH: 2.05 u[IU]/mL (ref 0.450–4.500)

## 2018-05-03 LAB — IRON AND TIBC
Iron Saturation: 8 % — CL (ref 15–55)
Iron: 33 ug/dL (ref 27–159)
Total Iron Binding Capacity: 399 ug/dL (ref 250–450)
UIBC: 366 ug/dL (ref 131–425)

## 2018-05-03 MED ORDER — GLIPIZIDE 10 MG PO TABS: 10.0000 mg | ORAL_TABLET | Freq: Two times a day (BID) | ORAL | 3 refills | Status: DC

## 2018-05-03 MED ORDER — METFORMIN HCL 1000 MG PO TABS: 1000.0000 mg | ORAL_TABLET | Freq: Two times a day (BID) | ORAL | 3 refills | Status: DC

## 2018-05-03 MED ORDER — INSULIN ASPART 100 UNIT/ML ~~LOC~~ SOLN
10.0000 [IU] | Freq: Once | SUBCUTANEOUS | Status: AC
  Administered 2018-05-03: 10 [IU] via SUBCUTANEOUS
  Filled 2018-05-03: qty 1

## 2018-05-03 MED ORDER — SODIUM CHLORIDE 0.9 % IV BOLUS
1000.0000 mL | Freq: Once | INTRAVENOUS | Status: AC
  Administered 2018-05-03: 1000 mL via INTRAVENOUS

## 2018-05-03 NOTE — ED Provider Notes (Signed)
[facility] Emergency Department Provider Note  ____________________________________________   First MD Initiated Contact with Patient 05/03/18 1630     (approximate)  I have reviewed the triage vital signs and the nursing notes.   HISTORY  Chief Complaint Dizziness and Hyperglycemia   HPI Grace Daniels is a 27 y.o. female who self presents to the emergency department for evaluation of polyuria polydipsia hyperglycemia mild nausea and generalized fatigue.  She has a recent diagnosis of diabetes mellitus about a year ago and has had difficulty managing her blood sugar ever since her diagnosis.  She thinks her last hemoglobin A1c was about 11.  She had been only taking metformin however yesterday went to see her primary care physician and her blood sugar was 500.  She was given IV normal saline as well as 20 units of insulin and after it came down she felt somewhat improved.  Her physician added on a sulfonylurea that the patient has yet to begin taking.  Her symptoms have been gradual onset mostly constant are improved with insulin nothing particular seems to make them worse.  There are moderate severity.  Her symptoms are nonradiating.  Past Medical History:  Diagnosis Date  . Hyperlipidemia   . Hypertension   . Morbid obesity (HCC)   . Sleep apnea     Patient Active Problem List   Diagnosis Date Noted  . Obstructive sleep apnea 08/02/2017  . Morbid obesity due to excess calories (HCC) 08/02/2017  . HTN (hypertension) 04/26/2017  . Abnormal uterine bleeding (AUB) 03/30/2016    History reviewed. No pertinent surgical history.  Prior to Admission medications   Medication Sig Start Date End Date Taking? Authorizing Provider  glipiZIDE (GLUCOTROL) 10 MG tablet Take 1 tablet (10 mg total) by mouth 2 (two) times daily. 05/03/18 05/03/19 Yes Kallie Locks, FNP  metFORMIN (GLUCOPHAGE) 1000 MG tablet Take 1 tablet (1,000 mg total) by mouth 2 (two) times  daily with a meal. Patient taking differently: Take 500 mg by mouth 2 (two) times daily with a meal.  05/03/18 05/03/19 Yes Kallie Locks, FNP  triamterene-hydrochlorothiazide (DYAZIDE) 37.5-25 MG capsule Take 1 each (1 capsule total) by mouth daily. 11/01/17  Yes Bing Neighbors, FNP  albuterol (PROVENTIL HFA;VENTOLIN HFA) 108 (90 Base) MCG/ACT inhaler Inhale 1-2 puffs into the lungs every 6 (six) hours as needed for wheezing or shortness of breath. 02/14/18   Georgetta Haber, NP  cetirizine (ZYRTEC ALLERGY) 10 MG tablet Take 1 tablet (10 mg total) by mouth daily. Patient not taking: Reported on 05/02/2018 12/25/17   Mathews Robinsons B, PA-C  ferrous sulfate 325 (65 FE) MG tablet Take 1 tablet (325 mg total) by mouth 2 (two) times daily with a meal. Patient not taking: Reported on 05/02/2018 04/30/17   Bing Neighbors, FNP  fluticasone (FLONASE) 50 MCG/ACT nasal spray Place 1 spray into both nostrils daily. Patient not taking: Reported on 05/02/2018 02/22/18   Belinda Fisher, PA-C  ipratropium (ATROVENT) 0.06 % nasal spray Place 2 sprays into both nostrils 4 (four) times daily. Patient not taking: Reported on 05/02/2018 02/22/18   Belinda Fisher, PA-C    Allergies Patient has no known allergies.  History reviewed. No pertinent family history.  Social History Social History   Tobacco Use  . Smoking status: Never Smoker  . Smokeless tobacco: Never Used  Substance Use Topics  . Alcohol use: No  . Drug use: No    Review of Systems Constitutional: No  fever/chills Eyes: No visual changes. ENT: No sore throat. Cardiovascular: Denies chest pain. Respiratory: Denies shortness of breath. Gastrointestinal: No abdominal pain.  Positive for nausea, no vomiting.  No diarrhea.  No constipation. Genitourinary: Negative for dysuria. Musculoskeletal: Negative for back pain. Skin: Negative for rash. Neurological: Negative for headaches, focal weakness or  numbness.   ____________________________________________   PHYSICAL EXAM:  VITAL SIGNS: ED Triage Vitals  Enc Vitals Group     BP 05/03/18 1621 (!) 156/107     Pulse Rate 05/03/18 1621 (!) 110     Resp 05/03/18 1621 20     Temp 05/03/18 1621 97.8 F (36.6 C)     Temp Source 05/03/18 1621 Oral     SpO2 05/03/18 1621 95 %     Weight 05/03/18 1622 (!) 385 lb (174.6 kg)     Height 05/03/18 1622 5\' 10"  (1.778 m)     Head Circumference --      Peak Flow --      Pain Score 05/03/18 1622 0     Pain Loc --      Pain Edu? --      Excl. in GC? --     Constitutional: Alert and oriented x4 pleasant cooperative speaks in full clear sentences eating junk food when I come into the room to talk with her. Eyes: PERRL EOMI. Head: Atraumatic. Nose: No congestion/rhinnorhea. Mouth/Throat: No trismus Neck: No stridor.   Cardiovascular: Tachycardic rate, regular rhythm. Grossly normal heart sounds.  Good peripheral circulation. Respiratory: Normal respiratory effort.  No retractions. Lungs CTAB and moving good air Gastrointestinal: Obese soft nontender Musculoskeletal: No lower extremity edema   Neurologic:  Normal speech and language. No gross focal neurologic deficits are appreciated. Skin:  Skin is warm, dry and intact. No rash noted. Psychiatric: Mood and affect are normal. Speech and behavior are normal.    ____________________________________________   DIFFERENTIAL includes but not limited to  DKA, HHS, dehydration, hypoglycemia ____________________________________________   LABS (all labs ordered are listed, but only abnormal results are displayed)  Labs Reviewed  BASIC METABOLIC PANEL - Abnormal; Notable for the following components:      Result Value   Sodium 134 (*)    CO2 21 (*)    Glucose, Bld 435 (*)    All other components within normal limits  CBC - Abnormal; Notable for the following components:   RBC 5.94 (*)    Hemoglobin 11.9 (*)    MCV 61.9 (*)    MCH  20.0 (*)    RDW 19.3 (*)    All other components within normal limits  URINALYSIS, COMPLETE (UACMP) WITH MICROSCOPIC - Abnormal; Notable for the following components:   Color, Urine YELLOW (*)    APPearance HAZY (*)    Specific Gravity, Urine 1.002 (*)    Glucose, UA >=500 (*)    Hgb urine dipstick LARGE (*)    Protein, ur 30 (*)    Leukocytes, UA TRACE (*)    RBC / HPF >50 (*)    Bacteria, UA RARE (*)    All other components within normal limits  GLUCOSE, CAPILLARY - Abnormal; Notable for the following components:   Glucose-Capillary 392 (*)    All other components within normal limits  BLOOD GAS, VENOUS - Abnormal; Notable for the following components:   pCO2, Ven 43 (*)    All other components within normal limits  HEMOGLOBIN A1C - Abnormal; Notable for the following components:   Hgb A1c MFr Bld 10.6 (*)  All other components within normal limits  GLUCOSE, CAPILLARY - Abnormal; Notable for the following components:   Glucose-Capillary 380 (*)    All other components within normal limits  CBG MONITORING, ED    Lab work reviewed by me with hyperglycemia but no signs of DKA __________________________________________  EKG  ED ECG REPORT I, Merrily BrittleNeil Elexa Kivi, the attending physician, personally viewed and interpreted this ECG.  Date: 05/03/2018 EKG Time:  Rate: 116 Rhythm: Sinus tachycardia QRS Axis: Rightward axis Intervals: normal ST/T Wave abnormalities: S1Q3T3 consistent with previous EKG performed March 1626 Narrative Interpretation: no evidence of acute ischemia  ____________________________________________  RADIOLOGY   ____________________________________________   PROCEDURES  Procedure(s) performed: no  Procedures  Critical Care performed: no  Observation: no ____________________________________________   INITIAL IMPRESSION / ASSESSMENT AND PLAN / ED COURSE  Pertinent labs & imaging results that were available during my care of the patient  were reviewed by me and considered in my medical decision making (see chart for details).  The patient arrives slightly tachycardic but overall quite well-appearing.  Her blood sugar in clinic yesterday was 530 with an anion gap of 17 and a bicarbonate of 19.    Given IV fluids and subcutaneous insulin and her sugar has come down and she feels significantly improved.  I do lengthy discussion with the patient regarding the importance of dietary control as well as losing weight to help maintain tight glucose control.  She is discharged home with her previously prescribed new diabetic medications and primary care follow-up.  She verbalized understanding agree with plan. ____________________________________________   FINAL CLINICAL IMPRESSION(S) / ED DIAGNOSES  Final diagnoses:  Dehydration  Hyperglycemia  Morbid obesity (HCC)      NEW MEDICATIONS STARTED DURING THIS VISIT:  Discharge Medication List as of 05/03/2018  6:07 PM       Note:  This document was prepared using Dragon voice recognition software and may include unintentional dictation errors.     Merrily Brittleifenbark, Shevonne Wolf, MD 05/05/18 [phone]

## 2018-05-03 NOTE — Telephone Encounter (Signed)
Grace Daniels Daniels, a 27 year old female with a history of uncontrolled type 2 diabetes mellitus was evaluated in office on 05/02/2018. Received a critical glucose of 532 this am. Attempted to reach patient, voicemail full unable to leave message.   On reviewing MR, patient's glucose was markedly elevated in office on 05/02/2018. She received Humalog 20 units and a 500 ml fluid bolus. CBG decreased to 396 prior to discharge.   Will notify primary provider of critical value.     Grace NationsLachina Moore Loui Massenburg  MSN, FNP-C [facility] [address]  [place], [place] [id number] [phone]

## 2018-05-03 NOTE — Progress Notes (Signed)
Rx for Metformin and Glucotrol sent to pharmacy today.

## 2018-05-03 NOTE — Progress Notes (Signed)
Referral for Diabetic teaching and Nutrition.

## 2018-05-03 NOTE — Discharge Instructions (Addendum)
Please begin your new diabetic regimen today as prescribed by your primary care physician but make sure you follow-up with your doctor tomorrow for reevaluation.  Return to the emergency department for any concerns whatsoever.  It was a pleasure to take care of you today, and thank you for coming to our emergency department.  If you have any questions or concerns before leaving please ask the nurse to grab me and I'm more than happy to go through your aftercare instructions again.  If you were prescribed any opioid pain medication today such as Norco, Vicodin, Percocet, morphine, hydrocodone, or oxycodone please make sure you do not drive when you are taking this medication as it can alter your ability to drive safely.  If you have any concerns once you are home that you are not improving or are in fact getting worse before you can make it to your follow-up appointment, please do not hesitate to call 911 and come back for further evaluation.  Merrily BrittleNeil Mita Vallo, MD  Results for orders placed or performed during the hospital encounter of 05/03/18  Basic metabolic panel  Result Value Ref Range   Sodium 134 (L) 135 - 145 mmol/L   Potassium 3.7 3.5 - 5.1 mmol/L   Chloride 101 101 - 111 mmol/L   CO2 21 (L) 22 - 32 mmol/L   Glucose, Bld 435 (H) 65 - 99 mg/dL   BUN 12 6 - 20 mg/dL   Creatinine, Ser 5.360.77 0.44 - 1.00 mg/dL   Calcium 9.6 8.9 - 64.410.3 mg/dL   GFR calc non Af Amer >60 >60 mL/min   GFR calc Af Amer >60 >60 mL/min   Anion gap 12 5 - 15  CBC  Result Value Ref Range   WBC 10.7 3.6 - 11.0 K/uL   RBC 5.94 (H) 3.80 - 5.20 MIL/uL   Hemoglobin 11.9 (L) 12.0 - 16.0 g/dL   HCT 03.436.7 74.235.0 - 59.547.0 %   MCV 61.9 (L) 80.0 - 100.0 fL   MCH 20.0 (L) 26.0 - 34.0 pg   MCHC 32.3 32.0 - 36.0 g/dL   RDW 63.819.3 (H) 75.611.5 - 43.314.5 %   Platelets 329 150 - 440 K/uL  Glucose, capillary  Result Value Ref Range   Glucose-Capillary 392 (H) 65 - 99 mg/dL  Blood gas, venous  Result Value Ref Range   pH, Ven 7.35  7.250 - 7.430   pCO2, Ven 43 (L) 44.0 - 60.0 mmHg   pO2, Ven 36.0 32.0 - 45.0 mmHg   Bicarbonate 23.7 20.0 - 28.0 mmol/L   Acid-base deficit 2.0 0.0 - 2.0 mmol/L   O2 Saturation 65.5 %   Patient temperature 37.0    Collection site VEIN    Sample type VENOUS   Glucose, capillary  Result Value Ref Range   Glucose-Capillary 380 (H) 65 - 99 mg/dL

## 2018-05-03 NOTE — ED Triage Notes (Addendum)
Pt c/o intermittent dizziness, pt states was seen yesterday at a doctors office and was given insulin and fluids to bring her blood sugar down. Pt states today checked her CBG and it was 396 at work. Pt is alert and oriented, NAD noted at this time. Pt states is currently taking Metformin for new dx of DM2.

## 2018-05-04 LAB — HEMOGLOBIN A1C
Hgb A1c MFr Bld: 10.6 % — ABNORMAL HIGH (ref 4.8–5.6)
Mean Plasma Glucose: 257.52 mg/dL

## 2018-05-12 ENCOUNTER — Other Ambulatory Visit: Payer: Self-pay | Admitting: Family Medicine

## 2018-05-16 ENCOUNTER — Encounter: Payer: Self-pay | Admitting: Family Medicine

## 2018-05-16 ENCOUNTER — Ambulatory Visit (INDEPENDENT_AMBULATORY_CARE_PROVIDER_SITE_OTHER): Payer: BLUE CROSS/BLUE SHIELD | Admitting: Family Medicine

## 2018-05-16 VITALS — BP 140/90 | HR 80 | Temp 98.0°F | Ht 70.0 in | Wt 387.0 lb

## 2018-05-16 DIAGNOSIS — E119 Type 2 diabetes mellitus without complications: Secondary | ICD-10-CM

## 2018-05-16 DIAGNOSIS — I10 Essential (primary) hypertension: Secondary | ICD-10-CM | POA: Diagnosis not present

## 2018-05-16 DIAGNOSIS — R809 Proteinuria, unspecified: Secondary | ICD-10-CM | POA: Diagnosis not present

## 2018-05-16 DIAGNOSIS — R8290 Unspecified abnormal findings in urine: Secondary | ICD-10-CM

## 2018-05-16 DIAGNOSIS — E611 Iron deficiency: Secondary | ICD-10-CM

## 2018-05-16 DIAGNOSIS — R739 Hyperglycemia, unspecified: Secondary | ICD-10-CM

## 2018-05-16 DIAGNOSIS — R5383 Other fatigue: Secondary | ICD-10-CM

## 2018-05-16 DIAGNOSIS — H538 Other visual disturbances: Secondary | ICD-10-CM

## 2018-05-16 DIAGNOSIS — Z09 Encounter for follow-up examination after completed treatment for conditions other than malignant neoplasm: Secondary | ICD-10-CM

## 2018-05-16 LAB — POCT URINALYSIS DIP (MANUAL ENTRY)
Bilirubin, UA: NEGATIVE
Glucose, UA: 500 mg/dL — AB
Ketones, POC UA: NEGATIVE mg/dL
Leukocytes, UA: NEGATIVE
Nitrite, UA: NEGATIVE
Protein Ur, POC: 30 mg/dL — AB
Spec Grav, UA: 1.025 (ref 1.010–1.025)
Urobilinogen, UA: 1 E.U./dL
pH, UA: 5 (ref 5.0–8.0)

## 2018-05-16 LAB — GLUCOSE, POCT (MANUAL RESULT ENTRY)
POC Glucose: 354 mg/dl — AB (ref 70–99)
POC Glucose: 370 mg/dl — AB (ref 70–99)
POC Glucose: 392 mg/dl — AB (ref 70–99)

## 2018-05-16 LAB — POCT GLYCOSYLATED HEMOGLOBIN (HGB A1C): Hemoglobin A1C: 11.4 % — AB (ref 4.0–5.6)

## 2018-05-16 MED ORDER — INSULIN LISPRO 100 UNIT/ML ~~LOC~~ SOLN
10.0000 [IU] | Freq: Once | SUBCUTANEOUS | Status: AC
Start: 2018-05-16 — End: 2018-05-16
  Administered 2018-05-16: 10 [IU] via SUBCUTANEOUS

## 2018-05-16 MED ORDER — ACURA BLOOD GLUCOSE METER W/DEVICE KIT: 1.0000 | PACK | Freq: Three times a day (TID) | 1 refills | Status: DC

## 2018-05-16 MED ORDER — GLUCOSE BLOOD VI STRP: ORAL_STRIP | 12 refills | Status: DC

## 2018-05-16 NOTE — Progress Notes (Signed)
Subjective:    Patient ID: Grace Daniels, female    DOB: May 04, 1991, 27 y.o.   MRN: [id number]   PCP: Kathe Becton, NP  Chief Complaint  Patient presents with  . Follow-up    2 weeks on diabetes    HPI  [name] has a history of Sleep Apnea, Morbid Obesity, Hypertension, and Hyperlipidemia.   Current Status: Since her last office visit, she is doing well with no complaints. She does report mild fatigue and mild blurry vision, which she states is improving. She denies increased thirst, headaches, trouble concentrating, frequent urination, fatigue and weight loss.  She denies fevers, chills, recent infections, weight loss, and night sweats.   She has not had any dizziness, and falls.   No chest pain, heart palpitations, cough and shortness of breath reported.   No reports of GI problems such as nausea, vomiting, diarrhea, and constipation. She has no reports of blood in stools, dysuria and hematuria.   She has mild anxiety, and denies suicidal ideations.   She denies pain today.   Past Medical History:  Diagnosis Date  . Hyperlipidemia   . Hypertension   . Morbid obesity ([place])   . Sleep apnea     History reviewed. No pertinent family history.  Social History   Socioeconomic History  . Marital status: Single    Spouse name: Not on file  . Number of children: Not on file  . Years of education: Not on file  . Highest education level: Not on file  Occupational History  . Not on file  Social Needs  . Financial resource strain: Not on file  . Food insecurity:    Worry: Not on file    Inability: Not on file  . Transportation needs:    Medical: Not on file    Non-medical: Not on file  Tobacco Use  . Smoking status: Never Smoker  . Smokeless tobacco: Never Used  Substance and Sexual Activity  . Alcohol use: No  . Drug use: No  . Sexual activity: Never  Lifestyle  . Physical activity:    Days per week: Not on file    Minutes per session: Not on file  .  Stress: Not on file  Relationships  . Social connections:    Talks on phone: Not on file    Gets together: Not on file    Attends religious service: Not on file    Active member of club or organization: Not on file    Attends meetings of clubs or organizations: Not on file    Relationship status: Not on file  . Intimate partner violence:    Fear of current or ex partner: Not on file    Emotionally abused: Not on file    Physically abused: Not on file    Forced sexual activity: Not on file  Other Topics Concern  . Not on file  Social History Narrative  . Not on file    History reviewed. No pertinent surgical history.    There is no immunization history on file for this patient.  Current Meds  Medication Sig  . albuterol (PROVENTIL HFA;VENTOLIN HFA) 108 (90 Base) MCG/ACT inhaler Inhale 1-2 puffs into the lungs every 6 (six) hours as needed for wheezing or shortness of breath.  . cetirizine (ZYRTEC ALLERGY) 10 MG tablet Take 1 tablet (10 mg total) by mouth daily.  . ferrous sulfate 325 (65 FE) MG tablet Take 1 tablet (325 mg total) by mouth 2 (two)  times daily with a meal.  . glipiZIDE (GLUCOTROL) 10 MG tablet Take 1 tablet (10 mg total) by mouth 2 (two) times daily.  . metFORMIN (GLUCOPHAGE) 1000 MG tablet Take 1 tablet (1,000 mg total) by mouth 2 (two) times daily with a meal. (Patient taking differently: Take 500 mg by mouth 2 (two) times daily with a meal. )  . triamterene-hydrochlorothiazide (DYAZIDE) 37.5-25 MG capsule Take 1 each (1 capsule total) by mouth daily.    No Known Allergies  BP 140/90 (BP Location: Left Arm, Patient Position: Sitting, Cuff Size: Large)   Pulse 80   Temp 98 F (36.7 C) (Oral)   Ht _0  (1.778 m)   Wt (!) 387 lb (175.5 kg)   LMP 05/11/2018   SpO2 99%   BMI 55.53 kg/m   Review of Systems  Constitutional: Negative.   HENT: Negative.   Eyes: Positive for visual disturbance (blurry vision---improving).  Respiratory: Negative.    Cardiovascular: Negative.   Gastrointestinal: Positive for abdominal distention (Obese).  Endocrine: Negative.   Genitourinary: Negative.   Skin: Negative.   Allergic/Immunologic: Negative.   Neurological: Negative.   Hematological: Negative.   Psychiatric/Behavioral: Negative.    Objective:   Physical Exam  Constitutional: She is oriented to person, place, and time. She appears well-developed and well-nourished.  HENT:  Head: Normocephalic and atraumatic.  Right Ear: External ear normal.  Left Ear: External ear normal.  Nose: Nose normal.  Mouth/Throat: Oropharynx is clear and moist.  Eyes: Pupils are equal, round, and reactive to light. Conjunctivae and EOM are normal.  Neck: Normal range of motion. Neck supple.  Cardiovascular: Normal rate, regular rhythm, normal heart sounds and intact distal pulses.  Pulmonary/Chest: Effort normal and breath sounds normal.  Abdominal: Soft. Bowel sounds are normal. She exhibits distension (Obese).  Musculoskeletal: Normal range of motion. She exhibits edema (right ankle sweling).  Neurological: She is alert and oriented to person, place, and time.  Skin: Skin is warm and dry. Capillary refill takes less than 2 seconds. There is erythema.  Psychiatric: She has a normal mood and affect. Her behavior is normal. Judgment and thought content normal.  Nursing note and vitals reviewed. l Assessment & Plan:   1. Essential hypertension Blood pressure is 140/90 today. She took bp meds just before her office visit this morning. Continue Triamterene/HCTZ as prescribed.    She will decrease high sodium intake, excessive alcohol intake, increase potassium intake, smoking cessation, and increase physical activity with at least 30 minutes of cardio daily. Follow DASH diet.   2. Type 2 diabetes mellitus without complication, without long-term current use of insulin (HCC) Blood glucose 354 today, decreased from 589 on 05/03/2018. Hgb A1c is 11.4 today,  decreased from 11.9 on 05/03/2018. - POCT glycosylated hemoglobin (Hb A1C) - POCT glucose (manual entry) - POCT urinalysis dipstick  3. Iron deficiency Hgb at 11.9 on 05/03/2018. Continue Iron tablets as prescribed.   4. Elevated blood sugar Blood glucose is 370 today, decreased from 589 on 05/02/2018. 20 units of Novolog given in office today. Blood glucose decreased to 354. She will continue taking Metformin 1000 mg BID, and Glucotrol 10 mg BID as prescribed. We will send Rx for Glucose Monitor and Supplies to pharmacy today.  - insulin lispro (HUMALOG) injection 10 Units  5. Abnormal urinalysis - Urine Culture - Microalbumin/Creatinine Ratio, Urine  6. Proteinuria, unspecified type Urinalysis revealed Protein = 30. We will obtain microalbumin to evaluate further.  - Urine Culture - Microalbumin/Creatinine Ratio, Urine  7. Blurry vision Improving.   8. Fatigue Mild today.   9. Follow up She will follow up in 2 weeks.  Meds ordered this encounter  Medications  . insulin lispro (HUMALOG) injection 10 Units  . insulin lispro (HUMALOG) injection 10 Units  . glucose blood test strip    Sig: Use as instructed    Dispense:  100 each    Refill:  12  . Blood Glucose Monitoring Suppl (ACURA BLOOD GLUCOSE METER) w/Device KIT    Sig: 1 applicator by Does not apply route 4 (four) times daily -  before meals and at bedtime.    Dispense:  1 kit    Refill:  [place],  MSN, FNP-BC Patient [facility] [address] [place], [place] [address] [phone]

## 2018-05-17 LAB — MICROALBUMIN / CREATININE URINE RATIO
Creatinine, Urine: 163.1 mg/dL
Microalb/Creat Ratio: 33.4 mg/g creat — ABNORMAL HIGH (ref 0.0–30.0)
Microalbumin, Urine: 54.4 ug/mL

## 2018-05-19 LAB — URINE CULTURE

## 2018-05-23 ENCOUNTER — Telehealth: Payer: Self-pay

## 2018-05-23 ENCOUNTER — Other Ambulatory Visit: Payer: Self-pay

## 2018-05-23 ENCOUNTER — Other Ambulatory Visit: Payer: Self-pay | Admitting: Family Medicine

## 2018-05-23 DIAGNOSIS — N390 Urinary tract infection, site not specified: Secondary | ICD-10-CM

## 2018-05-23 DIAGNOSIS — R319 Hematuria, unspecified: Secondary | ICD-10-CM

## 2018-05-23 DIAGNOSIS — I10 Essential (primary) hypertension: Secondary | ICD-10-CM

## 2018-05-23 MED ORDER — DOXYCYCLINE HYCLATE 100 MG PO TABS: 100.0000 mg | ORAL_TABLET | Freq: Two times a day (BID) | ORAL | 0 refills | Status: DC

## 2018-05-23 NOTE — Telephone Encounter (Signed)
Tried to contact patient vm was full

## 2018-05-23 NOTE — Progress Notes (Unsigned)
Rx for Doxycycline to pharmacy today.  

## 2018-05-23 NOTE — Telephone Encounter (Signed)
-----   Message from Natalie M Stroud, FNP sent at 05/23/2018  8:34 AM EDT ----- Regarding: "Lab Results" Carrie,   Please inform patient that Urine Culture was positive for infection.    Rx for Doxycycline sent to pharmacy.    Thank you!  

## 2018-05-23 NOTE — Telephone Encounter (Signed)
Tried to contact patient vm full

## 2018-05-23 NOTE — Telephone Encounter (Signed)
-----   Message from [name], FNP sent at 05/23/2018  8:34 AM EDT ----- Regarding: "Lab Results" Grace Daniels,   Please inform patient that Urine Culture was positive for infection.    Rx for Doxycycline sent to pharmacy.    Thank you!

## 2018-05-24 NOTE — Telephone Encounter (Signed)
-----   Message from Natalie M Stroud, FNP sent at 05/23/2018  8:34 AM EDT ----- Regarding: "Lab Results" Carrie,   Please inform patient that Urine Culture was positive for infection.    Rx for Doxycycline sent to pharmacy.    Thank you!  

## 2018-05-24 NOTE — Telephone Encounter (Signed)
Tried to contact patient no answer 

## 2018-05-26 NOTE — Telephone Encounter (Signed)
-----   Message from [name], FNP sent at 05/23/2018  8:34 AM EDT ----- Regarding: "Lab Results" Grace Daniels,   Please inform patient that Urine Culture was positive for infection.    Rx for Doxycycline sent to pharmacy.    Thank you!

## 2018-05-26 NOTE — Telephone Encounter (Signed)
I have attempted to reach patient several times and left message. I have been unsuccessful.

## 2018-05-27 ENCOUNTER — Ambulatory Visit (INDEPENDENT_AMBULATORY_CARE_PROVIDER_SITE_OTHER): Payer: BLUE CROSS/BLUE SHIELD | Admitting: Family Medicine

## 2018-05-27 ENCOUNTER — Encounter: Payer: Self-pay | Admitting: Family Medicine

## 2018-05-27 VITALS — BP 144/90 | HR 90 | Temp 98.0°F | Ht 70.0 in | Wt 387.0 lb

## 2018-05-27 DIAGNOSIS — R739 Hyperglycemia, unspecified: Secondary | ICD-10-CM | POA: Diagnosis not present

## 2018-05-27 DIAGNOSIS — E119 Type 2 diabetes mellitus without complications: Secondary | ICD-10-CM

## 2018-05-27 DIAGNOSIS — Z09 Encounter for follow-up examination after completed treatment for conditions other than malignant neoplasm: Secondary | ICD-10-CM

## 2018-05-27 DIAGNOSIS — I10 Essential (primary) hypertension: Secondary | ICD-10-CM | POA: Diagnosis not present

## 2018-05-27 LAB — POCT URINALYSIS DIP (MANUAL ENTRY)
Blood, UA: NEGATIVE
Glucose, UA: 500 mg/dL — AB
Leukocytes, UA: NEGATIVE
Nitrite, UA: NEGATIVE
Protein Ur, POC: 100 mg/dL — AB
Spec Grav, UA: >=1.03 — AB (ref 1.010–1.025)
Urobilinogen, UA: 1 E.U./dL
pH, UA: 5.5 (ref 5.0–8.0)

## 2018-05-27 LAB — GLUCOSE, POCT (MANUAL RESULT ENTRY): POC Glucose: 248 mg/dl — AB (ref 70–99)

## 2018-05-27 NOTE — Progress Notes (Signed)
Subjective:    Patient ID: Grace Daniels, female    DOB: Apr 13, 1991, 27 y.o.   MRN: [id number]   PCP: Grace Becton, NP  Chief Complaint  Patient presents with  . Follow-up    Grace Daniels has a history of Sleep Apnea, Morbid Obesity, Hypertension, and Hyperlipidemia.   Current Status: Since her last office visit, she is doing well with no complaints.   She does report mild fatigue and mild blurry vision, which she states is improving. She denies increased thirst, headaches, trouble concentrating, frequent urination, fatigue and weight loss.  She denies fevers, chills, recent infections, weight loss, and night sweats.   She has not had any dizziness, and falls.   No chest pain, heart palpitations, cough and shortness of breath reported.   No reports of GI problems such as nausea, vomiting, diarrhea, and constipation. She has no reports of blood in stools, dysuria and hematuria.   She has mild anxiety, and denies suicidal ideations.   She denies pain today.   Past Medical History:  Diagnosis Date  . Hyperlipidemia   . Hypertension   . Morbid obesity ([place])   . Sleep apnea     History reviewed. No pertinent family history.  Social History   Socioeconomic History  . Marital status: Single    Spouse name: Not on file  . Number of children: Not on file  . Years of education: Not on file  . Highest education level: Not on file  Occupational History  . Not on file  Social Needs  . Financial resource strain: Not on file  . Food insecurity:    Worry: Not on file    Inability: Not on file  . Transportation needs:    Medical: Not on file    Non-medical: Not on file  Tobacco Use  . Smoking status: Never Smoker  . Smokeless tobacco: Never Used  Substance and Sexual Activity  . Alcohol use: No  . Drug use: No  . Sexual activity: Never  Lifestyle  . Physical activity:    Days per week: Not on file    Minutes per session: Not on file  . Stress: Not on file   Relationships  . Social connections:    Talks on phone: Not on file    Gets together: Not on file    Attends religious service: Not on file    Active member of club or organization: Not on file    Attends meetings of clubs or organizations: Not on file    Relationship status: Not on file  . Intimate partner violence:    Fear of current or ex partner: Not on file    Emotionally abused: Not on file    Physically abused: Not on file    Forced sexual activity: Not on file  Other Topics Concern  . Not on file  Social History Narrative  . Not on file    History reviewed. No pertinent surgical history.    There is no immunization history on file for this patient.  Current Meds  Medication Sig  . albuterol (PROVENTIL HFA;VENTOLIN HFA) 108 (90 Base) MCG/ACT inhaler Inhale 1-2 puffs into the lungs every 6 (six) hours as needed for wheezing or shortness of breath.  . Blood Glucose Monitoring Suppl (ACURA BLOOD GLUCOSE METER) w/Device KIT 1 applicator by Does not apply route 4 (four) times daily -  before meals and at bedtime.  . cetirizine (ZYRTEC ALLERGY) 10 MG tablet Take 1 tablet (10 mg  total) by mouth daily.  Marland Kitchen doxycycline (VIBRA-TABS) 100 MG tablet Take 1 tablet (100 mg total) by mouth 2 (two) times daily.  . ferrous sulfate 325 (65 FE) MG tablet Take 1 tablet (325 mg total) by mouth 2 (two) times daily with a meal.  . glipiZIDE (GLUCOTROL) 10 MG tablet Take 1 tablet (10 mg total) by mouth 2 (two) times daily.  Marland Kitchen glucose blood test strip Use as instructed  . metFORMIN (GLUCOPHAGE) 1000 MG tablet Take 1 tablet (1,000 mg total) by mouth 2 (two) times daily with a meal. (Patient taking differently: Take 500 mg by mouth 2 (two) times daily with a meal. )  . triamterene-hydrochlorothiazide (DYAZIDE) 37.5-25 MG capsule Take 1 each (1 capsule total) by mouth daily.    No Known Allergies  BP (!) 144/90 (BP Location: Left Wrist, Patient Position: Sitting, Cuff Size: Large)   Pulse 90    Temp 98 F (36.7 C) (Oral)   Ht 5' 10" (1.778 m)   Wt (!) 387 lb (175.5 kg)   LMP 05/11/2018   SpO2 95%   BMI 55.53 kg/m   Review of Systems  Constitutional: Negative.   HENT: Negative.   Eyes: Positive for visual disturbance (blurry vision---improving).  Respiratory: Negative.   Cardiovascular: Negative.   Gastrointestinal: Positive for abdominal distention (Obese).  Endocrine: Negative.   Genitourinary: Negative.   Skin: Negative.   Allergic/Immunologic: Negative.   Neurological: Negative.   Hematological: Negative.   Psychiatric/Behavioral: Negative.    Objective:   Physical Exam  Constitutional: She is oriented to person, place, and time. She appears well-developed and well-nourished.  HENT:  Head: Normocephalic and atraumatic.  Right Ear: External ear normal.  Left Ear: External ear normal.  Nose: Nose normal.  Mouth/Throat: Oropharynx is clear and moist.  Eyes: Pupils are equal, round, and reactive to light. Conjunctivae and EOM are normal.  Neck: Normal range of motion. Neck supple.  Cardiovascular: Normal rate, regular rhythm, normal heart sounds and intact distal pulses.  Pulmonary/Chest: Effort normal and breath sounds normal.  Abdominal: Soft. Bowel sounds are normal. She exhibits distension (Obese).  Musculoskeletal: Normal range of motion. She exhibits edema (right ankle sweling).  Neurological: She is alert and oriented to person, place, and time.  Skin: Skin is warm and dry. Capillary refill takes less than 2 seconds. There is erythema.  Psychiatric: She has a normal mood and affect. Her behavior is normal. Judgment and thought content normal.  Nursing note and vitals reviewed. l Assessment & Plan:   1. Type 2 diabetes mellitus without complication, without long-term current use of insulin (HCC) Blood glucose is improved at 248 from 392 on 05/16/2018. We will continue to monitor.  - POCT urinalysis dipstick - POCT glucose (manual entry)  2. Essential  hypertension Blood pressure is 144/90 today. She will continue Triamterene/HCTZ as prescribed.  3. Hyperglycemia Glucose level is at 248 today. She will continue diet as directed. She will continue Glucotrol and Metformin as prescribe.   4. Morbid obesity due to excess calories (HCC) BMI is 55.53. She will continue to decrease high sodium intake, excessive alcohol intake, increase potassium intake, smoking cessation, and increase physical activity with at least 30 minutes of cardio daily. Follow DASH diet.   5. Follow up Follow up in 1 month.   No orders of the defined types were placed in this encounter.   Grace Becton,  MSN, FNP-BC Patient [facility] [address] [place],  [address] [phone]

## 2018-06-07 ENCOUNTER — Telehealth: Payer: Self-pay

## 2018-06-07 NOTE — Telephone Encounter (Signed)
Patient states that she has finished her medication for the urine infection and is still having some burning and itching. Patient wants to know if more medication can be sent in or does she need to come back in office leave another sample. Please advise

## 2018-06-08 NOTE — Telephone Encounter (Signed)
Please have patient to come in for a sample. Do a UA and urine culture.

## 2018-06-13 NOTE — Telephone Encounter (Signed)
Tried to contact patient this morning to see what the follow was done while I was out of office. There was no answer and vm is full

## 2018-06-13 NOTE — Telephone Encounter (Signed)
Unable to contact patient and vm was full

## 2018-06-24 ENCOUNTER — Encounter: Payer: Self-pay | Admitting: Family Medicine

## 2018-06-24 ENCOUNTER — Ambulatory Visit (INDEPENDENT_AMBULATORY_CARE_PROVIDER_SITE_OTHER): Payer: BLUE CROSS/BLUE SHIELD | Admitting: Family Medicine

## 2018-06-24 VITALS — BP 140/96 | HR 80 | Temp 98.4°F | Ht 70.0 in | Wt 384.0 lb

## 2018-06-24 DIAGNOSIS — I10 Essential (primary) hypertension: Secondary | ICD-10-CM

## 2018-06-24 DIAGNOSIS — E119 Type 2 diabetes mellitus without complications: Secondary | ICD-10-CM

## 2018-06-24 DIAGNOSIS — R739 Hyperglycemia, unspecified: Secondary | ICD-10-CM | POA: Diagnosis not present

## 2018-06-24 DIAGNOSIS — Z09 Encounter for follow-up examination after completed treatment for conditions other than malignant neoplasm: Secondary | ICD-10-CM

## 2018-06-24 LAB — POCT URINALYSIS DIP (MANUAL ENTRY)
Bilirubin, UA: NEGATIVE
Blood, UA: NEGATIVE
Glucose, UA: NEGATIVE mg/dL
Ketones, POC UA: NEGATIVE mg/dL
Leukocytes, UA: NEGATIVE
Nitrite, UA: NEGATIVE
Protein Ur, POC: NEGATIVE mg/dL
Spec Grav, UA: 1.02 (ref 1.010–1.025)
Urobilinogen, UA: 1 E.U./dL
pH, UA: 5.5 (ref 5.0–8.0)

## 2018-06-24 LAB — POCT GLYCOSYLATED HEMOGLOBIN (HGB A1C): Hemoglobin A1C: 10.6 % — AB (ref 4.0–5.6)

## 2018-06-24 LAB — GLUCOSE, POCT (MANUAL RESULT ENTRY): POC Glucose: 198 mg/dl — AB (ref 70–99)

## 2018-06-24 NOTE — Progress Notes (Signed)
Follow Up  Subjective:    Patient ID: Grace Daniels, female    DOB: 1990-11-28, 28 y.o.   MRN: [id number]  HPI  [name] has a past medical history of Sleep Apnea, Morbid Obesity, Hypertension, and Hyperlipidemia. She is here today for follow up on Chronic Diseases.    Current Status: Since her last office visit, she is doing well with no complaints. She denies fevers, chills, fatigue, recent infections, weight loss, and night sweats. She has not had any headaches, visual changes, dizziness, and falls. No chest pain, heart palpitations, cough and shortness of breath reported. No reports of GI problems such as nausea, vomiting, diarrhea, and constipation. She has no reports of blood in stools, dysuria and hematuria. No depression or anxiety, and denies suicidal ideations, homicidal ideations, or auditory hallucinations. She denies pain today.     Past Medical History:  Diagnosis Date  . Hyperlipidemia   . Hypertension   . Morbid obesity ([place])   . Sleep apnea     History reviewed. No pertinent family history.  Social History   Socioeconomic History  . Marital status: Single    Spouse name: Not on file  . Number of children: Not on file  . Years of education: Not on file  . Highest education level: Not on file  Occupational History  . Not on file  Social Needs  . Financial resource strain: Not on file  . Food insecurity:    Worry: Not on file    Inability: Not on file  . Transportation needs:    Medical: Not on file    Non-medical: Not on file  Tobacco Use  . Smoking status: Never Smoker  . Smokeless tobacco: Never Used  Substance and Sexual Activity  . Alcohol use: No  . Drug use: No  . Sexual activity: Never  Lifestyle  . Physical activity:    Days per week: Not on file    Minutes per session: Not on file  . Stress: Not on file  Relationships  . Social connections:    Talks on phone: Not on file    Gets together: Not on file    Attends religious service: Not  on file    Active member of club or organization: Not on file    Attends meetings of clubs or organizations: Not on file    Relationship status: Not on file  . Intimate partner violence:    Fear of current or ex partner: Not on file    Emotionally abused: Not on file    Physically abused: Not on file    Forced sexual activity: Not on file  Other Topics Concern  . Not on file  Social History Narrative  . Not on file    History reviewed. No pertinent surgical history.    There is no immunization history on file for this patient.  Current Meds  Medication Sig  . albuterol (PROVENTIL HFA;VENTOLIN HFA) 108 (90 Base) MCG/ACT inhaler Inhale 1-2 puffs into the lungs every 6 (six) hours as needed for wheezing or shortness of breath.  . Blood Glucose Monitoring Suppl (ACURA BLOOD GLUCOSE METER) w/Device KIT 1 applicator by Does not apply route 4 (four) times daily -  before meals and at bedtime.  Marland Kitchen glipiZIDE (GLUCOTROL) 10 MG tablet Take 1 tablet (10 mg total) by mouth 2 (two) times daily.  Marland Kitchen glucose blood test strip Use as instructed  . metFORMIN (GLUCOPHAGE) 1000 MG tablet Take 1 tablet (1,000 mg total) by mouth  2 (two) times daily with a meal. (Patient taking differently: Take 500 mg by mouth 2 (two) times daily with a meal. )  . triamterene-hydrochlorothiazide (DYAZIDE) 37.5-25 MG capsule Take 1 each (1 capsule total) by mouth daily.    No Known Allergies  BP (!) 140/96   Pulse 80   Temp 98.4 F (36.9 C) (Oral)   Ht '5\' 10"'$  (1.778 m)   Wt (!) 384 lb (174.2 kg)   LMP 05/11/2018   SpO2 100%   BMI 55.10 kg/m   Review of Systems  Constitutional: Negative.   HENT: Negative.   Eyes: Negative.   Respiratory: Negative.   Cardiovascular: Negative.   Gastrointestinal: Positive for abdominal distention.  Endocrine: Negative.   Genitourinary: Negative.   Musculoskeletal: Negative.   Skin: Negative.   Allergic/Immunologic: Negative.   Neurological: Negative.   Hematological:  Negative.   Psychiatric/Behavioral: Negative.    Objective:   Physical Exam  Constitutional: She is oriented to person, place, and time. She appears well-developed and well-nourished.  HENT:  Head: Normocephalic and atraumatic.  Right Ear: External ear normal.  Left Ear: External ear normal.  Nose: Nose normal.  Mouth/Throat: Oropharynx is clear and moist.  Eyes: Pupils are equal, round, and reactive to light. Conjunctivae and EOM are normal.  Neck: Normal range of motion. Neck supple.  Cardiovascular: Normal rate, regular rhythm, normal heart sounds and intact distal pulses.  Pulmonary/Chest: Effort normal and breath sounds normal.  Abdominal: Soft. Bowel sounds are normal. She exhibits distension.  Musculoskeletal: Normal range of motion.  Neurological: She is alert and oriented to person, place, and time.  Skin: Skin is warm and dry. Capillary refill takes less than 2 seconds.  Psychiatric: She has a normal mood and affect. Her behavior is normal. Judgment and thought content normal.  Nursing note and vitals reviewed.  Assessment & Plan:   1. Type 2 diabetes mellitus without complication, without long-term current use of insulin (HCC) Improved. Hgb A1c is at 10.6 today, from 11.4 on 05/16/2018.  She will continue to decrease foods/beverages high in sugars and carbs and follow Heart Healthy or DASH diet. Increase physical activity to at least 30 minutes cardio exercise daily.   - POCT urinalysis dipstick - POCT glucose (manual entry) - POCT glycosylated hemoglobin (Hb A1C)  2. Essential hypertension Blood pressure is 134/90 today. She will continue Triamterene/HCTZ as prescribed. Monitor.  3. Morbid obesity due to excess calories (HCC) BMI >50-59 in adult-Goal BMI is <25. Encouraged efforts to reduce weight include engaging in physical activity as tolerated with goal of 150 minutes per week. Improve dietary choices and eat a meal regimen consistent with a Mediterranean or DASH  diet. Reduce simple carbohydrates. Do not skip meals and eat healthy snacks throughout the day to avoid over-eating at dinner. Set a goal weight loss that is achievable for you.  4. Hyperglycemia Blood glucose elevated at 198 today. Continue to monitor.   5. Follow up She will follow up in 3 months.   No orders of the defined types were placed in this encounter.  Kathe Becton,  MSN, FNP-C Patient [facility] [address] [place], [place] [address] [phone]

## 2018-09-22 ENCOUNTER — Telehealth: Payer: Self-pay

## 2018-09-22 NOTE — Telephone Encounter (Signed)
Patient vm was full and I was unable to leave message regarding appointment.

## 2018-09-26 ENCOUNTER — Ambulatory Visit: Payer: BLUE CROSS/BLUE SHIELD | Admitting: Family Medicine

## 2018-10-26 ENCOUNTER — Encounter: Payer: Self-pay | Admitting: Family Medicine

## 2018-10-26 ENCOUNTER — Ambulatory Visit (INDEPENDENT_AMBULATORY_CARE_PROVIDER_SITE_OTHER): Payer: BLUE CROSS/BLUE SHIELD | Admitting: Family Medicine

## 2018-10-26 VITALS — BP 144/90 | HR 84 | Temp 97.8°F | Ht 70.0 in | Wt 396.0 lb

## 2018-10-26 DIAGNOSIS — R7309 Other abnormal glucose: Secondary | ICD-10-CM | POA: Diagnosis not present

## 2018-10-26 DIAGNOSIS — R8290 Unspecified abnormal findings in urine: Secondary | ICD-10-CM

## 2018-10-26 DIAGNOSIS — N390 Urinary tract infection, site not specified: Secondary | ICD-10-CM

## 2018-10-26 DIAGNOSIS — I10 Essential (primary) hypertension: Secondary | ICD-10-CM

## 2018-10-26 DIAGNOSIS — R319 Hematuria, unspecified: Secondary | ICD-10-CM

## 2018-10-26 DIAGNOSIS — E119 Type 2 diabetes mellitus without complications: Secondary | ICD-10-CM | POA: Diagnosis not present

## 2018-10-26 DIAGNOSIS — Z09 Encounter for follow-up examination after completed treatment for conditions other than malignant neoplasm: Secondary | ICD-10-CM

## 2018-10-26 LAB — POCT URINALYSIS DIP (MANUAL ENTRY)
Bilirubin, UA: NEGATIVE
Glucose, UA: 500 mg/dL — AB
Nitrite, UA: NEGATIVE
Protein Ur, POC: 30 mg/dL — AB
Spec Grav, UA: 1.025 (ref 1.010–1.025)
Urobilinogen, UA: 2 E.U./dL — AB
pH, UA: 6 (ref 5.0–8.0)

## 2018-10-26 LAB — POCT GLYCOSYLATED HEMOGLOBIN (HGB A1C): Hemoglobin A1C: 9.9 % — AB (ref 4.0–5.6)

## 2018-10-26 LAB — GLUCOSE, POCT (MANUAL RESULT ENTRY)
POC Glucose: 324 mg/dl — AB (ref 70–99)
POC Glucose: 293 mg/dl — AB (ref 70–99)

## 2018-10-26 MED ORDER — INSULIN LISPRO 100 UNIT/ML ~~LOC~~ SOLN
10.0000 [IU] | Freq: Once | SUBCUTANEOUS | Status: AC
Start: 2018-10-26 — End: 2018-10-26
  Administered 2018-10-26: 10 [IU] via SUBCUTANEOUS

## 2018-10-26 MED ORDER — SULFAMETHOXAZOLE-TRIMETHOPRIM 800-160 MG PO TABS: 1.0000 | ORAL_TABLET | Freq: Two times a day (BID) | ORAL | 0 refills | Status: DC

## 2018-10-26 NOTE — Progress Notes (Signed)
Follow Up  Subjective:    Patient ID: Grace Daniels, female    DOB: 03/25/1991, 27 y.o.   MRN: [id number]  Chief Complaint  Patient presents with  . Follow-up    chronic condition    HPI  Grace Daniels is a 27 year old female with a past medical history of Diabetes, Sleep Apnea, Morbid Obesity, Hypertension, and Hyperlipidemia. She is here today for assessment and follow up of chronic diseases.   Current Status: Since her last office visit, she is doing well with no complaints. She states that she had fatigue, frequent urination. She denies blurred vision, weight loss, and poor wound healing. She denies visual changes, chest pain, cough, shortness of breath, heart palpitations, and falls. She has occasionally headaches and dizziness with position changes. Denies severe headaches, confusion, seizures, double vision, and blurred vision, nausea and vomiting.  She denies fevers, chills, fatigue, recent infections, weight loss, and night sweats. No reports of GI problems such as diarrhea, and constipation. She has no reports of blood in stools, dysuria and hematuria. No depression or anxiety reported. She denies pain today.    Review of Systems  Constitutional: Negative.   HENT: Negative.   Eyes: Negative.   Respiratory: Negative.   Cardiovascular: Negative.   Gastrointestinal: Negative.   Endocrine: Positive for polydipsia, polyphagia and polyuria.  Genitourinary: Negative.   Musculoskeletal: Negative.   Skin: Negative.   Allergic/Immunologic: Negative.   Neurological: Positive for dizziness and headaches.  Hematological: Negative.   Psychiatric/Behavioral: Negative.    Objective:   Physical Exam  Constitutional: She is oriented to person, place, and time. She appears well-developed and well-nourished.  HENT:  Head: Normocephalic and atraumatic.  Right Ear: External ear normal.  Eyes: Pupils are equal, round, and reactive to light. Conjunctivae and EOM are normal.  Neck: Normal  range of motion. Neck supple.  Cardiovascular: Normal rate, regular rhythm, normal heart sounds and intact distal pulses.  Pulmonary/Chest: Effort normal and breath sounds normal.  Abdominal: Soft. Bowel sounds are normal.  Musculoskeletal: Normal range of motion.  Neurological: She is alert and oriented to person, place, and time.  Skin: Skin is warm and dry.  Psychiatric: She has a normal mood and affect. Her behavior is normal. Judgment and thought content normal.  Nursing note and vitals reviewed.  Assessment & Plan:   1. Type 2 diabetes mellitus without complication, without long-term current use of insulin (HCC) Improved. Hgb A1c is decreased at 9.9 today from 11.9 on 05/02/2018. Continue Metformin, Insulin, and Glucotrol as prescribed. She will continue to decrease foods/beverages high in sugars and carbs and follow Heart Healthy or DASH diet. Increase physical activity to at least 30 minutes cardio exercise daily.  - POCT glycosylated hemoglobin (Hb A1C) - POCT urinalysis dipstick - POCT glucose (manual entry)  2. Elevated glucose Blood glucose is 324 today, decreased from 392 on 05/16/2018. Continue meds as prescribed.  - insulin lispro (HUMALOG) injection 10 Units  3. Abnormal urine - Urine Culture  4. Essential hypertension Blood pressure is elevated at 144/90 today. Continue Triamterene-HCTZ as prescribed. She will continue to decrease high sodium intake, excessive alcohol intake, increase potassium intake, smoking cessation, and increase physical activity of at least 30 minutes of cardio activity daily. She will continue to follow Heart Healthy or DASH diet.  5. Morbid obesity due to excess calories (HCC) Body mass index is 56.82 kg/m. Goal BMI  is < 27. Encouraged efforts to reduce weight include engaging in physical activity as tolerated  with goal of 150 minutes per week. Improve dietary choices and eat a meal regimen consistent with a Mediterranean or DASH diet. Reduce  simple carbohydrates. Do not skip meals and eat healthy snacks throughout the day to avoid over-eating at dinner. Set a goal weight loss that is achievable for you.  6. UTI  7. Follow up We will follow up in 3 months.   Meds ordered this encounter  Medications  . insulin lispro (HUMALOG) injection 10 Units   Raliegh Ip,  MSN, FNP-C Patient [facility] [address] [place], [place] [address] [phone]

## 2018-10-28 LAB — URINE CULTURE

## 2018-11-01 ENCOUNTER — Encounter (HOSPITAL_COMMUNITY): Payer: Self-pay

## 2018-11-01 ENCOUNTER — Ambulatory Visit (HOSPITAL_COMMUNITY)
Admission: EM | Admit: 2018-11-01 | Discharge: 2018-11-01 | Disposition: A | Discharge status: Home / Self Care | Payer: BLUE CROSS/BLUE SHIELD | Attending: Family Medicine | Admitting: Family Medicine

## 2018-11-01 DIAGNOSIS — L03011 Cellulitis of right finger: Secondary | ICD-10-CM | POA: Insufficient documentation

## 2018-11-01 MED ORDER — SULFAMETHOXAZOLE-TRIMETHOPRIM 800-160 MG PO TABS: 1.0000 | ORAL_TABLET | Freq: Two times a day (BID) | ORAL | 0 refills | Status: DC

## 2018-11-01 NOTE — ED Triage Notes (Signed)
Pt complains of a right hand /middle finger ingrown nail that she notice on Saturday.  Pt finger is swelling.

## 2018-11-01 NOTE — ED Provider Notes (Signed)
[facility]   [id number] 11/01/18 Arrival Time: 1415  ASSESSMENT & PLAN:  1. Paronychia of right middle finger    Incision and Drainage Procedure Note  Anesthesia: Pain Ease cold spray  Procedure Details  The procedure, risks and complications have been discussed in detail (including, but not limited to pain and bleeding) with the patient.  The skin induration was prepped and draped in the usual fashion. After adequate local anesthesia, I&D with a #11 blade was performed on the R 3rd distal finger at nailfold. Purulent drainage: present.  EBL: minimal  Drains: none  Condition: Tolerated procedure well  Complications: none. Bandage applied.  Discussed simple wound care instructions.  Meds ordered this encounter  Medications  . sulfamethoxazole-trimethoprim (BACTRIM DS,SEPTRA DS) 800-160 MG tablet    Sig: Take 1 tablet by mouth 2 (two) times daily.    Dispense:  14 tablet    Refill:  0   Finish all antibiotics. OTC analgesics as needed.  Reviewed expectations re: course of current medical issues. Questions answered. Outlined signs and symptoms indicating need for more acute intervention. Patient verbalized understanding. After Visit Summary given.   SUBJECTIVE:  Grace Daniels is a 27 y.o. female who presents with a possible infection around the nailfold of her R 3rd finger. Onset gradual, approximately several days ago. Moderate pain. No drainage or bleeding. Afebrile. No home/OTC treatment. No injury to this finger reported.  ROS: As per HPI.  OBJECTIVE:  Vitals:   11/01/18 1433  BP: (!) 144/81  Pulse: 96  Resp: 16  Temp: 98 F (36.7 C)  TempSrc: Oral  SpO2: 99%    General appearance: alert; no distress Skin: erythema and warmth around nailfold of her R 3rd finger; small area of fluctuance; tender to touch; no active drainage or bleeding CV: normal capillary refill of R 3rd finger FROM of R 3rd finger with normal distal sensation; no signs  of trauma Psychological: alert and cooperative; normal mood and affect  No Known Allergies  Past Medical History:  Diagnosis Date  . Diabetes (HCC)   . Hyperlipidemia   . Hypertension   . Morbid obesity (HCC)   . Sleep apnea    Social History   Socioeconomic History  . Marital status: Single    Spouse name: Not on file  . Number of children: Not on file  . Years of education: Not on file  . Highest education level: Not on file  Occupational History  . Not on file  Social Needs  . Financial resource strain: Not on file  . Food insecurity:    Worry: Not on file    Inability: Not on file  . Transportation needs:    Medical: Not on file    Non-medical: Not on file  Tobacco Use  . Smoking status: Never Smoker  . Smokeless tobacco: Never Used  Substance and Sexual Activity  . Alcohol use: No  . Drug use: No  . Sexual activity: Never  Lifestyle  . Physical activity:    Days per week: Not on file    Minutes per session: Not on file  . Stress: Not on file  Relationships  . Social connections:    Talks on phone: Not on file    Gets together: Not on file    Attends religious service: Not on file    Active member of club or organization: Not on file    Attends meetings of clubs or organizations: Not on file    Relationship status:  Not on file  Other Topics Concern  . Not on file  Social History Narrative  . Not on file    History reviewed. No pertinent surgical history.         Mardella LaymanHagler, Cobey Raineri, MD 11/02/18 1115

## 2018-11-07 ENCOUNTER — Telehealth: Payer: Self-pay

## 2018-11-07 DIAGNOSIS — N390 Urinary tract infection, site not specified: Secondary | ICD-10-CM

## 2018-11-07 MED ORDER — PENICILLIN V POTASSIUM 500 MG PO TABS: 500.0000 mg | ORAL_TABLET | Freq: Two times a day (BID) | ORAL | 0 refills | Status: AC

## 2018-11-07 MED ORDER — PHENAZOPYRIDINE HCL 200 MG PO TABS: 200.0000 mg | ORAL_TABLET | Freq: Three times a day (TID) | ORAL | 0 refills | Status: AC | PRN

## 2018-11-07 NOTE — Telephone Encounter (Signed)
Will send in penicillin VK  and pyridium for patient to the pharmacy on file.

## 2018-11-07 NOTE — Telephone Encounter (Signed)
Patient was put on Bactrim for a UTI and states that she has finished antibiotic and feels worse. Patient saw Dorene Grebeatalie and would like to have a different medication sent.

## 2018-11-07 NOTE — Telephone Encounter (Signed)
Patient notified

## 2018-11-10 ENCOUNTER — Other Ambulatory Visit: Payer: Self-pay | Admitting: Family Medicine

## 2018-11-10 DIAGNOSIS — I10 Essential (primary) hypertension: Secondary | ICD-10-CM

## 2018-11-25 ENCOUNTER — Emergency Department (HOSPITAL_COMMUNITY): Payer: BLUE CROSS/BLUE SHIELD

## 2018-11-25 ENCOUNTER — Emergency Department (HOSPITAL_COMMUNITY)
Admission: EM | Admit: 2018-11-25 | Discharge: 2018-11-26 | Disposition: A | Discharge status: Home / Self Care | Payer: BLUE CROSS/BLUE SHIELD | Attending: Emergency Medicine | Admitting: Emergency Medicine

## 2018-11-25 ENCOUNTER — Other Ambulatory Visit: Payer: Self-pay | Admitting: Family Medicine

## 2018-11-25 ENCOUNTER — Encounter (HOSPITAL_COMMUNITY): Payer: Self-pay | Admitting: Emergency Medicine

## 2018-11-25 ENCOUNTER — Other Ambulatory Visit: Payer: Self-pay

## 2018-11-25 DIAGNOSIS — I10 Essential (primary) hypertension: Secondary | ICD-10-CM | POA: Insufficient documentation

## 2018-11-25 DIAGNOSIS — Z7984 Long term (current) use of oral hypoglycemic drugs: Secondary | ICD-10-CM | POA: Diagnosis not present

## 2018-11-25 DIAGNOSIS — E119 Type 2 diabetes mellitus without complications: Secondary | ICD-10-CM | POA: Diagnosis not present

## 2018-11-25 DIAGNOSIS — R000 Tachycardia, unspecified: Secondary | ICD-10-CM | POA: Diagnosis not present

## 2018-11-25 DIAGNOSIS — R079 Chest pain, unspecified: Secondary | ICD-10-CM | POA: Diagnosis not present

## 2018-11-25 DIAGNOSIS — R0789 Other chest pain: Secondary | ICD-10-CM | POA: Insufficient documentation

## 2018-11-25 LAB — POCT I-STAT TROPONIN I: Troponin i, poc: 0 ng/mL (ref 0.00–0.08)

## 2018-11-25 LAB — BASIC METABOLIC PANEL
Anion gap: 9 (ref 5–15)
BUN: 8 mg/dL (ref 6–20)
CO2: 22 mmol/L (ref 22–32)
Calcium: 8.8 mg/dL — ABNORMAL LOW (ref 8.9–10.3)
Chloride: 107 mmol/L (ref 98–111)
Creatinine, Ser: 0.65 mg/dL (ref 0.44–1.00)
GFR calc Af Amer: >60 mL/min (ref >60)
GFR calc non Af Amer: >60 mL/min (ref >60)
Glucose, Bld: 260 mg/dL — ABNORMAL HIGH (ref 70–99)
POTASSIUM: 3.6 mmol/L (ref 3.5–5.1)
Sodium: 138 mmol/L (ref 135–145)

## 2018-11-25 LAB — CBC
HCT: 34.6 % — ABNORMAL LOW (ref 36.0–46.0)
Hemoglobin: 10.2 g/dL — ABNORMAL LOW (ref 12.0–15.0)
MCH: 19.3 pg — ABNORMAL LOW (ref 26.0–34.0)
MCHC: 29.5 g/dL — ABNORMAL LOW (ref 30.0–36.0)
MCV: 65.5 fL — ABNORMAL LOW (ref 80.0–100.0)
Platelets: 351 10*3/uL (ref 150–400)
RBC: 5.28 MIL/uL — ABNORMAL HIGH (ref 3.87–5.11)
RDW: 19 % — ABNORMAL HIGH (ref 11.5–15.5)
WBC: 9.1 10*3/uL (ref 4.0–10.5)
nRBC: 0 % (ref 0.0–0.2)

## 2018-11-25 LAB — I-STAT BETA HCG BLOOD, ED (NOT ORDERABLE): I-stat hCG, quantitative: <5 m[IU]/mL (ref <5)

## 2018-11-25 MED ORDER — SODIUM CHLORIDE 0.9 % IV BOLUS
1000.0000 mL | Freq: Once | INTRAVENOUS | Status: AC
  Administered 2018-11-26: 1000 mL via INTRAVENOUS

## 2018-11-25 NOTE — ED Triage Notes (Signed)
Reports chest pain that began this morning upon waking up.  Worsens on inspiration.  Denies nausea. Ambulatory to triage in NAD

## 2018-11-25 NOTE — ED Provider Notes (Signed)
English DEPT Provider Note   CSN: [id number] Arrival date & time: 11/25/18  1746     History   Chief Complaint Chief Complaint  Patient presents with  . Chest Pain    HPI Grace Daniels is a 28 y.o. female.  The history is provided by the patient and medical records. No language interpreter was used.  Chest Pain  Pain location:  L chest and L lateral chest Pain quality: aching and sharp   Pain radiates to:  L shoulder Pain severity:  Moderate Onset quality:  Gradual Duration:  1 day Timing:  Constant Progression:  Improving Chronicity:  Recurrent Context: movement, raising an arm and at rest   Relieved by:  Nothing Worsened by:  Nothing Ineffective treatments:  None tried Associated symptoms: cough   Associated symptoms: no abdominal pain, no altered mental status, no anorexia, no anxiety, no back pain, no diaphoresis, no dizziness, no fatigue, no fever, no headache, no lower extremity edema, no nausea, no near-syncope, no palpitations, no shortness of breath, no syncope and no vomiting   Risk factors: diabetes mellitus, hypertension and obesity   Risk factors: no immobilization, not female and no prior DVT/PE     Past Medical History:  Diagnosis Date  . Diabetes ([place])   . Hyperlipidemia   . Hypertension   . Morbid obesity ([place])   . Sleep apnea     Patient Active Problem List   Diagnosis Date Noted  . Obstructive sleep apnea 08/02/2017  . Morbid obesity due to excess calories ([place]) 08/02/2017  . HTN (hypertension) 04/26/2017  . Abnormal uterine bleeding (AUB) 03/30/2016    History reviewed. No pertinent surgical history.   OB History    Gravida  0   Para      Term      Preterm      AB      Living  0     SAB      TAB      Ectopic      Multiple      Live Births               Home Medications    Prior to Admission medications   Medication Sig Start Date End Date Taking? Authorizing Provider    Blood Glucose Monitoring Suppl (ACURA BLOOD GLUCOSE METER) w/Device KIT 1 applicator by Does not apply route 4 (four) times daily -  before meals and at bedtime. 05/16/18   Azzie Glatter, FNP  ferrous sulfate 325 (65 FE) MG tablet Take 1 tablet (325 mg total) by mouth 2 (two) times daily with a meal. Patient not taking: Reported on 10/26/2018 04/30/17   Scot Jun, FNP  glipiZIDE (GLUCOTROL) 10 MG tablet Take 1 tablet (10 mg total) by mouth 2 (two) times daily. 05/03/18 05/03/19  Azzie Glatter, FNP  glucose blood test strip Use as instructed 05/16/18   Azzie Glatter, FNP  metFORMIN (GLUCOPHAGE) 1000 MG tablet Take 1 tablet (1,000 mg total) by mouth 2 (two) times daily with a meal. Patient taking differently: Take 500 mg by mouth 2 (two) times daily with a meal.  05/03/18 05/03/19  Azzie Glatter, FNP  triamterene-hydrochlorothiazide (DYAZIDE) 37.5-25 MG capsule Take 1 each (1 capsule total) by mouth daily. 11/01/17   Scot Jun, FNP    Family History History reviewed. No pertinent family history.  Social History Social History   Tobacco Use  . Smoking status: Never Smoker  .  Smokeless tobacco: Never Used  Substance Use Topics  . Alcohol use: No  . Drug use: No     Allergies   Patient has no known allergies.   Review of Systems Review of Systems  Constitutional: Negative for chills, diaphoresis, fatigue and fever.  HENT: Negative for congestion.   Respiratory: Positive for cough. Negative for choking, chest tightness, shortness of breath and stridor.   Cardiovascular: Positive for chest pain. Negative for palpitations, leg swelling, syncope and near-syncope.  Gastrointestinal: Negative for abdominal pain, anorexia, constipation, diarrhea, nausea and vomiting.  Genitourinary: Negative for flank pain.  Musculoskeletal: Negative for back pain, neck pain and neck stiffness.  Skin: Negative for rash and wound.  Neurological: Negative for dizziness,  light-headedness and headaches.  Psychiatric/Behavioral: Negative for agitation.  All other systems reviewed and are negative.    Physical Exam Updated Vital Signs BP (!) 142/69 (BP Location: Right Arm)   Pulse (!) 105   Temp 98.3 F (36.8 C) (Oral)   Resp 19   Ht '5\' 10"'$  (1.778 m)   Wt (!) 174.6 kg   LMP 08/30/2018 (Within Weeks)   SpO2 100%   BMI 55.24 kg/m   Physical Exam Vitals signs and nursing note reviewed.  Constitutional:      General: She is not in acute distress.    Appearance: She is well-developed. She is not ill-appearing, toxic-appearing or diaphoretic.  HENT:     Head: Normocephalic and atraumatic.  Eyes:     Extraocular Movements: Extraocular movements intact.     Conjunctiva/sclera: Conjunctivae normal.     Pupils: Pupils are equal, round, and reactive to light.  Neck:     Musculoskeletal: Normal range of motion and neck supple.  Cardiovascular:     Rate and Rhythm: Regular rhythm. Tachycardia present.     Heart sounds: No murmur.  Pulmonary:     Effort: Pulmonary effort is normal. No respiratory distress.     Breath sounds: Normal breath sounds. No decreased breath sounds, wheezing, rhonchi or rales.  Chest:     Chest wall: Tenderness present.  Abdominal:     Palpations: Abdomen is soft.     Tenderness: There is no abdominal tenderness.  Musculoskeletal: Normal range of motion.     Right lower leg: She exhibits no tenderness. No edema.     Left lower leg: She exhibits no tenderness. No edema.  Skin:    General: Skin is warm and dry.     Capillary Refill: Capillary refill takes less than 2 seconds.  Neurological:     General: No focal deficit present.     Mental Status: She is alert.  Psychiatric:        Mood and Affect: Mood is not anxious.        Behavior: Behavior is not agitated.      ED Treatments / Results  Labs (all labs ordered are listed, but only abnormal results are displayed) Labs Reviewed  BASIC METABOLIC PANEL - Abnormal;  Notable for the following components:      Result Value   Glucose, Bld 260 (*)    Calcium 8.8 (*)    All other components within normal limits  CBC - Abnormal; Notable for the following components:   RBC 5.28 (*)    Hemoglobin 10.2 (*)    HCT 34.6 (*)    MCV 65.5 (*)    MCH 19.3 (*)    MCHC 29.5 (*)    RDW 19.0 (*)    All other components  within normal limits  D-DIMER, QUANTITATIVE (NOT AT [facility])  I-STAT TROPONIN, ED  I-STAT BETA HCG BLOOD, ED ([facility], [facility], [facility] ONLY)  POCT I-STAT TROPONIN I  I-STAT BETA HCG BLOOD, ED (NOT ORDERABLE)  I-STAT TROPONIN, ED  POCT I-STAT TROPONIN I    EKG None  ED ECG REPORT   Date: 11/25/2018  Rate: 105  Rhythm: sinus tachycardia  QRS Axis: normal  Intervals: normal  ST/T Wave abnormalities: normal  Conduction Disutrbances:none  Narrative Interpretation: Patient has S1/Q3 pattern similar to prior.   Old EKG Reviewed: unchanged  I have personally reviewed the EKG tracing and agree with the computerized printout as noted.   Radiology Dg Chest 2 View  Result Date: 11/25/2018 CLINICAL DATA:  Chest pain beginning this morning. EXAM: CHEST - 2 VIEW COMPARISON:  12/25/2017 FINDINGS: The heart size and mediastinal contours are within normal limits. Both lungs are clear. The visualized skeletal structures are unremarkable. IMPRESSION: No active cardiopulmonary disease. Electronically Signed   By: Ashley Royalty M.D.   On: 11/25/2018 18:28    Procedures Procedures (including critical care time)  Medications Ordered in ED Medications  sodium chloride 0.9 % bolus 1,000 mL (0 mLs Intravenous Stopped 11/26/18 0213)     Initial Impression / Assessment and Plan / ED Course  I have reviewed the triage vital signs and the nursing notes.  Pertinent labs & imaging results that were available during my care of the patient were reviewed by me and considered in my medical decision making (see chart for details).     Grace Daniels is a 28 y.o. female with a  past medical history significant for hypertension, hyperlipidemia, diabetes, sleep apnea, and obesity who presents with chest pain.  Patient reports that she had chest pain starting this morning.  She reports it has been there all day.  It is extremely pleuritic and sharp.  It is worsened with any deep breathing.  She reports no significant shortness of breath.  She says that she has had a dry cough and some congestion for the last week.  She reports has had some mild dysuria and recently had a UTI.  She reports she had some diarrhea but no nausea or vomiting.  No reported diaphoresis or syncopal episodes.  No significant palpitations.  She reports the pain is not exertional and it is across her chest.  She reports the pain is also worsened when she moves her left shoulder around.  Slight tenderness going towards her shoulder.  On exam, no murmur.  Lungs are clear.  Chest is tender in the location of the pain.  Pain was reproducible.  Abdomen was nontender.  No significant edema in legs.  Flanks nontender.  EKG showed S1/every 3 pattern similar to prior.  T wave inversion previously had improved.  No STEMI.  Clinically I suspect either pulmonary embolism versus a musculoskeletal pain.  Patient says she does sleep with her left arm above her head and thinks that given the pain with shoulder movement may be related.  Patient will have a delta troponin as well as chest x-ray and d-dimer.  If work-up is reassuring, anticipate discharge.    Final Clinical Impressions(s) / ED Diagnoses   Final diagnoses:  Chest wall pain    ED Discharge Orders         Ordered    ibuprofen (ADVIL,MOTRIN) 800 MG tablet  3 times daily     11/26/18 0141         Clinical Impression: 1.  Chest wall pain     Disposition: Care transferred while awaiting d dimer results. Anticipate diacharge.   This note was prepared with assistance of Systems analyst. Occasional wrong-word or sound-a-like  substitutions may have occurred due to the inherent limitations of voice recognition software.     Tawanna Funk, Gwenyth Allegra, MD 11/26/18 1048

## 2018-11-26 LAB — POCT I-STAT TROPONIN I: Troponin i, poc: 0 ng/mL (ref 0.00–0.08)

## 2018-11-26 LAB — D-DIMER, QUANTITATIVE: D-Dimer, Quant: 0.32 ug/mL-FEU (ref 0.00–0.50)

## 2018-11-26 MED ORDER — IBUPROFEN 800 MG PO TABS: 800.0000 mg | ORAL_TABLET | Freq: Three times a day (TID) | ORAL | 0 refills | Status: DC

## 2018-11-28 ENCOUNTER — Other Ambulatory Visit: Payer: Self-pay | Admitting: Family Medicine

## 2018-11-28 ENCOUNTER — Telehealth: Payer: Self-pay

## 2018-11-28 DIAGNOSIS — N898 Other specified noninflammatory disorders of vagina: Secondary | ICD-10-CM

## 2018-11-28 DIAGNOSIS — N949 Unspecified condition associated with female genital organs and menstrual cycle: Secondary | ICD-10-CM

## 2018-11-28 DIAGNOSIS — I10 Essential (primary) hypertension: Secondary | ICD-10-CM

## 2018-11-28 DIAGNOSIS — Z113 Encounter for screening for infections with a predominantly sexual mode of transmission: Secondary | ICD-10-CM

## 2018-11-28 MED ORDER — TRIAMTERENE-HCTZ 37.5-25 MG PO CAPS: 1.0000 | ORAL_CAPSULE | Freq: Every day | ORAL | 2 refills | Status: DC

## 2018-11-28 NOTE — Telephone Encounter (Signed)
Patient has completed that antibiotic and states that she is still having some burning and itching. Patient wants to know if she needs to come back in to do a sample.

## 2018-11-29 ENCOUNTER — Telehealth: Payer: Self-pay

## 2018-11-29 DIAGNOSIS — Z113 Encounter for screening for infections with a predominantly sexual mode of transmission: Secondary | ICD-10-CM | POA: Diagnosis not present

## 2018-11-29 DIAGNOSIS — N949 Unspecified condition associated with female genital organs and menstrual cycle: Secondary | ICD-10-CM

## 2018-11-29 DIAGNOSIS — N898 Other specified noninflammatory disorders of vagina: Secondary | ICD-10-CM | POA: Diagnosis not present

## 2018-11-29 NOTE — Telephone Encounter (Signed)
Patient notified to stop by office to to give another sample

## 2018-12-02 ENCOUNTER — Other Ambulatory Visit: Payer: Self-pay | Admitting: Family Medicine

## 2018-12-02 DIAGNOSIS — B379 Candidiasis, unspecified: Secondary | ICD-10-CM

## 2018-12-02 LAB — NUSWAB VAGINITIS PLUS (VG+)
Candida albicans, NAA: POSITIVE — AB
Candida glabrata, NAA: NEGATIVE
Chlamydia trachomatis, NAA: NEGATIVE
Neisseria gonorrhoeae, NAA: NEGATIVE
Trich vag by NAA: NEGATIVE

## 2018-12-02 MED ORDER — FLUCONAZOLE 150 MG PO TABS: ORAL_TABLET | ORAL | 0 refills | Status: DC

## 2018-12-02 NOTE — Telephone Encounter (Signed)
Patient calling about swab results

## 2018-12-26 ENCOUNTER — Telehealth: Payer: Self-pay

## 2018-12-27 NOTE — Telephone Encounter (Signed)
Tried to contact patient no answer 

## 2018-12-27 NOTE — Telephone Encounter (Signed)
Patient is still having the vaginal irration and the smell. She wants to know could have sugar levels be causing the irration. She took the pills that were prescribed and they help a little but not much. Patient is unable to check her sugar because she could afford the machine.

## 2018-12-28 ENCOUNTER — Other Ambulatory Visit: Payer: Self-pay | Admitting: Family Medicine

## 2018-12-28 DIAGNOSIS — B379 Candidiasis, unspecified: Secondary | ICD-10-CM

## 2018-12-28 MED ORDER — FLUCONAZOLE 150 MG PO TABS: ORAL_TABLET | ORAL | 0 refills | Status: DC

## 2018-12-29 NOTE — Telephone Encounter (Signed)
Patient notified

## 2019-01-16 DIAGNOSIS — R87610 Atypical squamous cells of undetermined significance on cytologic smear of cervix (ASC-US): Secondary | ICD-10-CM | POA: Diagnosis not present

## 2019-01-16 DIAGNOSIS — Z01419 Encounter for gynecological examination (general) (routine) without abnormal findings: Secondary | ICD-10-CM | POA: Diagnosis not present

## 2019-01-16 DIAGNOSIS — Z113 Encounter for screening for infections with a predominantly sexual mode of transmission: Secondary | ICD-10-CM | POA: Diagnosis not present

## 2019-01-16 DIAGNOSIS — Z1151 Encounter for screening for human papillomavirus (HPV): Secondary | ICD-10-CM | POA: Diagnosis not present

## 2019-01-20 ENCOUNTER — Ambulatory Visit: Payer: BLUE CROSS/BLUE SHIELD | Admitting: Family Medicine

## 2019-01-27 ENCOUNTER — Ambulatory Visit (INDEPENDENT_AMBULATORY_CARE_PROVIDER_SITE_OTHER): Payer: BLUE CROSS/BLUE SHIELD | Admitting: Family Medicine

## 2019-01-27 ENCOUNTER — Encounter: Payer: Self-pay | Admitting: Family Medicine

## 2019-01-27 ENCOUNTER — Other Ambulatory Visit: Payer: Self-pay

## 2019-01-27 VITALS — BP 146/90 | HR 100 | Temp 97.6°F | Ht 70.0 in | Wt 382.0 lb

## 2019-01-27 DIAGNOSIS — E119 Type 2 diabetes mellitus without complications: Secondary | ICD-10-CM | POA: Diagnosis not present

## 2019-01-27 DIAGNOSIS — R7309 Other abnormal glucose: Secondary | ICD-10-CM

## 2019-01-27 LAB — POCT URINALYSIS DIP (MANUAL ENTRY)
Bilirubin, UA: NEGATIVE
Glucose, UA: 500 mg/dL — AB
Leukocytes, UA: NEGATIVE
Nitrite, UA: NEGATIVE
Protein Ur, POC: NEGATIVE mg/dL
Spec Grav, UA: 1.02 (ref 1.010–1.025)
Urobilinogen, UA: 0.2 E.U./dL
pH, UA: 5.5 (ref 5.0–8.0)

## 2019-01-27 LAB — POCT GLYCOSYLATED HEMOGLOBIN (HGB A1C): Hemoglobin A1C: 11.7 % — AB (ref 4.0–5.6)

## 2019-01-27 LAB — GLUCOSE, POCT (MANUAL RESULT ENTRY)
POC Glucose: 350 mg/dl — AB (ref 70–99)
POC Glucose: 290 mg/dl — AB (ref 70–99)

## 2019-01-27 MED ORDER — DAPAGLIFLOZIN PROPANEDIOL 10 MG PO TABS: 10.0000 mg | ORAL_TABLET | Freq: Every day | ORAL | 2 refills | Status: DC

## 2019-01-27 MED ORDER — INSULIN LISPRO 100 UNIT/ML ~~LOC~~ SOLN
20.0000 [IU] | Freq: Once | SUBCUTANEOUS | Status: AC
  Administered 2019-01-27: 20 [IU] via SUBCUTANEOUS

## 2019-01-27 MED ORDER — BLOOD GLUCOSE MONITOR KIT: PACK | 0 refills | Status: DC

## 2019-01-27 NOTE — Progress Notes (Signed)
[facility]   Progress Note: General Provider: Mike Gip, FNP  SUBJECTIVE:   Grace Daniels is a 28 y.o. female who  has a past medical history of Diabetes (HCC), Hyperlipidemia, Hypertension, Morbid obesity (HCC), and Sleep apnea.. Patient presents today for Follow-up (Chronic condition ) Patient of N. Bradly Chris, NP. She presents today for DM2 follow up. Her 24 hour diet recall includes: 1 large bowl of fruit loops and whole milk, bag of ritz snack size cheese bites, cookout chicken sandwich, corndog, fries, and large sweet tea. She is not exercising and does not follow a low carb diet. She states that she is not compliant with the second dose of metformin and glipizide due to stomach upset.    Review of Systems  Constitutional: Negative.   HENT: Negative.   Eyes: Negative.   Respiratory: Negative.   Cardiovascular: Negative.   Gastrointestinal: Negative.   Genitourinary: Negative.   Musculoskeletal: Negative.   Skin: Negative.   Neurological: Negative.   Psychiatric/Behavioral: Negative.      OBJECTIVE: BP (!) 146/90 (BP Location: Right Arm, Patient Position: Sitting, Cuff Size: Large)   Pulse 100   Temp 97.6 F (36.4 C) (Oral)   Ht 5\' 10"  (1.778 m)   Wt (!) 382 lb (173.3 kg)   LMP  (LMP Unknown)   SpO2 98%   BMI 54.81 kg/m   Wt Readings from Last 3 Encounters:  01/27/19 (!) 382 lb (173.3 kg)  11/25/18 (!) 385 lb (174.6 kg)  10/26/18 (!) 396 lb (179.6 kg)     Physical Exam Vitals signs and nursing note reviewed.  Constitutional:      General: She is not in acute distress.    Appearance: She is well-developed. She is obese.  HENT:     Head: Normocephalic and atraumatic.  Eyes:     Conjunctiva/sclera: Conjunctivae normal.     Pupils: Pupils are equal, round, and reactive to light.  Neck:     Musculoskeletal: Normal range of motion.  Cardiovascular:     Rate and Rhythm: Normal rate and regular rhythm.   Heart sounds: Normal heart sounds.  Pulmonary:     Effort: Pulmonary effort is normal. No respiratory distress.     Breath sounds: Normal breath sounds.  Abdominal:     General: Bowel sounds are normal. There is no distension.     Palpations: Abdomen is soft.  Musculoskeletal: Normal range of motion.  Skin:    General: Skin is warm and dry.  Neurological:     Mental Status: She is alert and oriented to person, place, and time.  Psychiatric:        Behavior: Behavior normal.        Thought Content: Thought content normal.     ASSESSMENT/PLAN:  1. Type 2 diabetes mellitus without complication, without long-term current use of insulin (HCC) Patient with elevated CBG. 20 units humalog given in the office today. Small ketones in urine. Patient declined IVF today at the day hospital. Started patient on farxiga. Continue with glipizide and metformin. She is advised to exercise for 150 minutes a week. We discussed nutrition and limiting carb intake. Discussed a carb modified meal plan and made a menu. She is to follw in 4 weeks with Jolene Schimke, NP - POCT glycosylated hemoglobin (Hb A1C) - POCT urinalysis dipstick - POCT glucose (manual entry) - insulin lispro (HUMALOG) injection 20 Units - dapagliflozin propanediol (FARXIGA) 10 MG TABS tablet; Take 10 mg by mouth  daily.  Dispense: 30 tablet; Refill: 2     Return in about 4 weeks (around 02/24/2019) for New Diabetes medications.    The patient was given clear instructions to go to ER or return to medical center if symptoms do not improve, worsen or new problems develop. The patient verbalized understanding and agreed with plan of care.   [name]. Riley Lam, FNP-BC [facility] [address] [place], [place] [address] [phone]

## 2019-01-27 NOTE — Patient Instructions (Addendum)
1. Take your medications twice a day.  2. Move your body- walk, swimming, stretching 3. Diet- cut down on your carbs- rice, pasta, potatoes, bread, cereal.  4. Continue drinking water. If you need a sweetener, use stevia or splenda.   Breakfast:  "Break your fast" 2 slices of [place] bacon 1 boiled egg 2 six ounce glasses of water Coffee/tea (Splenda/stevia) Snack:  High protein (10 almonds, cottage cheese, or greek yogurt)   Lunch:  Small garden salad (oil & vinegar)  can of tuna (or chicken breast, lamb chop, salmon, etc)   Snack:  Almonds ( or protein shake)   Dinner:  [employer], vegetable, and complex carbohydrate (brown rice, kenoa, sweet potato) Health Maintenance, Female Adopting a healthy lifestyle and getting preventive care can go a long way to promote health and wellness. Talk with your health care provider about what schedule of regular examinations is right for you. This is a good chance for you to check in with your provider about disease prevention and staying healthy. In between checkups, there are plenty of things you can do on your own. Experts have done a lot of research about which lifestyle changes and preventive measures are most likely to keep you healthy. Ask your health care provider for more information. Weight and diet Eat a healthy diet  Be sure to include plenty of vegetables, fruits, low-fat dairy products, and lean protein.  Do not eat a lot of foods high in solid fats, added sugars, or salt.  Get regular exercise. This is one of the most important things you can do for your health. ? Most adults should exercise for at least 150 minutes each week. The exercise should increase your heart rate and make you sweat (moderate-intensity exercise). ? Most adults should also do strengthening exercises at least twice a week. This is in addition to the moderate-intensity exercise. Maintain a healthy weight  Body mass index (BMI) is a  measurement that can be used to identify possible weight problems. It estimates body fat based on height and weight. Your health care provider can help determine your BMI and help you achieve or maintain a healthy weight.  For females 17 years of age and older: ? A BMI below 18.5 is considered underweight. ? A BMI of 18.5 to 24.9 is normal. ? A BMI of 25 to 29.9 is considered overweight. ? A BMI of 30 and above is considered obese. Watch levels of cholesterol and blood lipids  You should start having your blood tested for lipids and cholesterol at 29 years of age, then have this test every 5 years.  You may need to have your cholesterol levels checked more often if: ? Your lipid or cholesterol levels are high. ? You are older than 29 years of age. ? You are at high risk for heart disease. Cancer screening Lung Cancer  Lung cancer screening is recommended for adults 78-53 years old who are at high risk for lung cancer because of a history of smoking.  A yearly low-dose CT scan of the lungs is recommended for people who: ? Currently smoke. ? Have quit within the past 15 years. ? Have at least a 30-pack-year history of smoking. A pack year is smoking an average of one pack of cigarettes a day for 1 year.  Yearly screening should continue until it has been 15 years since you quit.  Yearly screening should stop if you develop a health problem that would prevent you from having lung  cancer treatment. Breast Cancer  Practice breast self-awareness. This means understanding how your breasts normally appear and feel.  It also means doing regular breast self-exams. Let your health care provider know about any changes, no matter how small.  If you are in your 20s or 30s, you should have a clinical breast exam (CBE) by a health care provider every 1-3 years as part of a regular health exam.  If you are 91 or older, have a CBE every year. Also consider having a breast X-ray (mammogram) every  year.  If you have a family history of breast cancer, talk to your health care provider about genetic screening.  If you are at high risk for breast cancer, talk to your health care provider about having an MRI and a mammogram every year.  Breast cancer gene (BRCA) assessment is recommended for women who have family members with BRCA-related cancers. BRCA-related cancers include: ? Breast. ? Ovarian. ? Tubal. ? Peritoneal cancers.  Results of the assessment will determine the need for genetic counseling and BRCA1 and BRCA2 testing. Cervical Cancer Your health care provider may recommend that you be screened regularly for cancer of the pelvic organs (ovaries, uterus, and vagina). This screening involves a pelvic examination, including checking for microscopic changes to the surface of your cervix (Pap test). You may be encouraged to have this screening done every 3 years, beginning at age 96.  For women ages 74-65, health care providers may recommend pelvic exams and Pap testing every 3 years, or they may recommend the Pap and pelvic exam, combined with testing for human papilloma virus (HPV), every 5 years. Some types of HPV increase your risk of cervical cancer. Testing for HPV may also be done on women of any age with unclear Pap test results.  Other health care providers may not recommend any screening for nonpregnant women who are considered low risk for pelvic cancer and who do not have symptoms. Ask your health care provider if a screening pelvic exam is right for you.  If you have had past treatment for cervical cancer or a condition that could lead to cancer, you need Pap tests and screening for cancer for at least 20 years after your treatment. If Pap tests have been discontinued, your risk factors (such as having a new sexual partner) need to be reassessed to determine if screening should resume. Some women have medical problems that increase the chance of getting cervical cancer. In  these cases, your health care provider may recommend more frequent screening and Pap tests. Colorectal Cancer  This type of cancer can be detected and often prevented.  Routine colorectal cancer screening usually begins at 29 years of age and continues through 28 years of age.  Your health care provider may recommend screening at an earlier age if you have risk factors for colon cancer.  Your health care provider may also recommend using home test kits to check for hidden blood in the stool.  A small camera at the end of a tube can be used to examine your colon directly (sigmoidoscopy or colonoscopy). This is done to check for the earliest forms of colorectal cancer.  Routine screening usually begins at age 28.  Direct examination of the colon should be repeated every 5-10 years through 29 years of age. However, you may need to be screened more often if early forms of precancerous polyps or small growths are found. Skin Cancer  Check your skin from head to toe regularly.  Tell your  health care provider about any new moles or changes in moles, especially if there is a change in a mole's shape or color.  Also tell your health care provider if you have a mole that is larger than the size of a pencil eraser.  Always use sunscreen. Apply sunscreen liberally and repeatedly throughout the day.  Protect yourself by wearing long sleeves, pants, a wide-brimmed hat, and sunglasses whenever you are outside. Heart disease, diabetes, and high blood pressure  High blood pressure causes heart disease and increases the risk of stroke. High blood pressure is more likely to develop in: ? People who have blood pressure in the high end of the normal range (130-139/85-89 mm Hg). ? People who are overweight or obese. ? People who are African American.  If you are 60-69 years of age, have your blood pressure checked every 3-5 years. If you are 44 years of age or older, have your blood pressure checked  every year. You should have your blood pressure measured twice-once when you are at a hospital or clinic, and once when you are not at a hospital or clinic. Record the average of the two measurements. To check your blood pressure when you are not at a hospital or clinic, you can use: ? An automated blood pressure machine at a pharmacy. ? A home blood pressure monitor.  If you are between 7 years and 49 years old, ask your health care provider if you should take aspirin to prevent strokes.  Have regular diabetes screenings. This involves taking a blood sample to check your fasting blood sugar level. ? If you are at a normal weight and have a low risk for diabetes, have this test once every three years after 29 years of age. ? If you are overweight and have a high risk for diabetes, consider being tested at a younger age or more often. Preventing infection Hepatitis B  If you have a higher risk for hepatitis B, you should be screened for this virus. You are considered at high risk for hepatitis B if: ? You were born in a country where hepatitis B is common. Ask your health care provider which countries are considered high risk. ? Your parents were born in a high-risk country, and you have not been immunized against hepatitis B (hepatitis B vaccine). ? You have HIV or AIDS. ? You use needles to inject street drugs. ? You live with someone who has hepatitis B. ? You have had sex with someone who has hepatitis B. ? You get hemodialysis treatment. ? You take certain medicines for conditions, including cancer, organ transplantation, and autoimmune conditions. Hepatitis C  Blood testing is recommended for: ? Everyone born from [birth year] through 1966. ? Anyone with known risk factors for hepatitis C. Sexually transmitted infections (STIs)  You should be screened for sexually transmitted infections (STIs) including gonorrhea and chlamydia if: ? You are sexually active and are younger than 29 years of  age. ? You are older than 29 years of age and your health care provider tells you that you are at risk for this type of infection. ? Your sexual activity has changed since you were last screened and you are at an increased risk for chlamydia or gonorrhea. Ask your health care provider if you are at risk.  If you do not have HIV, but are at risk, it may be recommended that you take a prescription medicine daily to prevent HIV infection. This is called pre-exposure prophylaxis (PrEP). You are  considered at risk if: ? You are sexually active and do not regularly use condoms or know the HIV status of your partner(s). ? You take drugs by injection. ? You are sexually active with a partner who has HIV. Talk with your health care provider about whether you are at high risk of being infected with HIV. If you choose to begin PrEP, you should first be tested for HIV. You should then be tested every 3 months for as long as you are taking PrEP. Pregnancy  If you are premenopausal and you may become pregnant, ask your health care provider about preconception counseling.  If you may become pregnant, take 400 to 800 micrograms (mcg) of folic acid every day.  If you want to prevent pregnancy, talk to your health care provider about birth control (contraception). Osteoporosis and menopause  Osteoporosis is a disease in which the bones lose minerals and strength with aging. This can result in serious bone fractures. Your risk for osteoporosis can be identified using a bone density scan.  If you are 67 years of age or older, or if you are at risk for osteoporosis and fractures, ask your health care provider if you should be screened.  Ask your health care provider whether you should take a calcium or vitamin D supplement to lower your risk for osteoporosis.  Menopause may have certain physical symptoms and risks.  Hormone replacement therapy may reduce some of these symptoms and risks. Talk to your health  care provider about whether hormone replacement therapy is right for you. Follow these instructions at home:  Schedule regular health, dental, and eye exams.  Stay current with your immunizations.  Do not use any tobacco products including cigarettes, chewing tobacco, or electronic cigarettes.  If you are pregnant, do not drink alcohol.  If you are breastfeeding, limit how much and how often you drink alcohol.  Limit alcohol intake to no more than 1 drink per day for nonpregnant women. One drink equals 12 ounces of beer, 5 ounces of wine, or 1 ounces of hard liquor.  Do not use street drugs.  Do not share needles.  Ask your health care provider for help if you need support or information about quitting drugs.  Tell your health care provider if you often feel depressed.  Tell your health care provider if you have ever been abused or do not feel safe at home. This information is not intended to replace advice given to you by your health care provider. Make sure you discuss any questions you have with your health care provider. Document Released: 05/18/2011 Document Revised: 04/09/2016 Document Reviewed: 08/06/2015 Elsevier Interactive Patient Education  2019 [employer].  Diabetes Mellitus and Nutrition, Adult When you have diabetes (diabetes mellitus), it is very important to have healthy eating habits because your blood sugar (glucose) levels are greatly affected by what you eat and drink. Eating healthy foods in the appropriate amounts, at about the same times every day, can help you:  Control your blood glucose.  Lower your risk of heart disease.  Improve your blood pressure.  Reach or maintain a healthy weight. Every person with diabetes is different, and each person has different needs for a meal plan. Your health care provider may recommend that you work with a diet and nutrition specialist (dietitian) to make a meal plan that is best for you. Your meal plan may vary  depending on factors such as:  The calories you need.  The medicines you take.  Your  weight.  Your blood glucose, blood pressure, and cholesterol levels.  Your activity level.  Other health conditions you have, such as heart or kidney disease. How do carbohydrates affect me? Carbohydrates, also called carbs, affect your blood glucose level more than any other type of food. Eating carbs naturally raises the amount of glucose in your blood. Carb counting is a method for keeping track of how many carbs you eat. Counting carbs is important to keep your blood glucose at a healthy level, especially if you use insulin or take certain oral diabetes medicines. It is important to know how many carbs you can safely have in each meal. This is different for every person. Your dietitian can help you calculate how many carbs you should have at each meal and for each snack. Foods that contain carbs include:  Bread, cereal, rice, pasta, and crackers.  Potatoes and corn.  Peas, beans, and lentils.  Milk and yogurt.  Fruit and juice.  Desserts, such as cakes, cookies, ice cream, and candy. How does alcohol affect me? Alcohol can cause a sudden decrease in blood glucose (hypoglycemia), especially if you use insulin or take certain oral diabetes medicines. Hypoglycemia can be a life-threatening condition. Symptoms of hypoglycemia (sleepiness, dizziness, and confusion) are similar to symptoms of having too much alcohol. If your health care provider says that alcohol is safe for you, follow these guidelines:  Limit alcohol intake to no more than 1 drink per day for nonpregnant women and 2 drinks per day for men. One drink equals 12 oz of beer, 5 oz of wine, or 1 oz of hard liquor.  Do not drink on an empty stomach.  Keep yourself hydrated with water, diet soda, or unsweetened iced tea.  Keep in mind that regular soda, juice, and other mixers may contain a lot of sugar and must be counted as  carbs. What are tips for following this plan?  Reading food labels  Start by checking the serving size on the "Nutrition Facts" label of packaged foods and drinks. The amount of calories, carbs, fats, and other nutrients listed on the label is based on one serving of the item. Many items contain more than one serving per package.  Check the total grams (g) of carbs in one serving. You can calculate the number of servings of carbs in one serving by dividing the total carbs by 15. For example, if a food has 30 g of total carbs, it would be equal to 2 servings of carbs.  Check the number of grams (g) of saturated and trans fats in one serving. Choose foods that have low or no amount of these fats.  Check the number of milligrams (mg) of salt (sodium) in one serving. Most people should limit total sodium intake to less than 2,300 mg per day.  Always check the nutrition information of foods labeled as "low-fat" or "nonfat". These foods may be higher in added sugar or refined carbs and should be avoided.  Talk to your dietitian to identify your daily goals for nutrients listed on the label. Shopping  Avoid buying canned, premade, or processed foods. These foods tend to be high in fat, sodium, and added sugar.  Shop around the outside edge of the grocery store. This includes fresh fruits and vegetables, bulk grains, fresh meats, and fresh dairy. Cooking  Use low-heat cooking methods, such as baking, instead of high-heat cooking methods like deep frying.  Cook using healthy oils, such as olive, canola, or sunflower oil.  Avoid cooking with butter, cream, or high-fat meats. Meal planning  Eat meals and snacks regularly, preferably at the same times every day. Avoid going long periods of time without eating.  Eat foods high in fiber, such as fresh fruits, vegetables, beans, and whole grains. Talk to your dietitian about how many servings of carbs you can eat at each meal.  Eat 4-6 ounces (oz)  of lean protein each day, such as lean meat, chicken, fish, eggs, or tofu. One oz of lean protein is equal to: ? 1 oz of meat, chicken, or fish. ? 1 egg. ?  cup of tofu.  Eat some foods each day that contain healthy fats, such as avocado, nuts, seeds, and fish. Lifestyle  Check your blood glucose regularly.  Exercise regularly as told by your health care provider. This may include: ? 150 minutes of moderate-intensity or vigorous-intensity exercise each week. This could be brisk walking, biking, or water aerobics. ? Stretching and doing strength exercises, such as yoga or weightlifting, at least 2 times a week.  Take medicines as told by your health care provider.  Do not use any products that contain nicotine or tobacco, such as cigarettes and e-cigarettes. If you need help quitting, ask your health care provider.  Work with a Social worker or diabetes educator to identify strategies to manage stress and any emotional and social challenges. Questions to ask a health care provider  Do I need to meet with a diabetes educator?  Do I need to meet with a dietitian?  What number can I call if I have questions?  When are the best times to check my blood glucose? Where to find more information:  American Diabetes Association: diabetes.org  Academy of Nutrition and Dietetics: www.eatright.[employer] of Diabetes and Digestive and Kidney Diseases (NIH): [employer] Summary  A healthy meal plan will help you control your blood glucose and maintain a healthy lifestyle.  Working with a diet and nutrition specialist (dietitian) can help you make a meal plan that is best for you.  Keep in mind that carbohydrates (carbs) and alcohol have immediate effects on your blood glucose levels. It is important to count carbs and to use alcohol carefully. This information is not intended to replace advice given to you by your health care provider. Make sure you discuss any questions you  have with your health care provider. Document Released: 07/30/2005 Document Revised: 06/02/2017 Document Reviewed: 12/07/2016 Elsevier Interactive Patient Education  2019 [employer].

## 2019-01-27 NOTE — Progress Notes (Signed)
poct

## 2019-02-05 ENCOUNTER — Inpatient Hospital Stay (HOSPITAL_COMMUNITY)
Admission: AD | Admit: 2019-02-05 | Discharge: 2019-02-05 | Disposition: A | Discharge status: Home / Self Care | Payer: BLUE CROSS/BLUE SHIELD | Attending: Obstetrics and Gynecology | Admitting: Obstetrics and Gynecology

## 2019-02-05 ENCOUNTER — Other Ambulatory Visit: Payer: Self-pay

## 2019-02-05 DIAGNOSIS — R300 Dysuria: Secondary | ICD-10-CM

## 2019-02-05 DIAGNOSIS — Z789 Other specified health status: Secondary | ICD-10-CM | POA: Diagnosis not present

## 2019-02-05 LAB — POCT PREGNANCY, URINE: Preg Test, Ur: NEGATIVE

## 2019-02-05 NOTE — MAU Note (Signed)
Grace Daniels is a 28 y.o. here in MAU reporting: burning when she pees and that her urine smells foul. Reports increased frequency and urgency also.  Onset of complaint: a few days  Pain score: 0/10  Vitals:   02/05/19 0911  BP: (!) 152/99  Pulse: 79  Resp: 18  Temp: 97.7 F (36.5 C)  SpO2: 98%      Lab orders placed from triage: UPT

## 2019-02-05 NOTE — MAU Note (Signed)
Urine in lab 

## 2019-02-05 NOTE — MAU Provider Note (Signed)
First Provider Initiated Contact with Patient 02/05/19 0915      S [name] is a 28 y.o. G0P0 non-pregnant female who presents to MAU today with complaint of dysuria. Patient has PMHX of HTN and DM. She states that she has had sx for 5 days, but she has not called her PCP.   O BP (!) 152/99 (BP Location: Right Arm)   Pulse 79   Temp 97.7 F (36.5 C) (Oral)   Resp 18   Ht 5\' 10"  (1.778 m)   Wt (!) 173.5 kg   LMP  (LMP Unknown)   SpO2 98%   BMI 54.87 kg/m  Physical Exam  Nursing note and vitals reviewed. Constitutional: She is oriented to person, place, and time. She appears well-developed and well-nourished.  HENT:  Head: Normocephalic.  Cardiovascular: Normal rate.  Respiratory: Effort normal.  Neurological: She is alert and oriented to person, place, and time.  Psychiatric: She has a normal mood and affect.     Results for orders placed or performed during the hospital encounter of 02/05/19 (from the past 24 hour(s))  Pregnancy, urine POC     Status: None   Collection Time: 02/05/19  9:07 AM  Result Value Ref Range   Preg Test, Ur NEGATIVE NEGATIVE    A Non pregnant female Medical screening exam complete   P Discharge from MAU in stable condition Patient given the option of transfer to [facility] for further evaluation or seek care in outpatient facility of choice Patient declines to go to the ED. She will plan to FU with her PCP.  List of options for follow-up given  Warning signs for worsening condition that would warrant emergency follow-up discussed Patient may return to MAU as needed for pregnancy related complaints  Thressa Sheller DNP, CNM  02/05/19  9:26 AM

## 2019-02-05 NOTE — Discharge Instructions (Signed)

## 2019-02-06 ENCOUNTER — Telehealth: Payer: Self-pay

## 2019-02-06 ENCOUNTER — Other Ambulatory Visit: Payer: Self-pay | Admitting: Family Medicine

## 2019-02-06 DIAGNOSIS — B379 Candidiasis, unspecified: Secondary | ICD-10-CM

## 2019-02-06 MED ORDER — FLUCONAZOLE 150 MG PO TABS: 150.0000 mg | ORAL_TABLET | Freq: Once | ORAL | 0 refills | Status: AC

## 2019-02-06 NOTE — Telephone Encounter (Signed)
Patient would like to have something sent into the pharmacy for UTI. She is having burning, itching, and aching. Patient states that she was placed on a new medication for her sugar and thinks that it's from medication .

## 2019-02-07 NOTE — Telephone Encounter (Signed)
Patient notified

## 2019-02-24 ENCOUNTER — Ambulatory Visit: Payer: BLUE CROSS/BLUE SHIELD | Admitting: Family Medicine

## 2019-03-03 ENCOUNTER — Ambulatory Visit: Payer: BLUE CROSS/BLUE SHIELD | Admitting: Family Medicine

## 2019-03-06 ENCOUNTER — Telehealth: Payer: Self-pay

## 2019-03-06 MED ORDER — GLIPIZIDE 10 MG PO TABS: 10.0000 mg | ORAL_TABLET | Freq: Two times a day (BID) | ORAL | 3 refills | Status: DC

## 2019-03-06 MED ORDER — METFORMIN HCL 1000 MG PO TABS: 1000.0000 mg | ORAL_TABLET | Freq: Two times a day (BID) | ORAL | 3 refills | Status: DC

## 2019-03-06 NOTE — Telephone Encounter (Signed)
Medication sent to pharmacy  

## 2019-03-07 ENCOUNTER — Ambulatory Visit (INDEPENDENT_AMBULATORY_CARE_PROVIDER_SITE_OTHER): Payer: BLUE CROSS/BLUE SHIELD | Admitting: Family Medicine

## 2019-03-07 ENCOUNTER — Encounter: Payer: Self-pay | Admitting: Family Medicine

## 2019-03-07 ENCOUNTER — Other Ambulatory Visit: Payer: Self-pay

## 2019-03-07 VITALS — BP 136/90 | HR 74 | Temp 98.0°F | Ht 70.0 in | Wt 374.0 lb

## 2019-03-07 DIAGNOSIS — R7309 Other abnormal glucose: Secondary | ICD-10-CM

## 2019-03-07 DIAGNOSIS — E119 Type 2 diabetes mellitus without complications: Secondary | ICD-10-CM | POA: Diagnosis not present

## 2019-03-07 DIAGNOSIS — I10 Essential (primary) hypertension: Secondary | ICD-10-CM

## 2019-03-07 DIAGNOSIS — Z09 Encounter for follow-up examination after completed treatment for conditions other than malignant neoplasm: Secondary | ICD-10-CM

## 2019-03-07 DIAGNOSIS — E66813 Obesity, class 3: Secondary | ICD-10-CM

## 2019-03-07 DIAGNOSIS — Z6841 Body Mass Index (BMI) 40.0 and over, adult: Secondary | ICD-10-CM

## 2019-03-07 DIAGNOSIS — R634 Abnormal weight loss: Secondary | ICD-10-CM

## 2019-03-07 LAB — POCT URINALYSIS DIP (MANUAL ENTRY)
Bilirubin, UA: NEGATIVE
Glucose, UA: 250 mg/dL — AB
Leukocytes, UA: NEGATIVE
Nitrite, UA: NEGATIVE
Protein Ur, POC: NEGATIVE mg/dL
Spec Grav, UA: 1.01 (ref 1.010–1.025)
Urobilinogen, UA: 0.2 E.U./dL
pH, UA: 6 (ref 5.0–8.0)

## 2019-03-07 LAB — POCT GLYCOSYLATED HEMOGLOBIN (HGB A1C): Hemoglobin A1C: 10.9 % — AB (ref 4.0–5.6)

## 2019-03-07 LAB — GLUCOSE, POCT (MANUAL RESULT ENTRY): POC Glucose: 186 mg/dl — AB (ref 70–99)

## 2019-03-07 NOTE — Progress Notes (Signed)
Patient [facility]   Established Patient Office Visit  Subjective:  Patient ID: Grace Daniels, female    DOB: [birth year]  Age: 28 y.o. MRN: [id number]  CC:  Chief Complaint  Patient presents with  . Follow-up    chronic condition     HPI Grace Daniels is a 28 year old female who presents for follow up today.   Past Medical History:  Diagnosis Date  . Diabetes ([place])   . Hyperlipidemia   . Hypertension   . Morbid obesity ([place])   . Sleep apnea    Current Status: Since her last office visit, she is doing well with no complaints. She continues [place] as prescribed, but does not like side effects. She denies fatigue, frequent urination, blurred vision, excessive hunger, excessive thirst, weight gain, weight loss, and poor wound healing. She denies visual changes, chest pain, cough, shortness of breath, heart palpitations, and falls. She has occasional headaches and dizziness with position changes. Denies severe headaches, confusion, seizures, double vision, and blurred vision, nausea and vomiting. She has had a significant weight loss since her last office visit.  She denies fevers, chills, recent infections, and night sweats. No reports of GI problems such as nausea, vomiting, diarrhea, and constipation. She has no reports of blood in stools, dysuria and hematuria. No depression or anxiety reported. She denies pain today.   No past surgical history on file.  No family history on file.  Social History   Socioeconomic History  . Marital status: Single    Spouse name: Not on file  . Number of children: Not on file  . Years of education: Not on file  . Highest education level: Not on file  Occupational History  . Not on file  Social Needs  . Financial resource strain: Not on file  . Food insecurity:    Worry: Not on file    Inability: Not on file  . Transportation needs:    Medical: Not on file    Non-medical: Not on file   Tobacco Use  . Smoking status: Never Smoker  . Smokeless tobacco: Never Used  Substance and Sexual Activity  . Alcohol use: No  . Drug use: No  . Sexual activity: Never  Lifestyle  . Physical activity:    Days per week: Not on file    Minutes per session: Not on file  . Stress: Not on file  Relationships  . Social connections:    Talks on phone: Not on file    Gets together: Not on file    Attends religious service: Not on file    Active member of club or organization: Not on file    Attends meetings of clubs or organizations: Not on file    Relationship status: Not on file  . Intimate partner violence:    Fear of current or ex partner: Not on file    Emotionally abused: Not on file    Physically abused: Not on file    Forced sexual activity: Not on file  Other Topics Concern  . Not on file  Social History Narrative  . Not on file    Outpatient Medications Prior to Visit  Medication Sig Dispense Refill  . acetaminophen (TYLENOL) 500 MG tablet Take 500 mg by mouth every 6 (six) hours as needed for moderate pain.    . blood glucose meter kit and supplies KIT Dispense based on patient and insurance preference. Use up to four  times daily as directed. (FOR ICD-9 250.00, 250.01). 1 each 0  . Blood Glucose Monitoring Suppl (ACURA BLOOD GLUCOSE METER) w/Device KIT 1 applicator by Does not apply route 4 (four) times daily -  before meals and at bedtime. 1 kit 1  . dapagliflozin propanediol (FARXIGA) 10 MG TABS tablet Take 10 mg by mouth daily. 30 tablet 2  . glipiZIDE (GLUCOTROL) 10 MG tablet Take 1 tablet (10 mg total) by mouth 2 (two) times daily. 60 tablet 3  . glucose blood test strip Use as instructed 100 each 12  . ibuprofen (ADVIL,MOTRIN) 800 MG tablet Take 1 tablet (800 mg total) by mouth 3 (three) times daily. 21 tablet 0  . metFORMIN (GLUCOPHAGE) 1000 MG tablet Take 1 tablet (1,000 mg total) by mouth 2 (two) times daily with a meal. 60 tablet 3  .  triamterene-hydrochlorothiazide (DYAZIDE) 37.5-25 MG capsule TAKE ONE CAPSULE BY MOUTH EVERY DAY 90 capsule 3  . ferrous sulfate 325 (65 FE) MG tablet Take 1 tablet (325 mg total) by mouth 2 (two) times daily with a meal. (Patient not taking: Reported on 10/26/2018) 30 tablet 2   No facility-administered medications prior to visit.     No Known Allergies  ROS Review of Systems  Constitutional: Negative.   HENT: Negative.   Eyes: Negative.   Respiratory: Negative.   Cardiovascular: Negative.   Gastrointestinal: Negative.   Endocrine: Negative.   Genitourinary: Negative.   Musculoskeletal: Negative.   Skin: Negative.   Allergic/Immunologic: Negative.   Neurological: Positive for dizziness and headaches.  Hematological: Negative.   Psychiatric/Behavioral: Negative.    Objective:    Physical Exam  Constitutional: She is oriented to person, place, and time. She appears well-developed and well-nourished.  HENT:  Head: Normocephalic and atraumatic.  Eyes: Conjunctivae are normal.  Neck: Normal range of motion. Neck supple.  Cardiovascular: Normal rate, regular rhythm, normal heart sounds and intact distal pulses.  Pulmonary/Chest: Effort normal and breath sounds normal.  Abdominal: Soft. Bowel sounds are normal.  Musculoskeletal: Normal range of motion.  Neurological: She is alert and oriented to person, place, and time. She has normal reflexes.  Skin: Skin is warm and dry.  Psychiatric: She has a normal mood and affect. Her behavior is normal. Judgment and thought content normal.  Nursing note and vitals reviewed.   BP 136/90   Pulse 74   Temp 98 F (36.7 C) (Oral)   Ht _0  (1.778 m)   Wt (!) 374 lb (169.6 kg)   SpO2 98%   BMI 53.66 kg/m  Wt Readings from Last 3 Encounters:  03/07/19 (!) 374 lb (169.6 kg)  02/05/19 (!) 382 lb 6.4 oz (173.5 kg)  01/27/19 (!) 382 lb (173.3 kg)     Health Maintenance Due  Topic Date Due  . PNEUMOCOCCAL POLYSACCHARIDE VACCINE AGE  19-64 HIGH RISK  03/23/1992  . OPHTHALMOLOGY EXAM  03/23/2000  . HIV Screening  03/23/2005  . TETANUS/TDAP  03/23/2009  . PAP-Cervical Cytology Screening  03/24/2011  . PAP SMEAR-Modifier  03/24/2011  . FOOT EXAM  11/01/2018    There are no preventive care reminders to display for this patient.  Lab Results  Component Value Date   TSH 2.050 05/02/2018   Lab Results  Component Value Date   WBC 9.1 11/25/2018   HGB 10.2 (L) 11/25/2018   HCT 34.6 (L) 11/25/2018   MCV 65.5 (L) 11/25/2018   PLT 351 11/25/2018   Lab Results  Component Value Date   NA 138  11/25/2018   K 3.6 11/25/2018   CO2 22 11/25/2018   GLUCOSE 260 (H) 11/25/2018   BUN 8 11/25/2018   CREATININE 0.65 11/25/2018   BILITOT <0.2 05/02/2018   ALKPHOS 78 05/02/2018   AST 6 05/02/2018   ALT 10 05/02/2018   PROT 7.8 05/02/2018   ALBUMIN 4.1 05/02/2018   CALCIUM 8.8 (L) 11/25/2018   ANIONGAP 9 11/25/2018   Lab Results  Component Value Date   CHOL 141 05/02/2018   Lab Results  Component Value Date   HDL 24 (L) 05/02/2018   Lab Results  Component Value Date   LDLCALC 55 05/02/2018   Lab Results  Component Value Date   TRIG 312 (H) 05/02/2018   Lab Results  Component Value Date   CHOLHDL 5.9 (H) 05/02/2018   Lab Results  Component Value Date   HGBA1C 10.9 (A) 03/07/2019    Assessment & Plan:   1. Type 2 diabetes mellitus without complication, without long-term current use of insulin (HCC) Hgb A1c is at 10.9 today. She will continue anti-diabetic medications as prescribed. She will continue to decrease foods/beverages high in sugars and carbs and follow Heart Healthy or DASH diet. Increase physical activity to at least 30 minutes cardio exercise daily.  - POCT glycosylated hemoglobin (Hb A1C) - POCT urinalysis dipstick - POCT glucose (manual entry)  2. Elevated glucose Blood glucose is 186 today.   3. Essential hypertension Blood pressure is stable today at 136/90. She will continue  Triam/HCTZ as prescribed. She will continue to decrease high sodium intake, excessive alcohol intake, increase potassium intake, smoking cessation, and increase physical activity of at least 30 minutes of cardio activity daily. She will continue to follow Heart Healthy or DASH diet.  4. Class 3 severe obesity due to excess calories with serious comorbidity and body mass index (BMI) of 50.0 to 59.9 in adult [facility]) Body mass index is 53.66 kg/m. Goal BMI  is <30. Encouraged efforts to reduce weight include engaging in physical activity as tolerated with goal of 150 minutes per week. Improve dietary choices and eat a meal regimen consistent with a Mediterranean or DASH diet. Reduce simple carbohydrates. Do not skip meals and eat healthy snacks throughout the day to avoid over-eating at dinner. Set a goal weight loss that is achievable for you.  5. Recent weight loss She has an 8 lb weight loss within 1 month.   6. Follow up She will follow up in 3 months.   No orders of the defined types were placed in this encounter.   Orders Placed This Encounter  Procedures  . POCT glycosylated hemoglobin (Hb A1C)  . POCT urinalysis dipstick  . POCT glucose (manual entry)   Referral Orders  No referral(s) requested today    Kathe Becton,  MSN, FNP-C Patient [place], [place] [address] [phone]   Problem List Items Addressed This Visit      Cardiovascular and Mediastinum   HTN (hypertension)    Other Visit Diagnoses    Type 2 diabetes mellitus without complication, without long-term current use of insulin ([place])    -  Primary   Relevant Orders   POCT glycosylated hemoglobin (Hb A1C) (Completed)   POCT urinalysis dipstick (Completed)   POCT glucose (manual entry) (Completed)   Elevated glucose       Class 3 severe obesity due to excess calories with serious comorbidity and body mass index (BMI) of 50.0 to 59.9 in adult (  [place])        Recent weight loss       Yeast infection       Follow up          No orders of the defined types were placed in this encounter.   Follow-up: Return in about 3 months (around 06/06/2019).    Azzie Glatter, FNP

## 2019-03-08 DIAGNOSIS — R7309 Other abnormal glucose: Secondary | ICD-10-CM | POA: Insufficient documentation

## 2019-03-08 DIAGNOSIS — E119 Type 2 diabetes mellitus without complications: Secondary | ICD-10-CM | POA: Insufficient documentation

## 2019-03-08 DIAGNOSIS — R634 Abnormal weight loss: Secondary | ICD-10-CM | POA: Insufficient documentation

## 2019-03-09 ENCOUNTER — Telehealth: Payer: Self-pay

## 2019-03-09 MED ORDER — FLUCONAZOLE 150 MG PO TABS: 150.0000 mg | ORAL_TABLET | ORAL | 0 refills | Status: AC

## 2019-03-09 NOTE — Telephone Encounter (Signed)
Patient notified

## 2019-03-09 NOTE — Telephone Encounter (Signed)
Patient is having some itching and burning and would like to have something sent into pharmacy. Patient was in the office early this week and had a trace of blood. Patient would like to have something sent in.

## 2019-03-27 ENCOUNTER — Other Ambulatory Visit: Payer: Self-pay | Admitting: Family Medicine

## 2019-03-27 DIAGNOSIS — E119 Type 2 diabetes mellitus without complications: Secondary | ICD-10-CM

## 2019-05-12 ENCOUNTER — Other Ambulatory Visit: Payer: Self-pay

## 2019-05-12 ENCOUNTER — Encounter: Payer: Self-pay | Admitting: Family Medicine

## 2019-05-12 ENCOUNTER — Ambulatory Visit (INDEPENDENT_AMBULATORY_CARE_PROVIDER_SITE_OTHER): Payer: BC Managed Care – PPO | Admitting: Family Medicine

## 2019-05-12 VITALS — BP 140/76 | HR 88 | Temp 97.9°F | Ht 70.0 in | Wt 374.0 lb

## 2019-05-12 DIAGNOSIS — R7309 Other abnormal glucose: Secondary | ICD-10-CM

## 2019-05-12 DIAGNOSIS — Z09 Encounter for follow-up examination after completed treatment for conditions other than malignant neoplasm: Secondary | ICD-10-CM

## 2019-05-12 DIAGNOSIS — R8290 Unspecified abnormal findings in urine: Secondary | ICD-10-CM

## 2019-05-12 DIAGNOSIS — R300 Dysuria: Secondary | ICD-10-CM | POA: Diagnosis not present

## 2019-05-12 DIAGNOSIS — N390 Urinary tract infection, site not specified: Secondary | ICD-10-CM | POA: Diagnosis not present

## 2019-05-12 LAB — POCT URINALYSIS DIP (MANUAL ENTRY)
Bilirubin, UA: NEGATIVE
Blood, UA: NEGATIVE
Glucose, UA: 250 mg/dL — AB
Nitrite, UA: NEGATIVE
Protein Ur, POC: NEGATIVE mg/dL
Spec Grav, UA: 1.02 (ref 1.010–1.025)
Urobilinogen, UA: 1 E.U./dL
pH, UA: 7 (ref 5.0–8.0)

## 2019-05-12 MED ORDER — SULFAMETHOXAZOLE-TRIMETHOPRIM 800-160 MG PO TABS: 1.0000 | ORAL_TABLET | Freq: Two times a day (BID) | ORAL | 0 refills | Status: DC

## 2019-05-12 NOTE — Progress Notes (Signed)
Patient [facility]    Subjective:  Patient ID: Grace Daniels, female    DOB: 1991/11/05  Age: 28 y.o. MRN: [id number]  CC:  Chief Complaint  Patient presents with  . Vaginal Itching  . Dysuria    HPI Grace Daniels is a 28 year old female who presents for Follow Up today.   Past Medical History:  Diagnosis Date  . Diabetes ([place])   . Hyperlipidemia   . Hypertension   . Morbid obesity ([place])   . Sleep apnea     Current Status: Since her last office visit, he has c/o pain and itching with urination X 1 week now. She has not used anything for relief of symptoms.   She denies fevers, chills, fatigue, recent infections, weight loss, and night sweats. She has not had any headaches, visual changes, dizziness, and falls. No chest pain, heart palpitations, cough and shortness of breath reported. No reports of GI problems such as nausea, vomiting, diarrhea, and constipation. She has no reports of blood in stools, dysuria and hematuria. No depression or anxiety reported. She denies pain today.    No past surgical history on file.  No family history on file.  Social History   Socioeconomic History  . Marital status: Single    Spouse name: Not on file  . Number of children: Not on file  . Years of education: Not on file  . Highest education level: Not on file  Occupational History  . Not on file  Social Needs  . Financial resource strain: Not on file  . Food insecurity    Worry: Not on file    Inability: Not on file  . Transportation needs    Medical: Not on file    Non-medical: Not on file  Tobacco Use  . Smoking status: Never Smoker  . Smokeless tobacco: Never Used  Substance and Sexual Activity  . Alcohol use: No  . Drug use: No  . Sexual activity: Never  Lifestyle  . Physical activity    Days per week: Not on file    Minutes per session: Not on file  . Stress: Not on file  Relationships  . Social Clinical research associate on phone: Not on file    Gets together: Not on file    Attends religious service: Not on file    Active member of club or organization: Not on file    Attends meetings of clubs or organizations: Not on file    Relationship status: Not on file  . Intimate partner violence    Fear of current or ex partner: Not on file    Emotionally abused: Not on file    Physically abused: Not on file    Forced sexual activity: Not on file  Other Topics Concern  . Not on file  Social History Narrative  . Not on file    Outpatient Medications Prior to Visit  Medication Sig Dispense Refill  . acetaminophen (TYLENOL) 500 MG tablet Take 500 mg by mouth every 6 (six) hours as needed for moderate pain.    . blood glucose meter kit and supplies KIT Dispense based on patient and insurance preference. Use up to four times daily as directed. (FOR ICD-9 250.00, 250.01). 1 each 0  . Blood Glucose Monitoring Suppl (ACURA BLOOD GLUCOSE METER) w/Device KIT 1 applicator by Does not apply route 4 (four) times daily -  before meals and at bedtime. 1 kit 1  .  FARXIGA 10 MG TABS tablet TAKE 1 TABLET BY MOUTH EVERY DAY 30 tablet 2  . glipiZIDE (GLUCOTROL) 10 MG tablet Take 1 tablet (10 mg total) by mouth 2 (two) times daily. 60 tablet 3  . glucose blood test strip Use as instructed 100 each 12  . ibuprofen (ADVIL,MOTRIN) 800 MG tablet Take 1 tablet (800 mg total) by mouth 3 (three) times daily. 21 tablet 0  . metFORMIN (GLUCOPHAGE) 1000 MG tablet Take 1 tablet (1,000 mg total) by mouth 2 (two) times daily with a meal. 60 tablet 3  . triamterene-hydrochlorothiazide (DYAZIDE) 37.5-25 MG capsule TAKE ONE CAPSULE BY MOUTH EVERY DAY 90 capsule 3   No facility-administered medications prior to visit.     No Known Allergies  ROS Review of Systems  Constitutional: Negative.   HENT: Negative.   Eyes: Negative.   Respiratory: Negative.   Cardiovascular: Negative.   Gastrointestinal: Positive for abdominal distention  (Obese).  Endocrine: Negative.   Genitourinary: Positive for dysuria.       Vaginal itching   Musculoskeletal: Negative.   Skin: Negative.   Allergic/Immunologic: Negative.   Neurological: Negative.   Hematological: Negative.   Psychiatric/Behavioral: Negative.       Objective:    Physical Exam  Constitutional: She is oriented to person, place, and time. She appears well-developed and well-nourished.  HENT:  Head: Normocephalic and atraumatic.  Eyes: Conjunctivae are normal.  Cardiovascular: Normal rate, regular rhythm, normal heart sounds and intact distal pulses.  Pulmonary/Chest: Effort normal and breath sounds normal.  Abdominal: Soft. Bowel sounds are normal.  Musculoskeletal: Normal range of motion.  Neurological: She is alert and oriented to person, place, and time. She has normal reflexes.  Skin: Skin is warm and dry.  Psychiatric: She has a normal mood and affect. Her behavior is normal. Judgment and thought content normal.  Nursing note and vitals reviewed.   BP 140/76 (BP Location: Left Arm, Patient Position: Sitting, Cuff Size: Large)   Pulse 88   Temp 97.9 F (36.6 C) (Oral)   Ht '5\' 10"'$  (1.778 m)   Wt (!) 374 lb (169.6 kg)   SpO2 98%   BMI 53.66 kg/m  Wt Readings from Last 3 Encounters:  05/12/19 (!) 374 lb (169.6 kg)  03/07/19 (!) 374 lb (169.6 kg)  02/05/19 (!) 382 lb 6.4 oz (173.5 kg)     Health Maintenance Due  Topic Date Due  . PNEUMOCOCCAL POLYSACCHARIDE VACCINE AGE 75-64 HIGH RISK  03/23/1992  . OPHTHALMOLOGY EXAM  03/23/2000  . HIV Screening  03/23/2005  . TETANUS/TDAP  03/23/2009  . PAP-Cervical Cytology Screening  03/24/2011  . PAP SMEAR-Modifier  03/24/2011  . FOOT EXAM  11/01/2018  . URINE MICROALBUMIN  05/17/2019    There are no preventive care reminders to display for this patient.  Lab Results  Component Value Date   TSH 2.050 05/02/2018   Lab Results  Component Value Date   WBC 9.1 11/25/2018   HGB 10.2 (L) 11/25/2018    HCT 34.6 (L) 11/25/2018   MCV 65.5 (L) 11/25/2018   PLT 351 11/25/2018   Lab Results  Component Value Date   NA 138 11/25/2018   K 3.6 11/25/2018   CO2 22 11/25/2018   GLUCOSE 260 (H) 11/25/2018   BUN 8 11/25/2018   CREATININE 0.65 11/25/2018   BILITOT <0.2 05/02/2018   ALKPHOS 78 05/02/2018   AST 6 05/02/2018   ALT 10 05/02/2018   PROT 7.8 05/02/2018   ALBUMIN 4.1 05/02/2018  CALCIUM 8.8 (L) 11/25/2018   ANIONGAP 9 11/25/2018   Lab Results  Component Value Date   CHOL 141 05/02/2018   Lab Results  Component Value Date   HDL 24 (L) 05/02/2018   Lab Results  Component Value Date   LDLCALC 55 05/02/2018   Lab Results  Component Value Date   TRIG 312 (H) 05/02/2018   Lab Results  Component Value Date   CHOLHDL 5.9 (H) 05/02/2018   Lab Results  Component Value Date   HGBA1C 10.9 (A) 03/07/2019      Assessment & Plan:   1. Dysuria - POCT urinalysis dipstick - NuSwab Vaginitis Plus (VG+)  2. Abnormal urinalysis Results are pending.  - Urine Culture  3. Urinary tract infection without hematuria, site unspecified Probable r/t increased Hgb A1c level. We will initiate Bactrim today.  - sulfamethoxazole-trimethoprim (BACTRIM DS) 800-160 MG tablet; Take 1 tablet by mouth 2 (two) times daily.  Dispense: 14 tablet; Refill: 0  4. Hgb A1c > 9.0 indicating poor glucose control Most recent Hgb A1c at 10.9. She will continue to decrease foods/beverages high in sugars and carbs and follow Heart Healthy or DASH diet. Increase physical activity to at least 30 minutes cardio exercise daily.   5. Follow up She will follow up in 1 month.   Meds ordered this encounter  Medications  . sulfamethoxazole-trimethoprim (BACTRIM DS) 800-160 MG tablet    Sig: Take 1 tablet by mouth 2 (two) times daily.    Dispense:  14 tablet    Refill:  0    Orders Placed This Encounter  Procedures  . Urine Culture  . NuSwab Vaginitis Plus (VG+)  . POCT urinalysis dipstick     Referral Orders  No referral(s) requested today    Kathe Becton,  MSN, FNP-BC Patient [place], [place] [phone]   Problem List Items Addressed This Visit    None    Visit Diagnoses    Dysuria    -  Primary   Relevant Orders   POCT urinalysis dipstick (Completed)   NuSwab Vaginitis Plus (VG+)   Abnormal urinalysis       Relevant Orders   Urine Culture (Completed)   Urinary tract infection without hematuria, site unspecified       Relevant Medications   sulfamethoxazole-trimethoprim (BACTRIM DS) 800-160 MG tablet   Hemoglobin A1C greater than 9%, indicating poor diabetic control       Follow up          Meds ordered this encounter  Medications  . sulfamethoxazole-trimethoprim (BACTRIM DS) 800-160 MG tablet    Sig: Take 1 tablet by mouth 2 (two) times daily.    Dispense:  14 tablet    Refill:  0    Follow-up: No follow-ups on file.    Azzie Glatter, FNP

## 2019-05-12 NOTE — Patient Instructions (Signed)
Urinary Tract Infection, Adult A urinary tract infection (UTI) is an infection of any part of the urinary tract. The urinary tract includes:  The kidneys.  The ureters.  The bladder.  The urethra. These organs make, store, and get rid of pee (urine) in the body. What are the causes? This is caused by germs (bacteria) in your genital area. These germs grow and cause swelling (inflammation) of your urinary tract. What increases the risk? You are more likely to develop this condition if:  You have a small, thin tube (catheter) to drain pee.  You cannot control when you pee or poop (incontinence).  You are female, and: ? You use these methods to prevent pregnancy: ? A medicine that kills sperm (spermicide). ? A device that blocks sperm (diaphragm). ? You have low levels of a female hormone (estrogen). ? You are pregnant.  You have genes that add to your risk.  You are sexually active.  You take antibiotic medicines.  You have trouble peeing because of: ? A prostate that is bigger than normal, if you are female. ? A blockage in the part of your body that drains pee from the bladder (urethra). ? A kidney stone. ? A nerve condition that affects your bladder (neurogenic bladder). ? Not getting enough to drink. ? Not peeing often enough.  You have other conditions, such as: ? Diabetes. ? A weak disease-fighting system (immune system). ? Sickle cell disease. ? Gout. ? Injury of the spine. What are the signs or symptoms? Symptoms of this condition include:  Needing to pee right away (urgently).  Peeing often.  Peeing small amounts often.  Pain or burning when peeing.  Blood in the pee.  Pee that smells bad or not like normal.  Trouble peeing.  Pee that is cloudy.  Fluid coming from the vagina, if you are female.  Pain in the belly or lower back. Other symptoms include:  Throwing up (vomiting).  No urge to eat.  Feeling mixed up (confused).  Being tired  and grouchy (irritable).  A fever.  Watery poop (diarrhea). How is this treated? This condition may be treated with:  Antibiotic medicine.  Other medicines.  Drinking enough water. Follow these instructions at home:  Medicines  Take over-the-counter and prescription medicines only as told by your doctor.  If you were prescribed an antibiotic medicine, take it as told by your doctor. Do not stop taking it even if you start to feel better. General instructions  Make sure you: ? Pee until your bladder is empty. ? Do not hold pee for a long time. ? Empty your bladder after sex. ? Wipe from front to back after pooping if you are a female. Use each tissue one time when you wipe.  Drink enough fluid to keep your pee pale yellow.  Keep all follow-up visits as told by your doctor. This is important. Contact a doctor if:  You do not get better after 1-2 days.  Your symptoms go away and then come back. Get help right away if:  You have very bad back pain.  You have very bad pain in your lower belly.  You have a fever.  You are sick to your stomach (nauseous).  You are throwing up. Summary  A urinary tract infection (UTI) is an infection of any part of the urinary tract.  This condition is caused by germs in your genital area.  There are many risk factors for a UTI. These include having a small, thin   tube to drain pee and not being able to control when you pee or poop.  Treatment includes antibiotic medicines for germs.  Drink enough fluid to keep your pee pale yellow. This information is not intended to replace advice given to you by your health care provider. Make sure you discuss any questions you have with your health care provider. Document Released: 04/20/2008 Document Revised: 10/20/2018 Document Reviewed: 05/12/2018 Elsevier Patient Education  [employer]. Sulfamethoxazole; Trimethoprim, SMX-TMP tablets What is this medicine? SULFAMETHOXAZOLE;  TRIMETHOPRIM or SMX-TMP (suhl fuh meth OK suh zohl; trye METH oh prim) is a combination of a sulfonamide antibiotic and a second antibiotic, trimethoprim. It is used to treat or prevent certain kinds of bacterial infections. It will not work for colds, flu, or other viral infections. This medicine may be used for other purposes; ask your health care provider or pharmacist if you have questions. COMMON BRAND NAME(S): Bacter-Aid DS, Bactrim, Bactrim DS, Septra, Septra DS What should I tell my health care provider before I take this medicine? They need to know if you have any of these conditions: -anemia -asthma -being treated with anticonvulsants -if you frequently drink alcohol containing drinks -kidney disease -liver disease -low level of folic acid or [id number] dehydrogenase -poor nutrition or malabsorption -porphyria -severe allergies -thyroid disorder -an unusual or allergic reaction to sulfamethoxazole, trimethoprim, sulfa drugs, other medicines, foods, dyes, or preservatives -pregnant or trying to get pregnant -breast-feeding How should I use this medicine? Take this medicine by mouth with a full glass of water. Follow the directions on the prescription label. Take your medicine at regular intervals. Do not take it more often than directed. Do not skip doses or stop your medicine early. Talk to your pediatrician regarding the use of this medicine in children. Special care may be needed. This medicine has been used in children as young as 29 months of age. Overdosage: If you think you have taken too much of this medicine contact a poison control center or emergency room at once. NOTE: This medicine is only for you. Do not share this medicine with others. What if I miss a dose? If you miss a dose, take it as soon as you can. If it is almost time for your next dose, take only that dose. Do not take double or extra doses. What may interact with this medicine? Do not take this  medicine with any of the following medications: -aminobenzoate potassium -dofetilide -metronidazole This medicine may also interact with the following medications: -ACE inhibitors like benazepril, enalapril, lisinopril, and ramipril -birth control pills -cyclosporine -digoxin -diuretics -indomethacin -medicines for diabetes -methenamine -methotrexate -phenytoin -potassium supplements -pyrimethamine -sulfinpyrazone -tricyclic antidepressants -warfarin This list may not describe all possible interactions. Give your health care provider a list of all the medicines, herbs, non-prescription drugs, or dietary supplements you use. Also tell them if you smoke, drink alcohol, or use illegal drugs. Some items may interact with your medicine. What should I watch for while using this medicine? Tell your doctor or health care professional if your symptoms do not improve. Drink several glasses of water a day to reduce the risk of kidney problems. Do not treat diarrhea with over the counter products. Contact your doctor if you have diarrhea that lasts more than 2 days or if it is severe and watery. This medicine can make you more sensitive to the sun. Keep out of the sun. If you cannot avoid being in the sun, wear protective clothing and use a sunscreen. Do not  use sun lamps or tanning beds/booths. What side effects may I notice from receiving this medicine? Side effects that you should report to your doctor or health care professional as soon as possible: -allergic reactions like skin rash or hives, swelling of the face, lips, or tongue -breathing problems -fever or chills, sore throat -irregular heartbeat, chest pain -joint or muscle pain -pain or difficulty passing urine -red pinpoint spots on skin -redness, blistering, peeling or loosening of the skin, including inside the mouth -unusual bleeding or bruising -unusually weak or tired -yellowing of the eyes or skin Side effects that usually  do not require medical attention (report to your doctor or health care professional if they continue or are bothersome): -diarrhea -dizziness -headache -loss of appetite -nausea, vomiting -nervousness This list may not describe all possible side effects. Call your doctor for medical advice about side effects. You may report side effects to FDA at 1-800-FDA-1088. Where should I keep my medicine? Keep out of the reach of children. Store at room temperature between 20 to 25 degrees C (68 to 77 degrees F). Protect from light. Throw away any unused medicine after the expiration date. NOTE: This sheet is a summary. It may not cover all possible information. If you have questions about this medicine, talk to your doctor, pharmacist, or health care provider.  2019 Elsevier/Gold Standard (2013-06-09 14:38:26)

## 2019-05-14 ENCOUNTER — Other Ambulatory Visit: Payer: Self-pay | Admitting: Family Medicine

## 2019-05-14 DIAGNOSIS — N390 Urinary tract infection, site not specified: Secondary | ICD-10-CM

## 2019-05-14 DIAGNOSIS — R7309 Other abnormal glucose: Secondary | ICD-10-CM | POA: Insufficient documentation

## 2019-05-14 DIAGNOSIS — R300 Dysuria: Secondary | ICD-10-CM | POA: Insufficient documentation

## 2019-05-14 LAB — URINE CULTURE

## 2019-05-14 MED ORDER — AMOXICILLIN 500 MG PO CAPS: 500.0000 mg | ORAL_CAPSULE | Freq: Two times a day (BID) | ORAL | 0 refills | Status: AC

## 2019-05-17 ENCOUNTER — Other Ambulatory Visit: Payer: Self-pay | Admitting: Family Medicine

## 2019-05-17 DIAGNOSIS — B379 Candidiasis, unspecified: Secondary | ICD-10-CM

## 2019-05-17 MED ORDER — FLUCONAZOLE 150 MG PO TABS: 150.0000 mg | ORAL_TABLET | Freq: Once | ORAL | 0 refills | Status: AC

## 2019-05-26 LAB — NUSWAB VAGINITIS PLUS (VG+)
Candida albicans, NAA: POSITIVE — AB
Candida glabrata, NAA: NEGATIVE
Chlamydia trachomatis, NAA: NEGATIVE
Neisseria gonorrhoeae, NAA: NEGATIVE
Trich vag by NAA: NEGATIVE

## 2019-05-28 ENCOUNTER — Telehealth: Payer: Self-pay | Admitting: Family Medicine

## 2019-05-28 NOTE — Telephone Encounter (Signed)
Multiple attempts to contact patient to discuss medication management.

## 2019-06-07 ENCOUNTER — Encounter: Payer: Self-pay | Admitting: Family Medicine

## 2019-06-07 ENCOUNTER — Ambulatory Visit (INDEPENDENT_AMBULATORY_CARE_PROVIDER_SITE_OTHER): Payer: BC Managed Care – PPO | Admitting: Family Medicine

## 2019-06-07 ENCOUNTER — Other Ambulatory Visit: Payer: Self-pay

## 2019-06-07 VITALS — BP 140/90 | HR 74 | Temp 98.4°F | Ht 70.0 in | Wt 371.0 lb

## 2019-06-07 DIAGNOSIS — B379 Candidiasis, unspecified: Secondary | ICD-10-CM | POA: Insufficient documentation

## 2019-06-07 DIAGNOSIS — Z09 Encounter for follow-up examination after completed treatment for conditions other than malignant neoplasm: Secondary | ICD-10-CM

## 2019-06-07 DIAGNOSIS — R739 Hyperglycemia, unspecified: Secondary | ICD-10-CM

## 2019-06-07 DIAGNOSIS — R7309 Other abnormal glucose: Secondary | ICD-10-CM | POA: Diagnosis not present

## 2019-06-07 DIAGNOSIS — I10 Essential (primary) hypertension: Secondary | ICD-10-CM

## 2019-06-07 DIAGNOSIS — E119 Type 2 diabetes mellitus without complications: Secondary | ICD-10-CM

## 2019-06-07 LAB — POCT URINALYSIS DIP (MANUAL ENTRY)
Bilirubin, UA: NEGATIVE
Blood, UA: NEGATIVE
Glucose, UA: 500 mg/dL — AB
Ketones, POC UA: NEGATIVE mg/dL
Leukocytes, UA: NEGATIVE
Nitrite, UA: NEGATIVE
Protein Ur, POC: NEGATIVE mg/dL
Spec Grav, UA: 1.025 (ref 1.010–1.025)
Urobilinogen, UA: 1 E.U./dL
pH, UA: 6 (ref 5.0–8.0)

## 2019-06-07 LAB — POCT GLYCOSYLATED HEMOGLOBIN (HGB A1C): Hemoglobin A1C: 8 % — AB (ref 4.0–5.6)

## 2019-06-07 LAB — GLUCOSE, POCT (MANUAL RESULT ENTRY): POC Glucose: 154 mg/dl — AB (ref 70–99)

## 2019-06-07 MED ORDER — FLUCONAZOLE 150 MG PO TABS: 150.0000 mg | ORAL_TABLET | Freq: Once | ORAL | 0 refills | Status: AC

## 2019-06-07 NOTE — Progress Notes (Signed)
Patient [facility]    Established Patient Office Visit  Subjective:  Patient ID: Grace Daniels, female    DOB: [birth year]  Age: 28 y.o. MRN: [id number]  CC:  Chief Complaint  Patient presents with  . Follow-up    chronic condition   . Flank Pain    left    HPI Grace Daniels is a 28 year old female who presents for follow up today.   Past Medical History:  Diagnosis Date  . Diabetes ([place])   . Hyperlipidemia   . Hypertension   . Morbid obesity ([place])   . Sleep apnea    Current Status: Since her last office visit, she is doing well with no complaints. She does not monitor her prep and post prandial blood glucose levels regularly. He has seen low range of 130 and high of 285 since his last office visit. She denies fatigue, frequent urination, blurred vision, excessive hunger, excessive thirst, weight gain, weight loss, and poor wound healing. She continues to check his feet regularly. She denies visual changes, chest pain, cough, shortness of breath, heart palpitations, and falls. She has occasional headaches and dizziness with position changes. Denies severe headaches, confusion, seizures, double vision, and blurred vision, nausea and vomiting.  She denies fevers, chills, recent infections, weight loss, and night sweats. She has not had any and falls. No chest pain, heart palpitations, cough and shortness of breath reported. No reports of GI problems such as  diarrhea, and constipation. She has no reports of blood in stools, dysuria and hematuria. No depression or anxiety reported. She denies pain today.     No past surgical history on file.  No family history on file.  Social History   Socioeconomic History  . Marital status: Single    Spouse name: Not on file  . Number of children: Not on file  . Years of education: Not on file  . Highest education level: Not on file  Occupational History  . Not on file  Social Needs  .  Financial resource strain: Not on file  . Food insecurity    Worry: Not on file    Inability: Not on file  . Transportation needs    Medical: Not on file    Non-medical: Not on file  Tobacco Use  . Smoking status: Never Smoker  . Smokeless tobacco: Never Used  Substance and Sexual Activity  . Alcohol use: No  . Drug use: No  . Sexual activity: Never  Lifestyle  . Physical activity    Days per week: Not on file    Minutes per session: Not on file  . Stress: Not on file  Relationships  . Social Herbalist on phone: Not on file    Gets together: Not on file    Attends religious service: Not on file    Active member of club or organization: Not on file    Attends meetings of clubs or organizations: Not on file    Relationship status: Not on file  . Intimate partner violence    Fear of current or ex partner: Not on file    Emotionally abused: Not on file    Physically abused: Not on file    Forced sexual activity: Not on file  Other Topics Concern  . Not on file  Social History Narrative  . Not on file    Outpatient Medications Prior to Visit  Medication Sig Dispense Refill  .  acetaminophen (TYLENOL) 500 MG tablet Take 500 mg by mouth every 6 (six) hours as needed for moderate pain.    . blood glucose meter kit and supplies KIT Dispense based on patient and insurance preference. Use up to four times daily as directed. (FOR ICD-9 250.00, 250.01). 1 each 0  . Blood Glucose Monitoring Suppl (ACURA BLOOD GLUCOSE METER) w/Device KIT 1 applicator by Does not apply route 4 (four) times daily -  before meals and at bedtime. 1 kit 1  . FARXIGA 10 MG TABS tablet TAKE 1 TABLET BY MOUTH EVERY DAY 30 tablet 2  . glipiZIDE (GLUCOTROL) 10 MG tablet Take 1 tablet (10 mg total) by mouth 2 (two) times daily. 60 tablet 3  . glucose blood test strip Use as instructed 100 each 12  . ibuprofen (ADVIL,MOTRIN) 800 MG tablet Take 1 tablet (800 mg total) by mouth 3 (three) times daily. 21  tablet 0  . metFORMIN (GLUCOPHAGE) 1000 MG tablet Take 1 tablet (1,000 mg total) by mouth 2 (two) times daily with a meal. 60 tablet 3  . sulfamethoxazole-trimethoprim (BACTRIM DS) 800-160 MG tablet Take 1 tablet by mouth 2 (two) times daily. 14 tablet 0  . triamterene-hydrochlorothiazide (DYAZIDE) 37.5-25 MG capsule TAKE ONE CAPSULE BY MOUTH EVERY DAY 90 capsule 3   No facility-administered medications prior to visit.     No Known Allergies  ROS Review of Systems  Constitutional: Negative.   HENT: Negative.   Eyes: Negative.   Respiratory: Negative.   Cardiovascular: Negative.   Gastrointestinal: Positive for abdominal distention (obese).  Endocrine: Negative.   Musculoskeletal: Negative.   Skin: Negative.   Allergic/Immunologic: Negative.   Neurological: Positive for dizziness (occasional) and headaches (occasional ).  Hematological: Negative.   Psychiatric/Behavioral: Negative.    Objective:    Physical Exam  Constitutional: She is oriented to person, place, and time. She appears well-developed and well-nourished.  HENT:  Head: Normocephalic and atraumatic.  Eyes: Conjunctivae are normal.  Neck: Normal range of motion. Neck supple.  Cardiovascular: Normal rate, regular rhythm, normal heart sounds and intact distal pulses.  Pulmonary/Chest: Effort normal and breath sounds normal.  Abdominal: Soft. Bowel sounds are normal.  Musculoskeletal: Normal range of motion.  Neurological: She is alert and oriented to person, place, and time. She has normal reflexes.  Skin: Skin is warm and dry.  Psychiatric: She has a normal mood and affect. Her behavior is normal. Judgment and thought content normal.  Nursing note and vitals reviewed.   BP 140/90 (BP Location: Left Arm, Patient Position: Sitting, Cuff Size: Large)   Pulse 74   Temp 98.4 F (36.9 C) (Oral)   Ht '5\' 10"'$  (1.778 m)   Wt (!) 371 lb (168.3 kg)   SpO2 96%   BMI 53.23 kg/m  Wt Readings from Last 3 Encounters:   06/07/19 (!) 371 lb (168.3 kg)  05/12/19 (!) 374 lb (169.6 kg)  03/07/19 (!) 374 lb (169.6 kg)     Health Maintenance Due  Topic Date Due  . OPHTHALMOLOGY EXAM  03/23/2000  . HIV Screening  03/23/2005  . PAP-Cervical Cytology Screening  03/24/2011  . FOOT EXAM  11/01/2018  . URINE MICROALBUMIN  05/17/2019    There are no preventive care reminders to display for this patient.  Lab Results  Component Value Date   TSH 2.050 05/02/2018   Lab Results  Component Value Date   WBC 9.1 11/25/2018   HGB 10.2 (L) 11/25/2018   HCT 34.6 (L) 11/25/2018  MCV 65.5 (L) 11/25/2018   PLT 351 11/25/2018   Lab Results  Component Value Date   NA 138 11/25/2018   K 3.6 11/25/2018   CO2 22 11/25/2018   GLUCOSE 260 (H) 11/25/2018   BUN 8 11/25/2018   CREATININE 0.65 11/25/2018   BILITOT <0.2 05/02/2018   ALKPHOS 78 05/02/2018   AST 6 05/02/2018   ALT 10 05/02/2018   PROT 7.8 05/02/2018   ALBUMIN 4.1 05/02/2018   CALCIUM 8.8 (L) 11/25/2018   ANIONGAP 9 11/25/2018   Lab Results  Component Value Date   CHOL 141 05/02/2018   Lab Results  Component Value Date   HDL 24 (L) 05/02/2018   Lab Results  Component Value Date   LDLCALC 55 05/02/2018   Lab Results  Component Value Date   TRIG 312 (H) 05/02/2018   Lab Results  Component Value Date   CHOLHDL 5.9 (H) 05/02/2018   Lab Results  Component Value Date   HGBA1C 8.0 (A) 06/07/2019      Assessment & Plan:   1. Type 2 diabetes mellitus without complication, without long-term current use of insulin (HCC) The current medical regimen is effective; blood glucose is stable at 154 today, from 290 on 03/07/2019; continue present plan and medications as prescribed. She will continue to decrease foods/beverages high in sugars and carbs and follow Heart Healthy or DASH diet. Increase physical activity to at least 30 minutes cardio exercise daily.  - POCT glycosylated hemoglobin (Hb A1C) - POCT urinalysis dipstick - POCT glucose  (manual entry) - CBC with Differential - Comprehensive metabolic panel; Future - Lipid Panel - TSH  2. Hemoglobin A1c between 7.0% and 9.0% Much improved. Hgb A1c is decreased at 8.0 today from 11.7 on 03/07/2019. Continue medications as prescribed.   3. Essential hypertension The current medical regimen is effective; blood pressure is stable at 140/90 today; continue present plan and medications as prescribed. She will continue to decrease high sodium intake, excessive alcohol intake, increase potassium intake, smoking cessation, and increase physical activity of at least 30 minutes of cardio activity daily. She will continue to follow Heart Healthy or DASH diet.  4. Yeast infection We will initiate Fluconazole as prescribed.  - fluconazole (DIFLUCAN) 150 MG tablet; Take 1 tablet (150 mg total) by mouth once for 1 dose.  Dispense: 2 tablet; Refill: 0  5. Follow up She will follow up in 3 months.   Meds ordered this encounter  Medications  . fluconazole (DIFLUCAN) 150 MG tablet    Sig: Take 1 tablet (150 mg total) by mouth once for 1 dose.    Dispense:  2 tablet    Refill:  0    Orders Placed This Encounter  Procedures  . CBC with Differential  . Comprehensive metabolic panel  . Lipid Panel  . TSH  . POCT glycosylated hemoglobin (Hb A1C)  . POCT urinalysis dipstick  . POCT glucose (manual entry)    Referral Orders  No referral(s) requested today   Kathe Becton,  MSN, FNP-BC [place], [place] [address] [phone] fax  Problem List Items Addressed This Visit      Endocrine   Type 2 diabetes mellitus without complication, without long-term current use of insulin ([place]) - Primary   Relevant Orders   POCT glycosylated hemoglobin (Hb A1C) (Completed)   POCT urinalysis dipstick (Completed)   POCT glucose (manual entry) (Completed)  No orders of the  defined types were placed in this encounter.   Follow-up: No follow-ups on file.    Azzie Glatter, FNP

## 2019-06-07 NOTE — Patient Instructions (Signed)
Fluconazole tablets What is this medicine? FLUCONAZOLE (floo KON na zole) is an antifungal medicine. It is used to treat certain kinds of fungal or yeast infections. This medicine may be used for other purposes; ask your health care provider or pharmacist if you have questions. COMMON BRAND NAME(S): Diflucan What should I tell my health care provider before I take this medicine? They need to know if you have any of these conditions:  history of irregular heart beat  kidney disease  an unusual or allergic reaction to fluconazole, other azole antifungals, medicines, foods, dyes, or preservatives  pregnant or trying to get pregnant  breast-feeding How should I use this medicine? Take this medicine by mouth. Follow the directions on the prescription label. Do not take your medicine more often than directed. Talk to your pediatrician regarding the use of this medicine in children. Special care may be needed. This medicine has been used in children as young as 6 months of age. Overdosage: If you think you have taken too much of this medicine contact a poison control center or emergency room at once. NOTE: This medicine is only for you. Do not share this medicine with others. What if I miss a dose? If you miss a dose, take it as soon as you can. If it is almost time for your next dose, take only that dose. Do not take double or extra doses. What may interact with this medicine? Do not take this medicine with any of the following medications:  astemizole  certain medicines for irregular heart beat like dronedarone, quinidine  cisapride  erythromycin  lomitapide  other medicines that prolong the QT interval (cause an abnormal heart rhythm)  pimozide  terfenadine  thioridazine  tolvaptan This medicine may also interact with the following medications:  antiviral medicines for HIV or AIDS  birth control pills  certain antibiotics like rifabutin, rifampin  certain medicines  for blood pressure like amlodipine, isradipine, felodipine, hydrochlorothiazide, losartan, nifedipine  certain medicines for cancer like cyclophosphamide, vinblastine, vincristine  certain medicines for cholesterol like atorvastatin, lovastatin, fluvastatin, simvastatin  certain medicines for depression, anxiety, or psychotic disturbances like amitriptyline, midazolam, nortriptyline, triazolam  certain medicines for diabetes like glipizide, glyburide, tolbutamide  certain medicines for pain like alfentanil, fentanyl, methadone  certain medicines for seizures like carbamazepine, phenytoin  certain medicines that treat or prevent blood clots like warfarin  dofetilide  halofantrine  medicines that lower your chance of fighting infection like cyclosporine, prednisone, tacrolimus  NSAIDS, medicines for pain and inflammation, like celecoxib, diclofenac, flurbiprofen, ibuprofen, meloxicam, naproxen  other medicines for fungal infections  sirolimus  theophylline  tofacitinib  ziprasidone This list may not describe all possible interactions. Give your health care provider a list of all the medicines, herbs, non-prescription drugs, or dietary supplements you use. Also tell them if you smoke, drink alcohol, or use illegal drugs. Some items may interact with your medicine. What should I watch for while using this medicine? Visit your doctor or health care professional for regular checkups. If you are taking this medicine for a long time you may need blood work. Tell your doctor if your symptoms do not improve. Some fungal infections need many weeks or months of treatment to cure. Alcohol can increase possible damage to your liver. Avoid alcoholic drinks. If you have a vaginal infection, do not have sex until you have finished your treatment. You can wear a sanitary napkin. Do not use tampons. Wear freshly washed cotton, not synthetic, panties. What side effects may   I notice from receiving  this medicine? Side effects that you should report to your doctor or health care professional as soon as possible:  allergic reactions like skin rash or itching, hives, swelling of the lips, mouth, tongue, or throat  dark urine  feeling dizzy or faint  irregular heartbeat or chest pain  redness, blistering, peeling or loosening of the skin, including inside the mouth  trouble breathing  unusual bruising or bleeding  vomiting  yellowing of the eyes or skin Side effects that usually do not require medical attention (report to your doctor or health care professional if they continue or are bothersome):  changes in how food tastes  diarrhea  headache  stomach upset or nausea This list may not describe all possible side effects. Call your doctor for medical advice about side effects. You may report side effects to FDA at 1-800-FDA-1088. Where should I keep my medicine? Keep out of the reach of children. Store at room temperature below 30 degrees C (86 degrees F). Throw away any medicine after the expiration date. NOTE: This sheet is a summary. It may not cover all possible information. If you have questions about this medicine, talk to your doctor, pharmacist, or health care provider.  2020 Elsevier/Gold Standard (2018-10-24 11:58:26)  

## 2019-06-09 ENCOUNTER — Ambulatory Visit: Payer: BLUE CROSS/BLUE SHIELD | Admitting: Family Medicine

## 2019-08-01 ENCOUNTER — Ambulatory Visit (INDEPENDENT_AMBULATORY_CARE_PROVIDER_SITE_OTHER): Payer: BC Managed Care – PPO | Admitting: Family Medicine

## 2019-08-01 ENCOUNTER — Other Ambulatory Visit: Payer: Self-pay

## 2019-08-01 VITALS — BP 133/79 | HR 100 | Ht 70.0 in | Wt 374.2 lb

## 2019-08-01 DIAGNOSIS — I10 Essential (primary) hypertension: Secondary | ICD-10-CM

## 2019-08-01 DIAGNOSIS — N949 Unspecified condition associated with female genital organs and menstrual cycle: Secondary | ICD-10-CM

## 2019-08-01 DIAGNOSIS — E119 Type 2 diabetes mellitus without complications: Secondary | ICD-10-CM | POA: Diagnosis not present

## 2019-08-01 DIAGNOSIS — R7309 Other abnormal glucose: Secondary | ICD-10-CM

## 2019-08-01 DIAGNOSIS — R8290 Unspecified abnormal findings in urine: Secondary | ICD-10-CM | POA: Diagnosis not present

## 2019-08-01 DIAGNOSIS — N9489 Other specified conditions associated with female genital organs and menstrual cycle: Secondary | ICD-10-CM | POA: Insufficient documentation

## 2019-08-01 DIAGNOSIS — N898 Other specified noninflammatory disorders of vagina: Secondary | ICD-10-CM | POA: Diagnosis not present

## 2019-08-01 DIAGNOSIS — R739 Hyperglycemia, unspecified: Secondary | ICD-10-CM

## 2019-08-01 DIAGNOSIS — Z09 Encounter for follow-up examination after completed treatment for conditions other than malignant neoplasm: Secondary | ICD-10-CM

## 2019-08-01 MED ORDER — FLUCONAZOLE 150 MG PO TABS: 150.0000 mg | ORAL_TABLET | Freq: Once | ORAL | 0 refills | Status: AC

## 2019-08-01 NOTE — Progress Notes (Signed)
Patient [facility]   Sick Visit  Subjective:  Patient ID: JANEENE SAND, female    DOB: [birth year]  Age: 28 y.o. MRN: [id number]  CC:  Chief Complaint  Patient presents with  . vaginal irritation    vaginal irritation, notice about one week ago,icthy    HPI HANA TRIPPETT is a 28 year old female who presents for Follow Up today.   Past Medical History:  Diagnosis Date  . Diabetes ([place])   . Hyperlipidemia   . Hypertension   . Morbid obesity ([place])   . Sleep apnea    Current Status: Since her last office visit, she is having burning and itching X 1 week. She has not used any medication for relief of her symptoms. He denies urinary frequency, discharge, dysuria, urinary odor, hematuria, and suprapubic pain/discomfort. She denies visual changes, chest pain, cough, shortness of breath, heart palpitations, and falls. She has occasional headaches and dizziness with position changes. Denies severe headaches, confusion, seizures, double vision, and blurred vision, nausea and vomiting. Her most recent normal range of preprandial blood glucose levels have been between 148. She has seen low range of 120 and high of 160 since his last office visit. She denies fatigue, frequent urination, blurred vision, excessive hunger, excessive thirst, weight gain, weight loss, and poor wound healing. She continues to check her feet regularly. Her anxiety is stable today. She denies suicidal ideations, homicidal ideations, or auditory hallucinations.   She denies fevers, chills, fatigue, recent infections, weight loss, and night sweats.  No reports of GI problems such as diarrhea, and constipation. She has no reports of blood in stools. She denies pain today.    No past surgical history on file.  No family history on file.  Social History   Socioeconomic History  . Marital status: Single    Spouse name: Not on file  . Number of children: Not on file  . Years  of education: Not on file  . Highest education level: Not on file  Occupational History  . Not on file  Social Needs  . Financial resource strain: Not on file  . Food insecurity    Worry: Not on file    Inability: Not on file  . Transportation needs    Medical: Not on file    Non-medical: Not on file  Tobacco Use  . Smoking status: Never Smoker  . Smokeless tobacco: Never Used  Substance and Sexual Activity  . Alcohol use: No  . Drug use: No  . Sexual activity: Never  Lifestyle  . Physical activity    Days per week: Not on file    Minutes per session: Not on file  . Stress: Not on file  Relationships  . Social Herbalist on phone: Not on file    Gets together: Not on file    Attends religious service: Not on file    Active member of club or organization: Not on file    Attends meetings of clubs or organizations: Not on file    Relationship status: Not on file  . Intimate partner violence    Fear of current or ex partner: Not on file    Emotionally abused: Not on file    Physically abused: Not on file    Forced sexual activity: Not on file  Other Topics Concern  . Not on file  Social History Narrative  . Not on file    Outpatient  Medications Prior to Visit  Medication Sig Dispense Refill  . FARXIGA 10 MG TABS tablet TAKE 1 TABLET BY MOUTH EVERY DAY 30 tablet 2  . glipiZIDE (GLUCOTROL) 10 MG tablet Take 1 tablet (10 mg total) by mouth 2 (two) times daily. 60 tablet 3  . glucose blood test strip Use as instructed 100 each 12  . metFORMIN (GLUCOPHAGE) 1000 MG tablet Take 1 tablet (1,000 mg total) by mouth 2 (two) times daily with a meal. 60 tablet 3  . triamterene-hydrochlorothiazide (DYAZIDE) 37.5-25 MG capsule TAKE ONE CAPSULE BY MOUTH EVERY DAY 90 capsule 3  . blood glucose meter kit and supplies KIT Dispense based on patient and insurance preference. Use up to four times daily as directed. (FOR ICD-9 250.00, 250.01). (Patient not taking: Reported on  08/01/2019) 1 each 0  . Blood Glucose Monitoring Suppl (ACURA BLOOD GLUCOSE METER) w/Device KIT 1 applicator by Does not apply route 4 (four) times daily -  before meals and at bedtime. (Patient not taking: Reported on 08/01/2019) 1 kit 1  . acetaminophen (TYLENOL) 500 MG tablet Take 500 mg by mouth every 6 (six) hours as needed for moderate pain.    Marland Kitchen ibuprofen (ADVIL,MOTRIN) 800 MG tablet Take 1 tablet (800 mg total) by mouth 3 (three) times daily. (Patient not taking: Reported on 08/01/2019) 21 tablet 0  . sulfamethoxazole-trimethoprim (BACTRIM DS) 800-160 MG tablet Take 1 tablet by mouth 2 (two) times daily. (Patient not taking: Reported on 08/01/2019) 14 tablet 0   No facility-administered medications prior to visit.     No Known Allergies  ROS Review of Systems  Constitutional: Negative.   HENT: Negative.   Eyes: Negative.   Respiratory: Negative.   Cardiovascular: Negative.   Gastrointestinal: Positive for abdominal distention (obese).  Endocrine: Negative.   Genitourinary: Negative.        Vaginal itching and burning.   Musculoskeletal: Negative.   Skin: Negative.       Objective:    Physical Exam  Constitutional: She is oriented to person, place, and time. She appears well-developed and well-nourished.  HENT:  Head: Normocephalic and atraumatic.  Eyes: Conjunctivae are normal.  Neck: Normal range of motion. Neck supple.  Cardiovascular: Normal rate, regular rhythm, normal heart sounds and intact distal pulses.  Pulmonary/Chest: Effort normal and breath sounds normal.  Abdominal: Soft. Bowel sounds are normal. She exhibits distension (obese).  Musculoskeletal: Normal range of motion.  Neurological: She is alert and oriented to person, place, and time. She has normal reflexes.  Skin: Skin is warm and dry.  Psychiatric: She has a normal mood and affect. Her behavior is normal. Judgment and thought content normal.  Nursing note and vitals reviewed.   BP 133/79 (BP  Location: Left Arm, Patient Position: Sitting, Cuff Size: Normal)   Pulse 100   Ht '5\' 10"'$  (1.778 m)   Wt (!) 374 lb 3.2 oz (169.7 kg)   SpO2 97%   BMI 53.69 kg/m  Wt Readings from Last 3 Encounters:  08/01/19 (!) 374 lb 3.2 oz (169.7 kg)  06/07/19 (!) 371 lb (168.3 kg)  05/12/19 (!) 374 lb (169.6 kg)     Health Maintenance Due  Topic Date Due  . OPHTHALMOLOGY EXAM  03/23/2000  . HIV Screening  03/23/2005  . PAP-Cervical Cytology Screening  03/24/2011  . FOOT EXAM  11/01/2018  . URINE MICROALBUMIN  05/17/2019  . INFLUENZA VACCINE  06/17/2019    There are no preventive care reminders to display for this patient.  Lab  Results  Component Value Date   TSH 2.050 05/02/2018   Lab Results  Component Value Date   WBC 9.1 11/25/2018   HGB 10.2 (L) 11/25/2018   HCT 34.6 (L) 11/25/2018   MCV 65.5 (L) 11/25/2018   PLT 351 11/25/2018   Lab Results  Component Value Date   NA 138 11/25/2018   K 3.6 11/25/2018   CO2 22 11/25/2018   GLUCOSE 260 (H) 11/25/2018   BUN 8 11/25/2018   CREATININE 0.65 11/25/2018   BILITOT <0.2 05/02/2018   ALKPHOS 78 05/02/2018   AST 6 05/02/2018   ALT 10 05/02/2018   PROT 7.8 05/02/2018   ALBUMIN 4.1 05/02/2018   CALCIUM 8.8 (L) 11/25/2018   ANIONGAP 9 11/25/2018   Lab Results  Component Value Date   CHOL 141 05/02/2018   Lab Results  Component Value Date   HDL 24 (L) 05/02/2018   Lab Results  Component Value Date   LDLCALC 55 05/02/2018   Lab Results  Component Value Date   TRIG 312 (H) 05/02/2018   Lab Results  Component Value Date   CHOLHDL 5.9 (H) 05/02/2018   Lab Results  Component Value Date   HGBA1C 8.0 (A) 06/07/2019    Assessment & Plan:   1. Vaginal itching We will initiate Diflucan today.  - Vaginitis/Vaginosis, DNA Probe  2. Vaginal burning - Vaginitis/Vaginosis, DNA Probe  3. Type 2 diabetes mellitus without complication, without long-term current use of insulin ([place]) Improve. She will continue to  decrease foods/beverages high in sugars and carbs and follow Heart Healthy or DASH diet. Increase physical activity to at least 30 minutes cardio exercise daily.   4. Hemoglobin A1c between 7.0% and 9.0%  5. Essential hypertension The current medical regimen is effective; blood pressure is stable at 133/79 today; continue present plan and medications as prescribed. She will continue to decrease high sodium intake, excessive alcohol intake, increase potassium intake, smoking cessation, and increase physical activity of at least 30 minutes of cardio activity daily. She will continue to follow Heart Healthy or DASH diet.  6. Follow up Keep follow up appointment in 08/2019.  No orders of the defined types were placed in this encounter.   Orders Placed This Encounter  Procedures  . Vaginitis/Vaginosis, DNA Probe    Referral Orders  No referral(s) requested today    Kathe Becton,  MSN, FNP-BC [address] [place], [place] [address] [phone] fax   Problem List Items Addressed This Visit      Cardiovascular and Mediastinum   HTN (hypertension)     Endocrine   Type 2 diabetes mellitus without complication, without long-term current use of insulin ([place])    Other Visit Diagnoses    Vaginal itching    -  Primary   Relevant Orders   Vaginitis/Vaginosis, DNA Probe   Vaginal burning       Relevant Orders   Vaginitis/Vaginosis, DNA Probe   Hemoglobin A1c between 7.0% and 9.0%       Follow up          No orders of the defined types were placed in this encounter.   Follow-up: No follow-ups on file.    Azzie Glatter, FNP

## 2019-08-03 LAB — BACTERIAL VAGINOSIS, NAA

## 2019-08-03 LAB — URINE CULTURE

## 2019-08-04 ENCOUNTER — Other Ambulatory Visit: Payer: Self-pay | Admitting: Family Medicine

## 2019-08-04 DIAGNOSIS — N390 Urinary tract infection, site not specified: Secondary | ICD-10-CM

## 2019-08-04 MED ORDER — AMOXICILLIN 500 MG PO TABS: 500.0000 mg | ORAL_TABLET | Freq: Two times a day (BID) | ORAL | 0 refills | Status: DC

## 2019-09-06 ENCOUNTER — Other Ambulatory Visit: Payer: Self-pay

## 2019-09-06 ENCOUNTER — Encounter: Payer: Self-pay | Admitting: Family Medicine

## 2019-09-06 ENCOUNTER — Ambulatory Visit (INDEPENDENT_AMBULATORY_CARE_PROVIDER_SITE_OTHER): Payer: BC Managed Care – PPO | Admitting: Family Medicine

## 2019-09-06 VITALS — BP 143/88 | HR 100 | Temp 97.7°F | Ht 70.0 in | Wt 365.6 lb

## 2019-09-06 DIAGNOSIS — R399 Unspecified symptoms and signs involving the genitourinary system: Secondary | ICD-10-CM | POA: Diagnosis not present

## 2019-09-06 DIAGNOSIS — N898 Other specified noninflammatory disorders of vagina: Secondary | ICD-10-CM

## 2019-09-06 DIAGNOSIS — Z09 Encounter for follow-up examination after completed treatment for conditions other than malignant neoplasm: Secondary | ICD-10-CM

## 2019-09-06 DIAGNOSIS — I10 Essential (primary) hypertension: Secondary | ICD-10-CM

## 2019-09-06 DIAGNOSIS — R634 Abnormal weight loss: Secondary | ICD-10-CM

## 2019-09-06 DIAGNOSIS — E119 Type 2 diabetes mellitus without complications: Secondary | ICD-10-CM

## 2019-09-06 LAB — POCT URINALYSIS DIPSTICK
Glucose, UA: POSITIVE — AB
Leukocytes, UA: NEGATIVE
Nitrite, UA: NEGATIVE
Protein, UA: POSITIVE — AB
Spec Grav, UA: <=1.005 — AB
Urobilinogen, UA: 0.2 U/dL
pH, UA: 5.5

## 2019-09-06 LAB — POCT GLYCOSYLATED HEMOGLOBIN (HGB A1C): Hemoglobin A1C: 7.2 % — AB (ref 4.0–5.6)

## 2019-09-06 LAB — GLUCOSE, POCT (MANUAL RESULT ENTRY): POC Glucose: 132 mg/dl — AB (ref 70–99)

## 2019-09-06 MED ORDER — METRONIDAZOLE 500 MG PO TABS: 500.0000 mg | ORAL_TABLET | Freq: Two times a day (BID) | ORAL | 0 refills | Status: DC

## 2019-09-06 MED ORDER — FLUCONAZOLE 150 MG PO TABS: 150.0000 mg | ORAL_TABLET | Freq: Once | ORAL | 0 refills | Status: DC

## 2019-09-06 MED ORDER — ATORVASTATIN CALCIUM 10 MG PO TABS: 10.0000 mg | ORAL_TABLET | Freq: Every day | ORAL | 1 refills | Status: DC

## 2019-09-06 NOTE — Progress Notes (Signed)
Patient [facility]   Established Patient Office Visit  Subjective:  Patient ID: Grace Daniels, female    DOB: 09-Oct-1991  Age: 28 y.o. MRN: [id number]  CC:  Chief Complaint  Patient presents with  . Follow-up    DM    HPI Grace Daniels is a 28 year old who presents for Follow Up today.   Past Medical History:  Diagnosis Date  . Diabetes ([place])   . Hyperlipidemia   . Hypertension   . Morbid obesity ([place])   . Sleep apnea    Current Status: Since she last office visit, she is doing well. She denies fevers, chills, fatigue, recent infections, weight loss, and night sweats. She has c/o urinary frequency. She denies urinary frequency, discharge, dysuria, urinary itching, burning, odor, hematuria, and suprapubic pain/discomfort. Her most recent normal range of preprandial blood glucose levels have been between 118-140. She has seen low range of 66 and high of 140 since his last office visit. She denies fatigue, frequent urination, blurred vision, excessive hunger, excessive thirst, weight gain, weight loss, and poor wound healing. She continues to check her feet regularly. She has not had any headaches, visual changes, dizziness, and falls. No chest pain, heart palpitations, cough and shortness of breath reported. No reports of GI problems such as nausea, vomiting, diarrhea, and constipation. She has no reports of blood in stools, dysuria and hematuria. No depression or anxiety, and denies suicidal ideations, homicidal ideations, or auditory hallucinations. She  denies pain today.   History reviewed. No pertinent surgical history.  History reviewed. No pertinent family history.  Social History   Socioeconomic History  . Marital status: Single    Spouse name: Not on file  . Number of children: Not on file  . Years of education: Not on file  . Highest education level: Not on file  Occupational History  . Not on file  Social Needs  .  Financial resource strain: Not on file  . Food insecurity    Worry: Not on file    Inability: Not on file  . Transportation needs    Medical: Not on file    Non-medical: Not on file  Tobacco Use  . Smoking status: Never Smoker  . Smokeless tobacco: Never Used  Substance and Sexual Activity  . Alcohol use: No  . Drug use: No  . Sexual activity: Never  Lifestyle  . Physical activity    Days per week: Not on file    Minutes per session: Not on file  . Stress: Not on file  Relationships  . Social Herbalist on phone: Not on file    Gets together: Not on file    Attends religious service: Not on file    Active member of club or organization: Not on file    Attends meetings of clubs or organizations: Not on file    Relationship status: Not on file  . Intimate partner violence    Fear of current or ex partner: Not on file    Emotionally abused: Not on file    Physically abused: Not on file    Forced sexual activity: Not on file  Other Topics Concern  . Not on file  Social History Narrative  . Not on file    Outpatient Medications Prior to Visit  Medication Sig Dispense Refill  . blood glucose meter kit and supplies KIT Dispense based on patient and insurance preference. Use up  to four times daily as directed. (FOR ICD-9 250.00, 250.01). 1 each 0  . Blood Glucose Monitoring Suppl (ACURA BLOOD GLUCOSE METER) w/Device KIT 1 applicator by Does not apply route 4 (four) times daily -  before meals and at bedtime. 1 kit 1  . FARXIGA 10 MG TABS tablet TAKE 1 TABLET BY MOUTH EVERY DAY 30 tablet 2  . glipiZIDE (GLUCOTROL) 10 MG tablet Take 1 tablet (10 mg total) by mouth 2 (two) times daily. 60 tablet 3  . glucose blood test strip Use as instructed 100 each 12  . metFORMIN (GLUCOPHAGE) 1000 MG tablet Take 1 tablet (1,000 mg total) by mouth 2 (two) times daily with a meal. 60 tablet 3  . triamterene-hydrochlorothiazide (DYAZIDE) 37.5-25 MG capsule Take 1 capsule by mouth daily.     Marland Kitchen amoxicillin (AMOXIL) 500 MG tablet Take 1 tablet (500 mg total) by mouth 2 (two) times daily. 14 tablet 0   No facility-administered medications prior to visit.     No Known Allergies  ROS Review of Systems  Constitutional: Positive for fatigue.  HENT: Negative.   Eyes: Negative.   Respiratory: Negative.   Cardiovascular: Negative.   Gastrointestinal: Positive for abdominal distention.  Endocrine: Negative.   Genitourinary: Negative.   Musculoskeletal: Negative.   Skin: Negative.   Allergic/Immunologic: Negative.   Neurological: Positive for dizziness and headaches.  Hematological: Negative.   Psychiatric/Behavioral: Negative.       Objective:    Physical Exam  Constitutional: She is oriented to person, place, and time. She appears well-developed and well-nourished.  HENT:  Head: Normocephalic and atraumatic.  Eyes: Conjunctivae are normal.  Neck: Normal range of motion. Neck supple.  Cardiovascular: Normal rate, regular rhythm, normal heart sounds and intact distal pulses.  Pulmonary/Chest: Effort normal and breath sounds normal.  Abdominal: Soft. Bowel sounds are normal. She exhibits distension (obese).  Musculoskeletal: Normal range of motion.  Neurological: She is alert and oriented to person, place, and time. She has normal reflexes.  Skin: Skin is warm and dry.  Psychiatric: She has a normal mood and affect. Her behavior is normal. Judgment and thought content normal.  Nursing note and vitals reviewed.   BP (!) 143/88 Comment: no bp meds taken today  Pulse 100   Temp 97.7 F (36.5 C) (Oral)   Ht _0  (1.778 m)   Wt (!) 365 lb 9.6 oz (165.8 kg)   SpO2 99%   BMI 52.46 kg/m  Wt Readings from Last 3 Encounters:  09/06/19 (!) 365 lb 9.6 oz (165.8 kg)  08/01/19 (!) 374 lb 3.2 oz (169.7 kg)  06/07/19 (!) 371 lb (168.3 kg)     Health Maintenance Due  Topic Date Due  . OPHTHALMOLOGY EXAM  03/23/2000  . HIV Screening  03/23/2005  . PAP-Cervical  Cytology Screening  03/24/2011  . FOOT EXAM  11/01/2018  . URINE MICROALBUMIN  05/17/2019  . INFLUENZA VACCINE  06/17/2019    There are no preventive care reminders to display for this patient.  Lab Results  Component Value Date   TSH 2.050 05/02/2018   Lab Results  Component Value Date   WBC 9.1 11/25/2018   HGB 10.2 (L) 11/25/2018   HCT 34.6 (L) 11/25/2018   MCV 65.5 (L) 11/25/2018   PLT 351 11/25/2018   Lab Results  Component Value Date   NA 138 11/25/2018   K 3.6 11/25/2018   CO2 22 11/25/2018   GLUCOSE 260 (H) 11/25/2018   BUN 8 11/25/2018  CREATININE 0.65 11/25/2018   BILITOT <0.2 05/02/2018   ALKPHOS 78 05/02/2018   AST 6 05/02/2018   ALT 10 05/02/2018   PROT 7.8 05/02/2018   ALBUMIN 4.1 05/02/2018   CALCIUM 8.8 (L) 11/25/2018   ANIONGAP 9 11/25/2018   Lab Results  Component Value Date   CHOL 141 05/02/2018   Lab Results  Component Value Date   HDL 24 (L) 05/02/2018   Lab Results  Component Value Date   LDLCALC 55 05/02/2018   Lab Results  Component Value Date   TRIG 312 (H) 05/02/2018   Lab Results  Component Value Date   CHOLHDL 5.9 (H) 05/02/2018   Lab Results  Component Value Date   HGBA1C 7.2 (A) 09/06/2019      Assessment & Plan:   1. Type 2 diabetes mellitus without complication, without long-term current use of insulin ([facility]) Improved. Hgb A1c is decreased at 7.2 today. From 10.9 on 03/07/2019. She will continue medication as prescribed, to decrease foods/beverages high in sugars and carbs and follow Heart Healthy or DASH diet. Increase physical activity to at least 30 minutes cardio exercise daily.  - POCT urinalysis dipstick - POCT glycosylated hemoglobin (Hb A1C) - POCT glucose (manual entry) - metroNIDAZOLE (FLAGYL) 500 MG tablet; Take 1 tablet (500 mg total) by mouth 2 (two) times daily for 7 days.  Dispense: 14 tablet; Refill: 0 - atorvastatin (LIPITOR) 10 MG tablet; Take 1 tablet (10 mg total) by mouth daily.  Dispense: 90  tablet; Refill: 1  2. Essential hypertension The current medical regimen is effective; blood pressure is stable at 143/88 today; continue present plan and medications as prescribed. She will continue to take medications as prescribed, to decrease high sodium intake, excessive alcohol intake, increase potassium intake, smoking cessation, and increase physical activity of at least 30 minutes of cardio activity daily. She will continue to follow Heart Healthy or DASH diet.  3. UTI symptoms We will initiate metronidazole and Diflucan today.  - metroNIDAZOLE (FLAGYL) 500 MG tablet; Take 1 tablet (500 mg total) by mouth 2 (two) times daily for 7 days.  Dispense: 14 tablet; Refill: 0 - fluconazole (DIFLUCAN) 150 MG tablet; Take 1 tablet (150 mg total) by mouth once for 1 dose.  Dispense: 1 tablet; Refill: 0  4. Vaginal itching  5. Recent weight loss 20 lb weight loss in 7 months.   6. Follow up She will follow up in 6 months.   Meds ordered this encounter  Medications  . metroNIDAZOLE (FLAGYL) 500 MG tablet    Sig: Take 1 tablet (500 mg total) by mouth 2 (two) times daily for 7 days.    Dispense:  14 tablet    Refill:  0  . fluconazole (DIFLUCAN) 150 MG tablet    Sig: Take 1 tablet (150 mg total) by mouth once for 1 dose.    Dispense:  1 tablet    Refill:  0  . atorvastatin (LIPITOR) 10 MG tablet    Sig: Take 1 tablet (10 mg total) by mouth daily.    Dispense:  90 tablet    Refill:  1    Orders Placed This Encounter  Procedures  . POCT urinalysis dipstick  . POCT glycosylated hemoglobin (Hb A1C)  . POCT glucose (manual entry)    Referral Orders  No referral(s) requested today    Kathe Becton,  MSN, FNP-BC [facility] [address] [place], [place] [address] [phone]  fax  Problem List Items Addressed This Visit      Cardiovascular and Mediastinum   HTN (hypertension)   Relevant  Medications   triamterene-hydrochlorothiazide (DYAZIDE) 37.5-25 MG capsule   atorvastatin (LIPITOR) 10 MG tablet     Endocrine   Type 2 diabetes mellitus without complication, without long-term current use of insulin ([facility]) - Primary   Relevant Medications   metroNIDAZOLE (FLAGYL) 500 MG tablet   atorvastatin (LIPITOR) 10 MG tablet   Other Relevant Orders   POCT urinalysis dipstick (Completed)   POCT glycosylated hemoglobin (Hb A1C) (Completed)   POCT glucose (manual entry) (Completed)     Musculoskeletal and Integument   Vaginal itching    Other Visit Diagnoses    UTI symptoms       Relevant Medications   metroNIDAZOLE (FLAGYL) 500 MG tablet   fluconazole (DIFLUCAN) 150 MG tablet   Follow up          Meds ordered this encounter  Medications  . metroNIDAZOLE (FLAGYL) 500 MG tablet    Sig: Take 1 tablet (500 mg total) by mouth 2 (two) times daily for 7 days.    Dispense:  14 tablet    Refill:  0  . fluconazole (DIFLUCAN) 150 MG tablet    Sig: Take 1 tablet (150 mg total) by mouth once for 1 dose.    Dispense:  1 tablet    Refill:  0  . atorvastatin (LIPITOR) 10 MG tablet    Sig: Take 1 tablet (10 mg total) by mouth daily.    Dispense:  90 tablet    Refill:  1    Follow-up: Return in about 6 months (around 03/06/2020).    Azzie Glatter, FNP

## 2019-09-06 NOTE — Patient Instructions (Addendum)
Urinary Tract Infection, Adult A urinary tract infection (UTI) is an infection of any part of the urinary tract. The urinary tract includes:  The kidneys.  The ureters.  The bladder.  The urethra. These organs make, store, and get rid of pee (urine) in the body. What are the causes? This is caused by germs (bacteria) in your genital area. These germs grow and cause swelling (inflammation) of your urinary tract. What increases the risk? You are more likely to develop this condition if:  You have a small, thin tube (catheter) to drain pee.  You cannot control when you pee or poop (incontinence).  You are female, and: ? You use these methods to prevent pregnancy: ? A medicine that kills sperm (spermicide). ? A device that blocks sperm (diaphragm). ? You have low levels of a female hormone (estrogen). ? You are pregnant.  You have genes that add to your risk.  You are sexually active.  You take antibiotic medicines.  You have trouble peeing because of: ? A prostate that is bigger than normal, if you are female. ? A blockage in the part of your body that drains pee from the bladder (urethra). ? A kidney stone. ? A nerve condition that affects your bladder (neurogenic bladder). ? Not getting enough to drink. ? Not peeing often enough.  You have other conditions, such as: ? Diabetes. ? A weak disease-fighting system (immune system). ? Sickle cell disease. ? Gout. ? Injury of the spine. What are the signs or symptoms? Symptoms of this condition include:  Needing to pee right away (urgently).  Peeing often.  Peeing small amounts often.  Pain or burning when peeing.  Blood in the pee.  Pee that smells bad or not like normal.  Trouble peeing.  Pee that is cloudy.  Fluid coming from the vagina, if you are female.  Pain in the belly or lower back. Other symptoms include:  Throwing up (vomiting).  No urge to eat.  Feeling mixed up (confused).  Being tired  and grouchy (irritable).  A fever.  Watery poop (diarrhea). How is this treated? This condition may be treated with:  Antibiotic medicine.  Other medicines.  Drinking enough water. Follow these instructions at home:  Medicines  Take over-the-counter and prescription medicines only as told by your doctor.  If you were prescribed an antibiotic medicine, take it as told by your doctor. Do not stop taking it even if you start to feel better. General instructions  Make sure you: ? Pee until your bladder is empty. ? Do not hold pee for a long time. ? Empty your bladder after sex. ? Wipe from front to back after pooping if you are a female. Use each tissue one time when you wipe.  Drink enough fluid to keep your pee pale yellow.  Keep all follow-up visits as told by your doctor. This is important. Contact a doctor if:  You do not get better after 1-2 days.  Your symptoms go away and then come back. Get help right away if:  You have very bad back pain.  You have very bad pain in your lower belly.  You have a fever.  You are sick to your stomach (nauseous).  You are throwing up. Summary  A urinary tract infection (UTI) is an infection of any part of the urinary tract.  This condition is caused by germs in your genital area.  There are many risk factors for a UTI. These include having a small, thin   tube to drain pee and not being able to control when you pee or poop.  Treatment includes antibiotic medicines for germs.  Drink enough fluid to keep your pee pale yellow. This information is not intended to replace advice given to you by your health care provider. Make sure you discuss any questions you have with your health care provider. Document Released: 04/20/2008 Document Revised: 10/20/2018 Document Reviewed: 05/12/2018 Elsevier Patient Education  2020 [employer] Metronidazole tablets or capsules What is this medicine? METRONIDAZOLE (me troe NI da zole) is  an antiinfective. It is used to treat certain kinds of bacterial and protozoal infections. It will not work for colds, flu, or other viral infections. This medicine may be used for other purposes; ask your health care provider or pharmacist if you have questions. COMMON BRAND NAME(S): Flagyl What should I tell my health care provider before I take this medicine? They need to know if you have any of these conditions:  Cockayne syndrome  history of blood diseases, like sickle cell anemia or leukemia  history of yeast infection  if you often drink alcohol  liver disease  an unusual or allergic reaction to metronidazole, nitroimidazoles, or other medicines, foods, dyes, or preservatives  pregnant or trying to get pregnant  breast-feeding How should I use this medicine? Take this medicine by mouth with a full glass of water. Follow the directions on the prescription label. Take your medicine at regular intervals. Do not take your medicine more often than directed. Take all of your medicine as directed even if you think you are better. Do not skip doses or stop your medicine early. Talk to your pediatrician regarding the use of this medicine in children. Special care may be needed. Overdosage: If you think you have taken too much of this medicine contact a poison control center or emergency room at once. NOTE: This medicine is only for you. Do not share this medicine with others. What if I miss a dose? If you miss a dose, take it as soon as you can. If it is almost time for your next dose, take only that dose. Do not take double or extra doses. What may interact with this medicine? Do not take this medicine with any of the following medications:  alcohol or any product that contains alcohol  cisapride  disulfiram  dronedarone  pimozide  thioridazine This medicine may also interact with the following medications:  amiodarone  birth control pills  busulfan  carbamazepine   cimetidine  cyclosporine  fluorouracil  lithium  other medicines that prolong the QT interval (cause an abnormal heart rhythm) like dofetilide, ziprasidone  phenobarbital  phenytoin  quinidine  tacrolimus  vecuronium  warfarin This list may not describe all possible interactions. Give your health care provider a list of all the medicines, herbs, non-prescription drugs, or dietary supplements you use. Also tell them if you smoke, drink alcohol, or use illegal drugs. Some items may interact with your medicine. What should I watch for while using this medicine? Tell your doctor or health care professional if your symptoms do not improve or if they get worse. You may get drowsy or dizzy. Do not drive, use machinery, or do anything that needs mental alertness until you know how this medicine affects you. Do not stand or sit up quickly, especially if you are an older patient. This reduces the risk of dizzy or fainting spells. Ask your doctor or health care professional if you should avoid alcohol. Many nonprescription cough and  cold products contain alcohol. Metronidazole can cause an unpleasant reaction when taken with alcohol. The reaction includes flushing, headache, nausea, vomiting, sweating, and increased thirst. The reaction can last from 30 minutes to several hours. If you are being treated for a sexually transmitted disease, avoid sexual contact until you have finished your treatment. Your sexual partner may also need treatment. What side effects may I notice from receiving this medicine? Side effects that you should report to your doctor or health care professional as soon as possible:  allergic reactions like skin rash or hives, swelling of the face, lips, or tongue  confusion  fast, irregular heartbeat  fever, chills, sore throat  fever with rash, swollen lymph nodes, or swelling of the face  pain, tingling, numbness in the hands or feet  redness, blistering, peeling  or loosening of the skin, including inside the mouth  seizures  sign and symptoms of liver injury like dark yellow or brown urine; general ill feeling or flu-like symptoms; light colored stools; loss of appetite; nausea; right upper belly pain; unusually weak or tired; yellowing of the eyes or skin  vaginal discharge, itching, or odor in women Side effects that usually do not require medical attention (report to your doctor or health care professional if they continue or are bothersome):  changes in taste  diarrhea  headache  nausea, vomiting  stomach pain This list may not describe all possible side effects. Call your doctor for medical advice about side effects. You may report side effects to FDA at 1-800-FDA-1088. Where should I keep my medicine? Keep out of the reach of children. Store at room temperature below 25 degrees C (77 degrees F). Protect from light. Keep container tightly closed. Throw away any unused medicine after the expiration date. NOTE: This sheet is a summary. It may not cover all possible information. If you have questions about this medicine, talk to your doctor, pharmacist, or health care provider.  2020 Elsevier/Gold Standard (2018-10-25 06:52:33) Fluconazole tablets What is this medicine? FLUCONAZOLE (floo KON na zole) is an antifungal medicine. It is used to treat certain kinds of fungal or yeast infections. This medicine may be used for other purposes; ask your health care provider or pharmacist if you have questions. COMMON BRAND NAME(S): Diflucan What should I tell my health care provider before I take this medicine? They need to know if you have any of these conditions:  history of irregular heart beat  kidney disease  an unusual or allergic reaction to fluconazole, other azole antifungals, medicines, foods, dyes, or preservatives  pregnant or trying to get pregnant  breast-feeding How should I use this medicine? Take this medicine by mouth.  Follow the directions on the prescription label. Do not take your medicine more often than directed. Talk to your pediatrician regarding the use of this medicine in children. Special care may be needed. This medicine has been used in children as young as 736 months of age. Overdosage: If you think you have taken too much of this medicine contact a poison control center or emergency room at once. NOTE: This medicine is only for you. Do not share this medicine with others. What if I miss a dose? If you miss a dose, take it as soon as you can. If it is almost time for your next dose, take only that dose. Do not take double or extra doses. What may interact with this medicine? Do not take this medicine with any of the following medications:  astemizole  certain  medicines for irregular heart beat like dronedarone, quinidine  cisapride  erythromycin  lomitapide  other medicines that prolong the QT interval (cause an abnormal heart rhythm)  pimozide  terfenadine  thioridazine  tolvaptan This medicine may also interact with the following medications:  antiviral medicines for HIV or AIDS  birth control pills  certain antibiotics like rifabutin, rifampin  certain medicines for blood pressure like amlodipine, isradipine, felodipine, hydrochlorothiazide, losartan, nifedipine  certain medicines for cancer like cyclophosphamide, vinblastine, vincristine  certain medicines for cholesterol like atorvastatin, lovastatin, fluvastatin, simvastatin  certain medicines for depression, anxiety, or psychotic disturbances like amitriptyline, midazolam, nortriptyline, triazolam  certain medicines for diabetes like glipizide, glyburide, tolbutamide  certain medicines for pain like alfentanil, fentanyl, methadone  certain medicines for seizures like carbamazepine, phenytoin  certain medicines that treat or prevent blood clots like warfarin  dofetilide  halofantrine  medicines that lower  your chance of fighting infection like cyclosporine, prednisone, tacrolimus  NSAIDS, medicines for pain and inflammation, like celecoxib, diclofenac, flurbiprofen, ibuprofen, meloxicam, naproxen  other medicines for fungal infections  sirolimus  theophylline  tofacitinib  ziprasidone This list may not describe all possible interactions. Give your health care provider a list of all the medicines, herbs, non-prescription drugs, or dietary supplements you use. Also tell them if you smoke, drink alcohol, or use illegal drugs. Some items may interact with your medicine. What should I watch for while using this medicine? Visit your doctor or health care professional for regular checkups. If you are taking this medicine for a long time you may need blood work. Tell your doctor if your symptoms do not improve. Some fungal infections need many weeks or months of treatment to cure. Alcohol can increase possible damage to your liver. Avoid alcoholic drinks. If you have a vaginal infection, do not have sex until you have finished your treatment. You can wear a sanitary napkin. Do not use tampons. Wear freshly washed cotton, not synthetic, panties. What side effects may I notice from receiving this medicine? Side effects that you should report to your doctor or health care professional as soon as possible:  allergic reactions like skin rash or itching, hives, swelling of the lips, mouth, tongue, or throat  dark urine  feeling dizzy or faint  irregular heartbeat or chest pain  redness, blistering, peeling or loosening of the skin, including inside the mouth  trouble breathing  unusual bruising or bleeding  vomiting  yellowing of the eyes or skin Side effects that usually do not require medical attention (report to your doctor or health care professional if they continue or are bothersome):  changes in how food tastes  diarrhea  headache  stomach upset or nausea This list may not  describe all possible side effects. Call your doctor for medical advice about side effects. You may report side effects to FDA at 1-800-FDA-1088. Where should I keep my medicine? Keep out of the reach of children. Store at room temperature below 30 degrees C (86 degrees F). Throw away any medicine after the expiration date. NOTE: This sheet is a summary. It may not cover all possible information. If you have questions about this medicine, talk to your doctor, pharmacist, or health care provider.  2020 Elsevier/Gold Standard (2018-10-24 11:58:26) Atorvastatin tablets What is this medicine? ATORVASTATIN (a TORE va sta tin) is known as a HMG-CoA reductase inhibitor or 'statin'. It lowers the level of cholesterol and triglycerides in the blood. This drug may also reduce the risk of heart attack, stroke, or  other health problems in patients with risk factors for heart disease. Diet and lifestyle changes are often used with this drug. This medicine may be used for other purposes; ask your health care provider or pharmacist if you have questions. COMMON BRAND NAME(S): Lipitor What should I tell my health care provider before I take this medicine? They need to know if you have any of these conditions:  diabetes  if you often drink alcohol  history of stroke  kidney disease  liver disease  muscle aches or weakness  thyroid disease  an unusual or allergic reaction to atorvastatin, other medicines, foods, dyes, or preservatives  pregnant or trying to get pregnant  breast-feeding How should I use this medicine? Take this medicine by mouth with a glass of water. Follow the directions on the prescription label. You can take it with or without food. If it upsets your stomach, take it with food. Do not take with grapefruit juice. Take your medicine at regular intervals. Do not take it more often than directed. Do not stop taking except on your doctor's advice. Talk to your pediatrician regarding  the use of this medicine in children. While this drug may be prescribed for children as young as 10 for selected conditions, precautions do apply. Overdosage: If you think you have taken too much of this medicine contact a poison control center or emergency room at once. NOTE: This medicine is only for you. Do not share this medicine with others. What if I miss a dose? If you miss a dose, take it as soon as you can. If your next dose is to be taken in less than 12 hours, then do not take the missed dose. Take the next dose at your regular time. Do not take double or extra doses. What may interact with this medicine? Do not take this medicine with any of the following medications:  dasabuvir; ombitasvir; paritaprevir; ritonavir  ombitasvir; paritaprevir; ritonavir  posaconazole  red yeast rice This medicine may also interact with the following medications:  alcohol  birth control pills  certain antibiotics like erythromycin and clarithromycin  certain antivirals for HIV or hepatitis  certain medicines for cholesterol like fenofibrate, gemfibrozil, and niacin  certain medicines for fungal infections like ketoconazole and itraconazole  colchicine  cyclosporine  digoxin  grapefruit juice  rifampin This list may not describe all possible interactions. Give your health care provider a list of all the medicines, herbs, non-prescription drugs, or dietary supplements you use. Also tell them if you smoke, drink alcohol, or use illegal drugs. Some items may interact with your medicine. What should I watch for while using this medicine? Visit your doctor or health care professional for regular check-ups. You may need regular tests to make sure your liver is working properly. Your health care professional may tell you to stop taking this medicine if you develop muscle problems. If your muscle problems do not go away after stopping this medicine, contact your health care professional. Do  not become pregnant while taking this medicine. Women should inform their health care professional if they wish to become pregnant or think they might be pregnant. There is a potential for serious side effects to an unborn child. Talk to your health care professional or pharmacist for more information. Do not breast-feed an infant while taking this medicine. This medicine may increase blood sugar. Ask your healthcare provider if changes in diet or medicines are needed if you have diabetes. If you are going to need surgery or  other procedure, tell your doctor that you are using this medicine. This drug is only part of a total heart-health program. Your doctor or a dietician can suggest a low-cholesterol and low-fat diet to help. Avoid alcohol and smoking, and keep a proper exercise schedule. This medicine may cause a decrease in Co-Enzyme Q-10. You should make sure that you get enough Co-Enzyme Q-10 while you are taking this medicine. Discuss the foods you eat and the vitamins you take with your health care professional. What side effects may I notice from receiving this medicine? Side effects that you should report to your doctor or health care professional as soon as possible:  allergic reactions like skin rash, itching or hives, swelling of the face, lips, or tongue  fever  joint pain  loss of memory  redness, blistering, peeling or loosening of the skin, including inside the mouth  signs and symptoms of high blood sugar such as being more thirsty or hungry or having to urinate more than normal. You may also feel very tired or have blurry vision.  signs and symptoms of liver injury like dark yellow or brown urine; general ill feeling or flu-like symptoms; light-belly pain; unusually weak or tired; yellowing of the eyes or skin  signs and symptoms of muscle injury like dark urine; trouble passing urine or change in the amount of urine; unusually weak or tired; muscle pain or side or back pain  Side effects that usually do not require medical attention (report to your doctor or health care professional if they continue or are bothersome):  diarrhea  nausea  stomach pain  trouble sleeping  upset stomach This list may not describe all possible side effects. Call your doctor for medical advice about side effects. You may report side effects to FDA at 1-800-FDA-1088. Where should I keep my medicine? Keep out of the reach of children. Store between 20 and 25 degrees C (68 and 77 degrees F). Throw away any unused medicine after the expiration date. NOTE: This sheet is a summary. It may not cover all possible information. If you have questions about this medicine, talk to your doctor, pharmacist, or health care provider.  2020 Elsevier/Gold Standard (2018-08-24 11:36:16)

## 2019-09-12 ENCOUNTER — Other Ambulatory Visit: Payer: Self-pay | Admitting: Family Medicine

## 2019-09-12 DIAGNOSIS — E119 Type 2 diabetes mellitus without complications: Secondary | ICD-10-CM

## 2019-09-18 ENCOUNTER — Other Ambulatory Visit: Payer: Self-pay | Admitting: Family Medicine

## 2019-09-19 ENCOUNTER — Telehealth: Payer: Self-pay | Admitting: Family Medicine

## 2019-09-19 ENCOUNTER — Other Ambulatory Visit: Payer: Self-pay | Admitting: Family Medicine

## 2019-09-19 DIAGNOSIS — R399 Unspecified symptoms and signs involving the genitourinary system: Secondary | ICD-10-CM

## 2019-09-19 DIAGNOSIS — E119 Type 2 diabetes mellitus without complications: Secondary | ICD-10-CM

## 2019-09-19 MED ORDER — FLUCONAZOLE 150 MG PO TABS: 150.0000 mg | ORAL_TABLET | Freq: Once | ORAL | 0 refills | Status: AC

## 2019-09-19 MED ORDER — METRONIDAZOLE 500 MG PO TABS: 500.0000 mg | ORAL_TABLET | Freq: Two times a day (BID) | ORAL | 0 refills | Status: AC

## 2019-09-20 NOTE — Telephone Encounter (Signed)
done

## 2019-10-05 ENCOUNTER — Telehealth: Payer: Self-pay | Admitting: Family Medicine

## 2019-10-05 NOTE — Telephone Encounter (Signed)
Grace Daniels still has vaginal discomfort, itching, burning, and brownish discharge on tissue after wiping. The Rx Flagyl and Diflucan did not cure the problem.   (If an office visit is need she can only come in the PM).  Call back ph [phone].    Please advise.

## 2019-10-06 ENCOUNTER — Other Ambulatory Visit (INDEPENDENT_AMBULATORY_CARE_PROVIDER_SITE_OTHER): Payer: BC Managed Care – PPO

## 2019-10-06 ENCOUNTER — Other Ambulatory Visit: Payer: BC Managed Care – PPO

## 2019-10-06 DIAGNOSIS — R399 Unspecified symptoms and signs involving the genitourinary system: Secondary | ICD-10-CM

## 2019-10-06 DIAGNOSIS — E119 Type 2 diabetes mellitus without complications: Secondary | ICD-10-CM

## 2019-10-06 DIAGNOSIS — N898 Other specified noninflammatory disorders of vagina: Secondary | ICD-10-CM | POA: Diagnosis not present

## 2019-10-06 DIAGNOSIS — R8290 Unspecified abnormal findings in urine: Secondary | ICD-10-CM

## 2019-10-06 LAB — POCT URINALYSIS DIPSTICK
Bilirubin, UA: NEGATIVE
Blood, UA: NEGATIVE
Glucose, UA: POSITIVE — AB
Ketones, UA: NEGATIVE
Nitrite, UA: NEGATIVE
Protein, UA: NEGATIVE
Spec Grav, UA: 1.02 (ref 1.010–1.025)
Urobilinogen, UA: 2 E.U./dL — AB
pH, UA: 7 (ref 5.0–8.0)

## 2019-10-07 LAB — COMPREHENSIVE METABOLIC PANEL
ALT: 13 IU/L (ref 0–32)
AST: 13 IU/L (ref 0–40)
Albumin/Globulin Ratio: 1.1 — ABNORMAL LOW (ref 1.2–2.2)
Albumin: 4.5 g/dL (ref 3.9–5.0)
Alkaline Phosphatase: 75 IU/L (ref 39–117)
BUN/Creatinine Ratio: 12 (ref 9–23)
BUN: 10 mg/dL (ref 6–20)
Bilirubin Total: <0.2 mg/dL (ref 0.0–1.2)
CO2: 18 mmol/L — ABNORMAL LOW (ref 20–29)
Calcium: 10.5 mg/dL — ABNORMAL HIGH (ref 8.7–10.2)
Chloride: 96 mmol/L (ref 96–106)
Creatinine, Ser: 0.84 mg/dL (ref 0.57–1.00)
GFR calc Af Amer: 109 mL/min/{1.73_m2} (ref >59)
GFR calc non Af Amer: 94 mL/min/{1.73_m2} (ref >59)
Globulin, Total: 4 g/dL (ref 1.5–4.5)
Glucose: 90 mg/dL (ref 65–99)
Potassium: 4.5 mmol/L (ref 3.5–5.2)
Sodium: 138 mmol/L (ref 134–144)
Total Protein: 8.5 g/dL (ref 6.0–8.5)

## 2019-10-09 ENCOUNTER — Telehealth: Payer: Self-pay

## 2019-10-09 LAB — URINE CULTURE

## 2019-10-09 NOTE — Telephone Encounter (Signed)
Sharalyn Lomba called back for lab results done on 10/05/2020. Please advise.

## 2019-10-10 ENCOUNTER — Other Ambulatory Visit: Payer: Self-pay | Admitting: Family Medicine

## 2019-10-10 DIAGNOSIS — N390 Urinary tract infection, site not specified: Secondary | ICD-10-CM

## 2019-10-10 MED ORDER — SULFAMETHOXAZOLE-TRIMETHOPRIM 800-160 MG PO TABS: 1.0000 | ORAL_TABLET | Freq: Two times a day (BID) | ORAL | 0 refills | Status: DC

## 2019-10-11 LAB — NUSWAB VAGINITIS PLUS (VG+)
Candida albicans, NAA: POSITIVE — AB
Candida glabrata, NAA: NEGATIVE
Chlamydia trachomatis, NAA: NEGATIVE
Megasphaera 1: HIGH Score — AB
Neisseria gonorrhoeae, NAA: NEGATIVE
Trich vag by NAA: NEGATIVE

## 2019-10-16 ENCOUNTER — Other Ambulatory Visit: Payer: Self-pay | Admitting: Family Medicine

## 2019-10-16 ENCOUNTER — Telehealth: Payer: Self-pay

## 2019-10-16 DIAGNOSIS — B3749 Other urogenital candidiasis: Secondary | ICD-10-CM

## 2019-10-16 DIAGNOSIS — B379 Candidiasis, unspecified: Secondary | ICD-10-CM

## 2019-10-16 DIAGNOSIS — B9689 Other specified bacterial agents as the cause of diseases classified elsewhere: Secondary | ICD-10-CM

## 2019-10-16 MED ORDER — CLINDAMYCIN HCL 300 MG PO CAPS: 300.0000 mg | ORAL_CAPSULE | Freq: Two times a day (BID) | ORAL | 0 refills | Status: AC

## 2019-10-16 MED ORDER — FLUCONAZOLE 150 MG PO TABS: 150.0000 mg | ORAL_TABLET | Freq: Once | ORAL | 0 refills | Status: AC

## 2019-10-16 NOTE — Telephone Encounter (Signed)
Patient called back and I informed of positive BV and that rx's for flagyl and clindamycin have been sent to pharmacy. Asked that she does not mix alcohol with these medications and went over suggestions to help prevent BV. Thanks!

## 2019-10-18 ENCOUNTER — Telehealth: Payer: Self-pay | Admitting: Family Medicine

## 2019-10-18 DIAGNOSIS — N760 Acute vaginitis: Secondary | ICD-10-CM | POA: Diagnosis not present

## 2019-10-19 NOTE — Telephone Encounter (Signed)
done

## 2019-12-05 ENCOUNTER — Other Ambulatory Visit: Payer: Self-pay | Admitting: Family Medicine

## 2019-12-05 DIAGNOSIS — I10 Essential (primary) hypertension: Secondary | ICD-10-CM

## 2019-12-20 ENCOUNTER — Ambulatory Visit (INDEPENDENT_AMBULATORY_CARE_PROVIDER_SITE_OTHER): Payer: BC Managed Care – PPO | Admitting: Family Medicine

## 2019-12-20 ENCOUNTER — Other Ambulatory Visit: Payer: Self-pay

## 2019-12-20 DIAGNOSIS — R399 Unspecified symptoms and signs involving the genitourinary system: Secondary | ICD-10-CM | POA: Diagnosis not present

## 2019-12-20 DIAGNOSIS — N760 Acute vaginitis: Secondary | ICD-10-CM

## 2019-12-20 DIAGNOSIS — B9689 Other specified bacterial agents as the cause of diseases classified elsewhere: Secondary | ICD-10-CM | POA: Diagnosis not present

## 2019-12-20 LAB — POCT URINALYSIS DIPSTICK
Bilirubin, UA: NEGATIVE
Glucose, UA: POSITIVE — AB
Ketones, UA: 15
Leukocytes, UA: NEGATIVE
Nitrite, UA: NEGATIVE
Protein, UA: NEGATIVE
Spec Grav, UA: 1.025 (ref 1.010–1.025)
Urobilinogen, UA: 0.2 E.U./dL
pH, UA: 5.5 (ref 5.0–8.0)

## 2019-12-20 NOTE — Progress Notes (Signed)
Patient c/o vaginal itching & vaginal discharge.   Lab only visit per Dorene Grebe NP

## 2019-12-22 ENCOUNTER — Telehealth: Payer: Self-pay

## 2019-12-22 NOTE — Telephone Encounter (Signed)
Patient called for recent lab results

## 2019-12-24 LAB — NUSWAB VAGINITIS PLUS (VG+)
Candida albicans, NAA: POSITIVE — AB
Candida glabrata, NAA: NEGATIVE
Chlamydia trachomatis, NAA: NEGATIVE
Neisseria gonorrhoeae, NAA: NEGATIVE
Trich vag by NAA: NEGATIVE

## 2019-12-25 ENCOUNTER — Ambulatory Visit (INDEPENDENT_AMBULATORY_CARE_PROVIDER_SITE_OTHER): Payer: BC Managed Care – PPO | Admitting: Family Medicine

## 2019-12-25 ENCOUNTER — Encounter: Payer: Self-pay | Admitting: Family Medicine

## 2019-12-25 ENCOUNTER — Other Ambulatory Visit: Payer: Self-pay

## 2019-12-25 VITALS — BP 133/86 | HR 109 | Temp 97.8°F | Ht 70.0 in | Wt 363.6 lb

## 2019-12-25 DIAGNOSIS — I10 Essential (primary) hypertension: Secondary | ICD-10-CM

## 2019-12-25 DIAGNOSIS — E119 Type 2 diabetes mellitus without complications: Secondary | ICD-10-CM | POA: Diagnosis not present

## 2019-12-25 DIAGNOSIS — R7309 Other abnormal glucose: Secondary | ICD-10-CM | POA: Diagnosis not present

## 2019-12-25 DIAGNOSIS — Z09 Encounter for follow-up examination after completed treatment for conditions other than malignant neoplasm: Secondary | ICD-10-CM

## 2019-12-25 DIAGNOSIS — B379 Candidiasis, unspecified: Secondary | ICD-10-CM | POA: Diagnosis not present

## 2019-12-25 DIAGNOSIS — R739 Hyperglycemia, unspecified: Secondary | ICD-10-CM

## 2019-12-25 LAB — POCT GLYCOSYLATED HEMOGLOBIN (HGB A1C): Hemoglobin A1C: 7.6 % — AB (ref 4.0–5.6)

## 2019-12-25 LAB — GLUCOSE, POCT (MANUAL RESULT ENTRY): POC Glucose: 160 mg/dl — AB (ref 70–99)

## 2019-12-25 MED ORDER — FLUCONAZOLE 150 MG PO TABS: ORAL_TABLET | ORAL | 0 refills | Status: DC

## 2019-12-25 NOTE — Progress Notes (Signed)
Patient [facility]    Established Patient Office Visit  Subjective:  Patient ID: Grace Daniels, female    DOB: Mar 29, 1991  Age: 29 y.o. MRN: [id number]  CC:  Chief Complaint  Patient presents with  . Follow-up    lab follow up vist-vaginal itching & burning    HPI Grace Daniels is a 29 year old female who presents for Follow Up today.   Past Medical History:  Diagnosis Date  . Diabetes ([place])   . Hyperlipidemia   . Hypertension   . Morbid obesity ([place])   . Sleep apnea    Current Status: Since her last office visit, she is doing well with no complaints. She has seen low range of 110 and high of 150 since his last office visit. She denies fatigue, frequent urination, blurred vision, excessive hunger, excessive thirst, weight gain, weight loss, and poor wound healing. She continues to check her feet regularly.  She denies visual changes, chest pain, cough, shortness of breath, heart palpitations, and falls. She has occasional headaches and dizziness with position changes. Denies severe headaches, confusion, seizures, double vision, and blurred vision, nausea and vomiting. She denies fevers, chills, recent infections, weight loss, and night sweats. She has not had any headaches, visual changes, dizziness, and falls.No reports of GI problems such as diarrhea, and constipation . She has no reports of blood in stools, dysuria and hematuria. No depression or anxiety, and denies suicidal ideations, homicidal ideations, or auditory hallucinations. She denies pain today.   History reviewed. No pertinent surgical history.  History reviewed. No pertinent family history.  Social History   Socioeconomic History  . Marital status: Single    Spouse name: Not on file  . Number of children: Not on file  . Years of education: Not on file  . Highest education level: Not on file  Occupational History  . Not on file  Tobacco Use  . Smoking status:  Never Smoker  . Smokeless tobacco: Never Used  Substance and Sexual Activity  . Alcohol use: No  . Drug use: No  . Sexual activity: Not Currently  Other Topics Concern  . Not on file  Social History Narrative  . Not on file   Social Determinants of Health   Financial Resource Strain:   . Difficulty of Paying Living Expenses: Not on file  Food Insecurity:   . Worried About Charity fundraiser in the Last Year: Not on file  . Ran Out of Food in the Last Year: Not on file  Transportation Needs:   . Lack of Transportation (Medical): Not on file  . Lack of Transportation (Non-Medical): Not on file  Physical Activity:   . Days of Exercise per Week: Not on file  . Minutes of Exercise per Session: Not on file  Stress:   . Feeling of Stress : Not on file  Social Connections:   . Frequency of Communication with Friends and Family: Not on file  . Frequency of Social Gatherings with Friends and Family: Not on file  . Attends Religious Services: Not on file  . Active Member of Clubs or Organizations: Not on file  . Attends Archivist Meetings: Not on file  . Marital Status: Not on file  Intimate Partner Violence:   . Fear of Current or Ex-Partner: Not on file  . Emotionally Abused: Not on file  . Physically Abused: Not on file  . Sexually Abused: Not on file  Outpatient Medications Prior to Visit  Medication Sig Dispense Refill  . atorvastatin (LIPITOR) 10 MG tablet Take 1 tablet (10 mg total) by mouth daily. 90 tablet 1  . blood glucose meter kit and supplies KIT Dispense based on patient and insurance preference. Use up to four times daily as directed. (FOR ICD-9 250.00, 250.01). 1 each 0  . Blood Glucose Monitoring Suppl (ACURA BLOOD GLUCOSE METER) w/Device KIT 1 applicator by Does not apply route 4 (four) times daily -  before meals and at bedtime. 1 kit 1  . FARXIGA 10 MG TABS tablet TAKE 1 TABLET BY MOUTH EVERY DAY 30 tablet 2  . glipiZIDE (GLUCOTROL) 10 MG tablet  TAKE 1 TABLET BY MOUTH TWICE A DAY 60 tablet 3  . glucose blood test strip Use as instructed 100 each 12  . metFORMIN (GLUCOPHAGE) 1000 MG tablet TAKE 1 TABLET (1,000 MG TOTAL) BY MOUTH 2 (TWO) TIMES DAILY WITH A MEAL. 60 tablet 3  . sulfamethoxazole-trimethoprim (BACTRIM DS) 800-160 MG tablet Take 1 tablet by mouth 2 (two) times daily. 14 tablet 0  . triamterene-hydrochlorothiazide (DYAZIDE) 37.5-25 MG capsule TAKE 1 EACH (1 CAPSULE TOTAL) BY MOUTH DAILY. 30 capsule 8   No facility-administered medications prior to visit.   No Known Allergies  ROS Review of Systems  Constitutional: Negative.   HENT: Negative.   Eyes: Negative.   Respiratory: Negative.   Cardiovascular: Negative.   Gastrointestinal: Positive for abdominal distention.  Endocrine: Positive for polydipsia.  Genitourinary: Negative.   Musculoskeletal: Negative.   Skin: Negative.   Allergic/Immunologic: Negative.   Neurological: Negative.   Hematological: Negative.   Psychiatric/Behavioral: Negative.    Objective:    Physical Exam  Constitutional: She is oriented to person, place, and time. She appears well-developed and well-nourished.  HENT:  Head: Normocephalic and atraumatic.  Eyes: Conjunctivae are normal.  Cardiovascular: Normal rate, regular rhythm and intact distal pulses.  Pulmonary/Chest: Effort normal and breath sounds normal.  Abdominal: Soft. Bowel sounds are normal. She exhibits distension (obese).  Musculoskeletal:        General: Normal range of motion.     Cervical back: Normal range of motion and neck supple.  Neurological: She is alert and oriented to person, place, and time. She has normal reflexes.  Skin: Skin is warm and dry.  Psychiatric: She has a normal mood and affect. Her behavior is normal. Judgment and thought content normal.  Nursing note and vitals reviewed.   BP 133/86   Pulse (!) 109   Temp 97.8 F (36.6 C) (Oral)   Ht '5\' 10"'$  (1.778 m)   Wt (!) 363 lb 9.6 oz (164.9 kg)    SpO2 100%   BMI 52.17 kg/m  Wt Readings from Last 3 Encounters:  12/25/19 (!) 363 lb 9.6 oz (164.9 kg)  09/06/19 (!) 365 lb 9.6 oz (165.8 kg)  08/01/19 (!) 374 lb 3.2 oz (169.7 kg)     Health Maintenance Due  Topic Date Due  . OPHTHALMOLOGY EXAM  03/23/2000  . HIV Screening  03/23/2005  . PAP-Cervical Cytology Screening  03/24/2011  . FOOT EXAM  11/01/2018  . URINE MICROALBUMIN  05/17/2019  . INFLUENZA VACCINE  06/17/2019    There are no preventive care reminders to display for this patient.  Lab Results  Component Value Date   TSH 2.050 05/02/2018   Lab Results  Component Value Date   WBC 9.1 11/25/2018   HGB 10.2 (L) 11/25/2018   HCT 34.6 (L) 11/25/2018   MCV  65.5 (L) 11/25/2018   PLT 351 11/25/2018   Lab Results  Component Value Date   NA 138 10/06/2019   K 4.5 10/06/2019   CO2 18 (L) 10/06/2019   GLUCOSE 90 10/06/2019   BUN 10 10/06/2019   CREATININE 0.84 10/06/2019   BILITOT <0.2 10/06/2019   ALKPHOS 75 10/06/2019   AST 13 10/06/2019   ALT 13 10/06/2019   PROT 8.5 10/06/2019   ALBUMIN 4.5 10/06/2019   CALCIUM 10.5 (H) 10/06/2019   ANIONGAP 9 11/25/2018   Lab Results  Component Value Date   CHOL 141 05/02/2018   Lab Results  Component Value Date   HDL 24 (L) 05/02/2018   Lab Results  Component Value Date   LDLCALC 55 05/02/2018   Lab Results  Component Value Date   TRIG 312 (H) 05/02/2018   Lab Results  Component Value Date   CHOLHDL 5.9 (H) 05/02/2018   Lab Results  Component Value Date   HGBA1C 7.6 (A) 12/25/2019      Assessment & Plan:   1. Type 2 diabetes mellitus without complication, without long-term current use of insulin (HCC) She will continue medication as prescribed, to decrease foods/beverages high in sugars and carbs and follow Heart Healthy or DASH diet. Increase physical activity to at least 30 minutes cardio exercise daily.  - POCT glucose (manual entry) - POCT glycosylated hemoglobin (Hb A1C)  2. Hemoglobin A1c  between 7.0% and 9.0% Stable. Hgb A1c at 7.6 today. Monitor.   3. Essential hypertension The current medical regimen is effective; blood pressure is stable at 133/86 today; continue present plan and medications as prescribed. She will continue to take medications as prescribed, to decrease high sodium intake, excessive alcohol intake, increase potassium intake, smoking cessation, and increase physical activity of at least 30 minutes of cardio activity daily. She will continue to follow Heart Healthy or DASH diet.  4. Yeast infection We will initiate Fluconazole today.  - fluconazole (DIFLUCAN) 150 MG tablet; Take 1 tablet once. May repeat if symptoms return.  Dispense: 2 tablet; Refill: 0  5. Follow up She will follow up in 6 months.   Meds ordered this encounter  Medications  . fluconazole (DIFLUCAN) 150 MG tablet    Sig: Take 1 tablet once. May repeat if symptoms return.    Dispense:  2 tablet    Refill:  0   Orders Placed This Encounter  Procedures  . POCT glucose (manual entry)  . POCT glycosylated hemoglobin (Hb A1C)    Referral Orders  No referral(s) requested today    Kathe Becton,  MSN, FNP-BC [address], [place] [phone] fax  Problem List Items Addressed This Visit      Cardiovascular and Mediastinum   HTN (hypertension)     Endocrine   Type 2 diabetes mellitus without complication, without long-term current use of insulin ([place]) - Primary   Relevant Orders   POCT glucose (manual entry) (Completed)   POCT glycosylated hemoglobin (Hb A1C) (Completed)     Other   Yeast infection   Relevant Medications   fluconazole (DIFLUCAN) 150 MG tablet    Other Visit Diagnoses    Hemoglobin A1c between 7.0% and 9.0%       Follow up          Meds ordered this encounter  Medications  . fluconazole (DIFLUCAN) 150 MG tablet    Sig: Take 1 tablet  once. May repeat if symptoms return.    Dispense:  2 tablet    Refill:  0    Follow-up: Return in about 6 months (around 06/23/2020).    Azzie Glatter, FNP

## 2020-01-08 ENCOUNTER — Other Ambulatory Visit: Payer: Self-pay | Admitting: Family Medicine

## 2020-01-08 DIAGNOSIS — E119 Type 2 diabetes mellitus without complications: Secondary | ICD-10-CM

## 2020-02-08 ENCOUNTER — Other Ambulatory Visit: Payer: Self-pay | Admitting: Family Medicine

## 2020-02-19 ENCOUNTER — Ambulatory Visit (HOSPITAL_COMMUNITY)
Admission: EM | Admit: 2020-02-19 | Discharge: 2020-02-19 | Disposition: A | Discharge status: Home / Self Care | Payer: BC Managed Care – PPO | Attending: Internal Medicine | Admitting: Internal Medicine

## 2020-02-19 ENCOUNTER — Other Ambulatory Visit: Payer: Self-pay

## 2020-02-19 ENCOUNTER — Encounter (HOSPITAL_COMMUNITY): Payer: Self-pay | Admitting: Emergency Medicine

## 2020-02-19 DIAGNOSIS — J302 Other seasonal allergic rhinitis: Secondary | ICD-10-CM | POA: Insufficient documentation

## 2020-02-19 DIAGNOSIS — J029 Acute pharyngitis, unspecified: Secondary | ICD-10-CM

## 2020-02-19 LAB — POCT RAPID STREP A: Streptococcus, Group A Screen (Direct): NEGATIVE

## 2020-02-19 MED ORDER — CETIRIZINE HCL 10 MG PO TABS: 10.0000 mg | ORAL_TABLET | Freq: Every day | ORAL | 2 refills | Status: DC

## 2020-02-19 MED ORDER — BENZONATATE 100 MG PO CAPS: 100.0000 mg | ORAL_CAPSULE | Freq: Three times a day (TID) | ORAL | 0 refills | Status: DC

## 2020-02-19 NOTE — ED Triage Notes (Signed)
Pt here for cough and hoarse voice x 2 weeks; pt sts some congestion as well; pt have negative covid test three days ago; pt sts sore throat

## 2020-02-19 NOTE — ED Notes (Signed)
Accessed patient's chart to obtain orders for the throat culture

## 2020-02-21 NOTE — ED Provider Notes (Signed)
[place]    CSN: [id number] Arrival date & time: 02/19/20  1400      History   Chief Complaint Chief Complaint  Patient presents with  . Cough    HPI Grace Daniels is a 29 y.o. female comes to urgent care with cough and progressive hoarseness of voice of 2 weeks duration.  Patient symptoms started with nasal congestion and runny nose.  She subsequently started having sore throat and cough which is not productive of sputum.  She started experiencing some hoarseness of voice.  No chest pain or chest pressure.  No lower extremity swelling.  No cold or heat intolerance.  No weight change.  No sick contacts.  Patient tested -3 days ago for COVID-19 infection.  No dizziness, near syncope or syncopal episodes.   HPI  Past Medical History:  Diagnosis Date  . Diabetes ([place])   . Hyperlipidemia   . Hypertension   . Morbid obesity ([place])   . Sleep apnea     Patient Active Problem List   Diagnosis Date Noted  . Vaginal itching 08/01/2019  . Vaginal burning 08/01/2019  . Yeast infection 06/07/2019  . Hemoglobin A1C greater than 9%, indicating poor diabetic control 05/14/2019  . Type 2 diabetes mellitus without complication, without long-term current use of insulin ([place]) 03/08/2019  . Elevated glucose 03/08/2019  . Recent weight loss 03/08/2019  . Obstructive sleep apnea 08/02/2017  . Morbid obesity due to excess calories ([place]) 08/02/2017  . HTN (hypertension) 04/26/2017  . Abnormal uterine bleeding (AUB) 03/30/2016    History reviewed. No pertinent surgical history.  OB History    Gravida  0   Para      Term      Preterm      AB      Living  0     SAB      TAB      Ectopic      Multiple      Live Births               Home Medications    Prior to Admission medications   Medication Sig Start Date End Date Taking? Authorizing Provider  atorvastatin (LIPITOR) 10 MG tablet Take 1 tablet (10 mg total) by mouth daily. 09/06/19   Azzie Glatter, FNP  benzonatate (TESSALON) 100 MG capsule Take 1 capsule (100 mg total) by mouth every 8 (eight) hours. 02/19/20   [place] Witz, Myrene Galas, MD  blood glucose meter kit and supplies KIT Dispense based on patient and insurance preference. Use up to four times daily as directed. (FOR ICD-9 250.00, 250.01). 01/27/19   Lanae Boast, FNP  Blood Glucose Monitoring Suppl [facility] BLOOD GLUCOSE METER) w/Device KIT 1 applicator by Does not apply route 4 (four) times daily -  before meals and at bedtime. 05/16/18   Azzie Glatter, FNP  cetirizine (ZYRTEC ALLERGY) 10 MG tablet Take 1 tablet (10 mg total) by mouth daily. 02/19/20   Chase Picket, MD  FARXIGA 10 MG TABS tablet TAKE 1 TABLET BY MOUTH EVERY DAY 01/08/20   Azzie Glatter, FNP  glipiZIDE (GLUCOTROL) 10 MG tablet TAKE 1 TABLET BY MOUTH TWICE A DAY 02/08/20   Azzie Glatter, FNP  glucose blood test strip Use as instructed 05/16/18   Azzie Glatter, FNP  metFORMIN (GLUCOPHAGE) 1000 MG tablet TAKE 1 TABLET (1,000 MG TOTAL) BY MOUTH 2 (TWO) TIMES DAILY WITH A MEAL. 02/08/20 02/07/21  Azzie Glatter, FNP  triamterene-hydrochlorothiazide (DYAZIDE) 37.5-25 MG capsule TAKE 1 EACH (1 CAPSULE TOTAL) BY MOUTH DAILY. 12/05/19   Azzie Glatter, FNP    Family History History reviewed. No pertinent family history.  Social History Social History   Tobacco Use  . Smoking status: Never Smoker  . Smokeless tobacco: Never Used  Substance Use Topics  . Alcohol use: No  . Drug use: No     Allergies   Patient has no known allergies.   Review of Systems Review of Systems  Constitutional: Positive for activity change. Negative for chills, fatigue and fever.  HENT: Positive for congestion, rhinorrhea, sore throat and voice change. Negative for sinus pressure and sinus pain.   Eyes: Negative for pain, discharge and itching.  Respiratory: Positive for cough and shortness of breath. Negative for wheezing.   Cardiovascular: Negative.     Gastrointestinal: Negative.   Genitourinary: Negative.   Musculoskeletal: Negative.   Neurological: Negative.      Physical Exam Triage Vital Signs ED Triage Vitals [02/19/20 1452]  Enc Vitals Group     BP (!) 156/105     Pulse Rate (!) 102     Resp 18     Temp 98.2 F (36.8 C)     Temp Source Oral     SpO2 97 %     Weight      Height      Head Circumference      Peak Flow      Pain Score 5     Pain Loc      Pain Edu?      Excl. in [place]?    No data found.  Updated Vital Signs BP (!) 156/105 (BP Location: Right Arm)   Pulse (!) 102   Temp 98.2 F (36.8 C) (Oral)   Resp 18   SpO2 97%   Visual Acuity Right Eye Distance:   Left Eye Distance:   Bilateral Distance:    Right Eye Near:   Left Eye Near:    Bilateral Near:     Physical Exam Vitals and nursing note reviewed.  Constitutional:      General: She is not in acute distress.    Appearance: Normal appearance. She is not ill-appearing.  HENT:     Right Ear: Tympanic membrane normal.     Left Ear: Tympanic membrane normal.     Nose: Congestion and rhinorrhea present.     Mouth/Throat:     Mouth: Mucous membranes are moist.     Pharynx: Posterior oropharyngeal erythema present.  Eyes:     General:        Right eye: No discharge.        Left eye: No discharge.     Extraocular Movements: Extraocular movements intact.     Pupils: Pupils are equal, round, and reactive to light.  Cardiovascular:     Rate and Rhythm: Normal rate and regular rhythm.     Pulses: Normal pulses.     Heart sounds: No murmur. No friction rub.  Pulmonary:     Effort: Pulmonary effort is normal. No respiratory distress.     Breath sounds: Normal breath sounds. No wheezing or rhonchi.  Abdominal:     General: Bowel sounds are normal.     Palpations: Abdomen is soft. There is no mass.     Tenderness: There is no abdominal tenderness.     Hernia: No hernia is present.  Musculoskeletal:     Cervical back: Normal range of motion.  No  rigidity.  Lymphadenopathy:     Cervical: No cervical adenopathy.  Skin:    Capillary Refill: Capillary refill takes less than 2 seconds.  Neurological:     Mental Status: She is alert.      UC Treatments / Results  Labs (all labs ordered are listed, but only abnormal results are displayed) Labs Reviewed  CULTURE, GROUP A STREP [facility])  POCT RAPID STREP A    EKG   Radiology No results found.  Procedures Procedures (including critical care time)  Medications Ordered in UC Medications - No data to display  Initial Impression / Assessment and Plan / UC Course  I have reviewed the triage vital signs and the nursing notes.  Pertinent labs & imaging results that were available during my care of the patient were reviewed by me and considered in my medical decision making (see chart for details).     1.  Seasonal allergies with cough: Tessalon Perles as needed for cough Zyrtec 10 mg orally daily Imaging not indicated Patient has elevated blood pressure and tachycardia likely secondary to persistent cough during the evaluation Return precautions given. Final Clinical Impressions(s) / UC Diagnoses   Final diagnoses:  Seasonal allergies   Discharge Instructions   None    ED Prescriptions    Medication Sig Dispense Auth. Provider   cetirizine (ZYRTEC ALLERGY) 10 MG tablet Take 1 tablet (10 mg total) by mouth daily. 30 tablet Jaylie Neaves, Myrene Galas, MD   benzonatate (TESSALON) 100 MG capsule Take 1 capsule (100 mg total) by mouth every 8 (eight) hours. 30 capsule Jwan Hornbaker, Myrene Galas, MD     PDMP not reviewed this encounter.   Chase Picket, MD 02/21/20 1451

## 2020-02-23 LAB — CULTURE, GROUP A STREP (THRC)

## 2020-03-06 ENCOUNTER — Ambulatory Visit: Payer: BC Managed Care – PPO | Admitting: Family Medicine

## 2020-04-24 ENCOUNTER — Telehealth: Payer: Self-pay | Admitting: Family Medicine

## 2020-04-24 ENCOUNTER — Encounter (HOSPITAL_COMMUNITY): Payer: Self-pay

## 2020-04-24 ENCOUNTER — Other Ambulatory Visit: Payer: Self-pay

## 2020-04-24 ENCOUNTER — Emergency Department (HOSPITAL_COMMUNITY)
Admission: EM | Admit: 2020-04-24 | Discharge: 2020-04-24 | Disposition: A | Discharge status: Home / Self Care | Payer: 59 | Attending: Emergency Medicine | Admitting: Emergency Medicine

## 2020-04-24 DIAGNOSIS — L299 Pruritus, unspecified: Secondary | ICD-10-CM | POA: Diagnosis present

## 2020-04-24 DIAGNOSIS — Z79899 Other long term (current) drug therapy: Secondary | ICD-10-CM | POA: Diagnosis not present

## 2020-04-24 DIAGNOSIS — I10 Essential (primary) hypertension: Secondary | ICD-10-CM

## 2020-04-24 DIAGNOSIS — N760 Acute vaginitis: Secondary | ICD-10-CM | POA: Insufficient documentation

## 2020-04-24 DIAGNOSIS — E119 Type 2 diabetes mellitus without complications: Secondary | ICD-10-CM | POA: Insufficient documentation

## 2020-04-24 DIAGNOSIS — B3731 Acute candidiasis of vulva and vagina: Secondary | ICD-10-CM

## 2020-04-24 DIAGNOSIS — Z7984 Long term (current) use of oral hypoglycemic drugs: Secondary | ICD-10-CM | POA: Diagnosis not present

## 2020-04-24 MED ORDER — FLUCONAZOLE 150 MG PO TABS: 150.0000 mg | ORAL_TABLET | Freq: Every day | ORAL | 0 refills | Status: DC

## 2020-04-24 NOTE — ED Provider Notes (Signed)
[place] DEPT Provider Note   CSN: [id number] Arrival date & time: 04/24/20  0540   History Chief Complaint  Patient presents with   Vaginal Itching    Grace Daniels is a 29 y.o. female.  The history is provided by the patient.  Vaginal Itching  She has history of hypertension, diabetes, hyperlipidemia, morbid obesity and comes in because of vaginal itching and discharge.  Symptoms started about 2 days after completing a course of clindamycin for a dental infection.  She has had problems with vaginal yeast infections in the past.  There is no pelvic or abdominal pain.  She denies fever or chills.  She denies dysuria.  Past Medical History:  Diagnosis Date   Diabetes ([place])    Hyperlipidemia    Hypertension    Morbid obesity ([place])    Sleep apnea     Patient Active Problem List   Diagnosis Date Noted   Vaginal itching 08/01/2019   Vaginal burning 08/01/2019   Yeast infection 06/07/2019   Hemoglobin A1C greater than 9%, indicating poor diabetic control 05/14/2019   Type 2 diabetes mellitus without complication, without long-term current use of insulin ([place]) 03/08/2019   Elevated glucose 03/08/2019   Recent weight loss 03/08/2019   Obstructive sleep apnea 08/02/2017   Morbid obesity due to excess calories ([place]) 08/02/2017   HTN (hypertension) 04/26/2017   Abnormal uterine bleeding (AUB) 03/30/2016    History reviewed. No pertinent surgical history.   OB History    Gravida  0   Para      Term      Preterm      AB      Living  0     SAB      TAB      Ectopic      Multiple      Live Births              No family history on file.  Social History   Tobacco Use   Smoking status: Never Smoker   Smokeless tobacco: Never Used  Substance Use Topics   Alcohol use: No   Drug use: No    Home Medications Prior to Admission medications   Medication Sig Start Date End Date Taking? Authorizing  Provider  atorvastatin (LIPITOR) 10 MG tablet Take 1 tablet (10 mg total) by mouth daily. 09/06/19   Azzie Glatter, FNP  benzonatate (TESSALON) 100 MG capsule Take 1 capsule (100 mg total) by mouth every 8 (eight) hours. 02/19/20   [place], Myrene Galas, MD  blood glucose meter kit and supplies KIT Dispense based on patient and insurance preference. Use up to four times daily as directed. (FOR ICD-9 250.00, 250.01). 01/27/19   Lanae Boast, FNP  Blood Glucose Monitoring Suppl [facility] BLOOD GLUCOSE METER) w/Device KIT 1 applicator by Does not apply route 4 (four) times daily -  before meals and at bedtime. 05/16/18   Azzie Glatter, FNP  cetirizine (ZYRTEC ALLERGY) 10 MG tablet Take 1 tablet (10 mg total) by mouth daily. 02/19/20   Chase Picket, MD  FARXIGA 10 MG TABS tablet TAKE 1 TABLET BY MOUTH EVERY DAY 01/08/20   Azzie Glatter, FNP  fluconazole (DIFLUCAN) 150 MG tablet Take 1 tablet (150 mg total) by mouth daily. 05/21/21   Delora Fuel, MD  glipiZIDE (GLUCOTROL) 10 MG tablet TAKE 1 TABLET BY MOUTH TWICE A DAY 02/08/20   Azzie Glatter, FNP  glucose blood test strip Use  as instructed 05/16/18   Azzie Glatter, FNP  metFORMIN (GLUCOPHAGE) 1000 MG tablet TAKE 1 TABLET (1,000 MG TOTAL) BY MOUTH 2 (TWO) TIMES DAILY WITH A MEAL. 02/08/20 02/07/21  Azzie Glatter, FNP  triamterene-hydrochlorothiazide (DYAZIDE) 37.5-25 MG capsule TAKE 1 EACH (1 CAPSULE TOTAL) BY MOUTH DAILY. 12/05/19   Azzie Glatter, FNP    Allergies    Patient has no known allergies.  Review of Systems   Review of Systems  All other systems reviewed and are negative.   Physical Exam Updated Vital Signs BP (!) 144/100 (BP Location: Right Arm)    Pulse (!) 101    Temp 97.6 F (36.4 C) (Oral)    Resp 17    Ht '5\' 10"'$  (1.778 m)    Wt (!) 167.4 kg    SpO2 93%    BMI 52.95 kg/m   Physical Exam Vitals and nursing note reviewed.   Morbidly obese 29 year old female, resting comfortably and in no acute distress. Vital  signs are significant for elevated blood pressure. Oxygen saturation is 93%, which is normal. Head is normocephalic and atraumatic. PERRLA, EOMI. Oropharynx is clear. Neck is nontender and supple without adenopathy or JVD. Back is nontender and there is no CVA tenderness. Lungs are clear without rales, wheezes, or rhonchi. Chest is nontender. Heart has regular rate and rhythm without murmur. Abdomen is soft, flat, nontender without masses or hepatosplenomegaly and peristalsis is normoactive. Extremities have no cyanosis or edema, full range of motion is present. Skin is warm and dry without rash. Neurologic: Mental status is normal, cranial nerves are intact, there are no motor or sensory deficits.  ED Results / Procedures / Treatments   Labs (all labs ordered are listed, but only abnormal results are displayed) Labs Reviewed - No data to display  EKG None  Radiology No results found.  Procedures Procedures   Medications Ordered in ED Medications - No data to display  ED Course  I have reviewed the triage vital signs and the nursing notes.  MDM Rules/Calculators/A&P Vaginal itching consistent with monilial vaginitis following course of antibiotics.  I have discussed with patient the options of empiric treatment versus getting a vaginal swab to confirm presence of yeast.  Decision was made to proceed with empiric treatment and she is sent home with prescription for fluconazole.  Advised to return if symptoms do not improve.  I have advised her to get a prescription for fluconazole anytime she gets antibiotic prescriptions in the future.  Final Clinical Impression(s) / ED Diagnoses Final diagnoses:  Yeast vaginitis  Elevated blood pressure reading with diagnosis of hypertension  Morbid obesity ([place])    Rx / DC Orders ED Discharge Orders         Ordered    fluconazole (DIFLUCAN) 150 MG tablet  Daily     04/24/20 9784           Delora Fuel, MD 78/41/28 [phone]

## 2020-04-24 NOTE — Discharge Instructions (Signed)
Whenever you get a prescription for an antibiotic, also get a prescription for Diflucan to take after completing the course of antibiotics.

## 2020-04-24 NOTE — ED Triage Notes (Signed)
Pt sts vaginal itching after taking clindamycin for dental infection.

## 2020-04-25 NOTE — Telephone Encounter (Signed)
Attempted to contact patient, voicemail full unable to leave messages

## 2020-04-25 NOTE — Telephone Encounter (Signed)
Patient called back giving me [employer] number. Spoke with representative from [facility] they stated the [name] was not covered with her plan. You can appeal by contacting the appeal department at [phone]. Thank you.Marland Kitchen

## 2020-04-26 ENCOUNTER — Other Ambulatory Visit: Payer: Self-pay | Admitting: Family Medicine

## 2020-04-26 DIAGNOSIS — B379 Candidiasis, unspecified: Secondary | ICD-10-CM

## 2020-04-26 MED ORDER — FLUCONAZOLE 150 MG PO TABS: 150.0000 mg | ORAL_TABLET | Freq: Every day | ORAL | 0 refills | Status: DC

## 2020-04-27 ENCOUNTER — Other Ambulatory Visit: Payer: Self-pay

## 2020-04-27 ENCOUNTER — Emergency Department (HOSPITAL_COMMUNITY): Payer: 59

## 2020-04-27 ENCOUNTER — Emergency Department (HOSPITAL_COMMUNITY)
Admission: EM | Admit: 2020-04-27 | Discharge: 2020-04-28 | Disposition: A | Discharge status: Home / Self Care | Payer: 59 | Attending: Emergency Medicine | Admitting: Emergency Medicine

## 2020-04-27 ENCOUNTER — Encounter (HOSPITAL_COMMUNITY): Payer: Self-pay | Admitting: *Deleted

## 2020-04-27 DIAGNOSIS — I10 Essential (primary) hypertension: Secondary | ICD-10-CM | POA: Diagnosis not present

## 2020-04-27 DIAGNOSIS — E1165 Type 2 diabetes mellitus with hyperglycemia: Secondary | ICD-10-CM | POA: Diagnosis not present

## 2020-04-27 DIAGNOSIS — R739 Hyperglycemia, unspecified: Secondary | ICD-10-CM | POA: Diagnosis present

## 2020-04-27 DIAGNOSIS — R0789 Other chest pain: Secondary | ICD-10-CM | POA: Diagnosis not present

## 2020-04-27 DIAGNOSIS — R0602 Shortness of breath: Secondary | ICD-10-CM | POA: Insufficient documentation

## 2020-04-27 DIAGNOSIS — Z7984 Long term (current) use of oral hypoglycemic drugs: Secondary | ICD-10-CM | POA: Diagnosis not present

## 2020-04-27 DIAGNOSIS — Z79899 Other long term (current) drug therapy: Secondary | ICD-10-CM | POA: Diagnosis not present

## 2020-04-27 DIAGNOSIS — R350 Frequency of micturition: Secondary | ICD-10-CM | POA: Insufficient documentation

## 2020-04-27 LAB — TROPONIN I (HIGH SENSITIVITY): Troponin I (High Sensitivity): <2 ng/L (ref <18)

## 2020-04-27 LAB — CBC
HCT: 35.9 % — ABNORMAL LOW (ref 36.0–46.0)
Hemoglobin: 11.3 g/dL — ABNORMAL LOW (ref 12.0–15.0)
MCH: 20.2 pg — ABNORMAL LOW (ref 26.0–34.0)
MCHC: 31.5 g/dL (ref 30.0–36.0)
MCV: 64.2 fL — ABNORMAL LOW (ref 80.0–100.0)
Platelets: 425 10*3/uL — ABNORMAL HIGH (ref 150–400)
RBC: 5.59 MIL/uL — ABNORMAL HIGH (ref 3.87–5.11)
RDW: 18.3 % — ABNORMAL HIGH (ref 11.5–15.5)
WBC: 9.7 10*3/uL (ref 4.0–10.5)
nRBC: 0 % (ref 0.0–0.2)

## 2020-04-27 LAB — BASIC METABOLIC PANEL
Anion gap: 11 (ref 5–15)
BUN: 14 mg/dL (ref 6–20)
CO2: 23 mmol/L (ref 22–32)
Calcium: 9.2 mg/dL (ref 8.9–10.3)
Chloride: 103 mmol/L (ref 98–111)
Creatinine, Ser: 0.81 mg/dL (ref 0.44–1.00)
GFR calc Af Amer: >60 mL/min (ref >60)
GFR calc non Af Amer: >60 mL/min (ref >60)
Glucose, Bld: 365 mg/dL — ABNORMAL HIGH (ref 70–99)
Potassium: 4.1 mmol/L (ref 3.5–5.1)
Sodium: 137 mmol/L (ref 135–145)

## 2020-04-27 LAB — I-STAT BETA HCG BLOOD, ED (NOT ORDERABLE): I-stat hCG, quantitative: <5 m[IU]/mL (ref <5)

## 2020-04-27 MED ORDER — SODIUM CHLORIDE 0.9 % IV BOLUS
1000.0000 mL | Freq: Once | INTRAVENOUS | Status: AC
  Administered 2020-04-27: 1000 mL via INTRAVENOUS

## 2020-04-27 MED ORDER — SODIUM CHLORIDE 0.9% FLUSH
3.0000 mL | Freq: Once | INTRAVENOUS | Status: AC
  Administered 2020-04-27: 3 mL via INTRAVENOUS

## 2020-04-27 MED ORDER — INSULIN ASPART 100 UNIT/ML ~~LOC~~ SOLN
10.0000 [IU] | Freq: Once | SUBCUTANEOUS | Status: AC
  Administered 2020-04-28: 10 [IU] via INTRAVENOUS
  Filled 2020-04-27: qty 0.1

## 2020-04-27 NOTE — ED Triage Notes (Signed)
Pt reporting 1 hour ago she took her sugar was 398, and "it has never been that high". Also c/o chest tightness, shortness of breath and lightheadedness that started about 2 hours ago. Requesting inhaler in triage, saturations are 100%, lung sounds clear, no acute distress.

## 2020-04-27 NOTE — ED Provider Notes (Signed)
[facility] DEPT Provider Note   CSN: [id number] Arrival date & time: 04/27/20  2007     History Chief Complaint  Patient presents with  . Hyperglycemia    Grace Daniels is a 29 y.o. female.  Patient with history of T2DM, HTN, HLD, morbid obesity, sleep apnea to ED for evaluation and treatment of high blood sugar at home found to be 398. She admits that she does not check her sugar levels frequently. She had symptoms of urinary frequency, chest tightness and mild SOB this afternoon and are the symptoms she states she has when she is hyperglycemic, prompting her to measure a CBG today. She states this is the highest her blood sugar has ever been. She states she is compliant with all medications. No recent changes in dosing or medications.  The history is provided by the patient. No language interpreter was used.  Hyperglycemia Associated symptoms: shortness of breath   Associated symptoms: no dysuria, no fever, no nausea and no vomiting        Past Medical History:  Diagnosis Date  . Diabetes ([place])   . Hyperlipidemia   . Hypertension   . Morbid obesity ([place])   . Sleep apnea     Patient Active Problem List   Diagnosis Date Noted  . Vaginal itching 08/01/2019  . Vaginal burning 08/01/2019  . Yeast infection 06/07/2019  . Hemoglobin A1C greater than 9%, indicating poor diabetic control 05/14/2019  . Type 2 diabetes mellitus without complication, without long-term current use of insulin ([place]) 03/08/2019  . Elevated glucose 03/08/2019  . Recent weight loss 03/08/2019  . Obstructive sleep apnea 08/02/2017  . Morbid obesity due to excess calories ([place]) 08/02/2017  . HTN (hypertension) 04/26/2017  . Abnormal uterine bleeding (AUB) 03/30/2016    History reviewed. No pertinent surgical history.   OB History    Gravida  0   Para      Term      Preterm      AB      Living  0     SAB      TAB      Ectopic      Multiple       Live Births              No family history on file.  Social History   Tobacco Use  . Smoking status: Never Smoker  . Smokeless tobacco: Never Used  Vaping Use  . Vaping Use: Never used  Substance Use Topics  . Alcohol use: No  . Drug use: No    Home Medications Prior to Admission medications   Medication Sig Start Date End Date Taking? Authorizing Provider  atorvastatin (LIPITOR) 10 MG tablet Take 1 tablet (10 mg total) by mouth daily. 09/06/19   Azzie Glatter, FNP  benzonatate (TESSALON) 100 MG capsule Take 1 capsule (100 mg total) by mouth every 8 (eight) hours. 02/19/20   Lamptey, Myrene Galas, MD  blood glucose meter kit and supplies KIT Dispense based on patient and insurance preference. Use up to four times daily as directed. (FOR ICD-9 250.00, 250.01). 01/27/19   Lanae Boast, FNP  Blood Glucose Monitoring Suppl [facility] BLOOD GLUCOSE METER) w/Device KIT 1 applicator by Does not apply route 4 (four) times daily -  before meals and at bedtime. 05/16/18   Azzie Glatter, FNP  cetirizine (ZYRTEC ALLERGY) 10 MG tablet Take 1 tablet (10 mg total) by mouth daily. 02/19/20   Lamptey,  Myrene Galas, MD  FARXIGA 10 MG TABS tablet TAKE 1 TABLET BY MOUTH EVERY DAY 01/08/20   Azzie Glatter, FNP  fluconazole (DIFLUCAN) 150 MG tablet Take 1 tablet (150 mg total) by mouth daily. 04/26/20   Azzie Glatter, FNP  glipiZIDE (GLUCOTROL) 10 MG tablet TAKE 1 TABLET BY MOUTH TWICE A DAY 02/08/20   Azzie Glatter, FNP  glucose blood test strip Use as instructed 05/16/18   Azzie Glatter, FNP  metFORMIN (GLUCOPHAGE) 1000 MG tablet TAKE 1 TABLET (1,000 MG TOTAL) BY MOUTH 2 (TWO) TIMES DAILY WITH A MEAL. 02/08/20 02/07/21  Azzie Glatter, FNP  triamterene-hydrochlorothiazide (DYAZIDE) 37.5-25 MG capsule TAKE 1 EACH (1 CAPSULE TOTAL) BY MOUTH DAILY. 12/05/19   Azzie Glatter, FNP    Allergies    Patient has no known allergies.  Review of Systems   Review of Systems  Constitutional: Negative  for chills and fever.  HENT: Negative.   Respiratory: Positive for chest tightness and shortness of breath. Negative for cough.   Cardiovascular: Negative.   Gastrointestinal: Negative.  Negative for nausea and vomiting.  Genitourinary: Positive for frequency. Negative for dysuria.  Musculoskeletal: Negative.   Skin: Negative.   Neurological: Negative.  Negative for light-headedness.    Physical Exam Updated Vital Signs BP (!) 148/95 (BP Location: Left Arm)   Pulse (!) 121   Temp 98.1 F (36.7 C)   Resp 20   SpO2 100%   Physical Exam Vitals and nursing note reviewed.  Constitutional:      General: She is not in acute distress.    Appearance: She is well-developed. She is obese.  HENT:     Head: Normocephalic.     Mouth/Throat:     Mouth: Mucous membranes are moist.  Cardiovascular:     Rate and Rhythm: Regular rhythm. Tachycardia present.     Heart sounds: No murmur heard.   Pulmonary:     Effort: Pulmonary effort is normal.     Breath sounds: Normal breath sounds. No wheezing, rhonchi or rales.  Abdominal:     General: Bowel sounds are normal.     Palpations: Abdomen is soft.     Tenderness: There is no abdominal tenderness. There is no guarding or rebound.  Musculoskeletal:        General: Normal range of motion.     Cervical back: Normal range of motion and neck supple.  Skin:    General: Skin is warm and dry.     Findings: No rash.  Neurological:     Mental Status: She is alert and oriented to person, place, and time.     ED Results / Procedures / Treatments   Labs (all labs ordered are listed, but only abnormal results are displayed) Labs Reviewed  BASIC METABOLIC PANEL - Abnormal; Notable for the following components:      Result Value   Glucose, Bld 365 (*)    All other components within normal limits  CBC - Abnormal; Notable for the following components:   RBC 5.59 (*)    Hemoglobin 11.3 (*)    HCT 35.9 (*)    MCV 64.2 (*)    MCH 20.2 (*)     RDW 18.3 (*)    Platelets 425 (*)    All other components within normal limits  I-STAT BETA HCG BLOOD, ED ([facility], [facility], AP ONLY)  I-STAT BETA HCG BLOOD, ED (NOT ORDERABLE)  CBG MONITORING, ED  TROPONIN I (HIGH SENSITIVITY)  TROPONIN I (HIGH  SENSITIVITY)   Results for orders placed or performed during the hospital encounter of 04/27/20  Basic metabolic panel  Result Value Ref Range   Sodium 137 135 - 145 mmol/L   Potassium 4.1 3.5 - 5.1 mmol/L   Chloride 103 98 - 111 mmol/L   CO2 23 22 - 32 mmol/L   Glucose, Bld 365 (H) 70 - 99 mg/dL   BUN 14 6 - 20 mg/dL   Creatinine, Ser 0.15 0.44 - 1.00 mg/dL   Calcium 9.2 8.9 - 26.6 mg/dL   GFR calc non Af Amer >60 >60 mL/min   GFR calc Af Amer >60 >60 mL/min   Anion gap 11 5 - 15  CBC  Result Value Ref Range   WBC 9.7 4.0 - 10.5 K/uL   RBC 5.59 (H) 3.87 - 5.11 MIL/uL   Hemoglobin 11.3 (L) 12.0 - 15.0 g/dL   HCT 73.3 (L) 36 - 46 %   MCV 64.2 (L) 80.0 - 100.0 fL   MCH 20.2 (L) 26.0 - 34.0 pg   MCHC 31.5 30.0 - 36.0 g/dL   RDW 88.3 (H) 26.2 - 35.6 %   Platelets 425 (H) 150 - 400 K/uL   nRBC 0.0 0.0 - 0.2 %  I-Stat beta hCG blood, ED  Result Value Ref Range   I-stat hCG, quantitative <5.0 <5 mIU/mL   Comment 3          CBG monitoring, ED  Result Value Ref Range   Glucose-Capillary 193 (H) 70 - 99 mg/dL  Troponin I (High Sensitivity)  Result Value Ref Range   Troponin I (High Sensitivity) <2 <18 ng/L  Troponin I (High Sensitivity)  Result Value Ref Range   Troponin I (High Sensitivity) <2 <18 ng/L    EKG EKG Interpretation  Date/Time:  Saturday April 27 2020 20:24:29 EDT Ventricular Rate:  112 PR Interval:    QRS Duration: 93 QT Interval:  331 QTC Calculation: 452 R Axis:   104 Text Interpretation: Sinus tachycardia Borderline right axis deviation No significant change was found Confirmed by Paula Libra ([id number]) on 04/27/2020 11:15:08 PM   Radiology DG Chest 2 View  Result Date: 04/27/2020 CLINICAL DATA:  Chest pain shortness  of breath EXAM: CHEST - 2 VIEW COMPARISON:  11/25/2018 FINDINGS: The heart size and mediastinal contours are within normal limits. Both lungs are clear. The visualized skeletal structures are unremarkable. IMPRESSION: No active cardiopulmonary disease. Electronically Signed   By: Alcide Clever M.D.   On: 04/27/2020 20:50    Procedures Procedures (including critical care time)  Medications Ordered in ED Medications  sodium chloride flush (NS) 0.9 % injection 3 mL (3 mLs Intravenous Given 04/27/20 2335)  sodium chloride 0.9 % bolus 1,000 mL (1,000 mLs Intravenous New Bag/Given 04/27/20 2337)    ED Course  I have reviewed the triage vital signs and the nursing notes.  Pertinent labs & imaging results that were available during my care of the patient were reviewed by me and considered in my medical decision making (see chart for details).    MDM Rules/Calculators/A&P                          Patient to ED with ss/sxs as per HPI.  She is well appearing. Hyperglycemic to 365 on arrival. No evidence DKA. EKG without ischemic change, normal troponin, CXR clear.   IVF's ordered, IV insulin. Anticipate discharge home after better glycemic control.   Recheck after fluids and insulin  finds CBG to have been reduced to 193. She is feeling improved. VS improved with resolved tachycardia.   She can be discharged home. Will encourage more frequent checks of her blood sugar at home, better dietary choices, PCP follow up in one week to determine if medication adjustments are indicated.   Final Clinical Impression(s) / ED Diagnoses Final diagnoses:  None   1. Hyperglycemia  Rx / DC Orders ED Discharge Orders    None       Charlann Lange, Hershal Coria 04/28/20 0218    Molpus, Jenny Reichmann, MD 04/28/20 [phone]

## 2020-04-28 LAB — TROPONIN I (HIGH SENSITIVITY): Troponin I (High Sensitivity): <2 ng/L (ref <18)

## 2020-04-28 LAB — CBG MONITORING, ED: Glucose-Capillary: 193 mg/dL — ABNORMAL HIGH (ref 70–99)

## 2020-04-28 NOTE — Discharge Instructions (Addendum)
Continue to check your blood sugar regularly, 2-3 times daily. Keep a log of your measurements and follow up with your doctor in one week for recheck and consideration of any medication changes.   Return to the emergency department with any new or worsening symptoms.

## 2020-04-29 ENCOUNTER — Ambulatory Visit: Payer: 59 | Admitting: Nurse Practitioner

## 2020-05-01 ENCOUNTER — Other Ambulatory Visit: Payer: Self-pay | Admitting: Family Medicine

## 2020-05-01 NOTE — Telephone Encounter (Signed)
Patient has been notified

## 2020-05-16 DIAGNOSIS — M25561 Pain in right knee: Secondary | ICD-10-CM

## 2020-05-16 DIAGNOSIS — M79651 Pain in right thigh: Secondary | ICD-10-CM

## 2020-05-16 HISTORY — DX: Pain in right thigh: M79.651

## 2020-05-16 HISTORY — DX: Pain in right knee: M25.561

## 2020-05-20 ENCOUNTER — Other Ambulatory Visit: Payer: Self-pay | Admitting: Family Medicine

## 2020-05-20 DIAGNOSIS — E119 Type 2 diabetes mellitus without complications: Secondary | ICD-10-CM

## 2020-06-08 ENCOUNTER — Emergency Department (HOSPITAL_BASED_OUTPATIENT_CLINIC_OR_DEPARTMENT_OTHER)
Admission: EM | Admit: 2020-06-08 | Discharge: 2020-06-09 | Disposition: A | Discharge status: Home / Self Care | Payer: 59 | Attending: Emergency Medicine | Admitting: Emergency Medicine

## 2020-06-08 ENCOUNTER — Emergency Department (HOSPITAL_BASED_OUTPATIENT_CLINIC_OR_DEPARTMENT_OTHER): Payer: 59

## 2020-06-08 ENCOUNTER — Other Ambulatory Visit: Payer: Self-pay

## 2020-06-08 ENCOUNTER — Encounter (HOSPITAL_BASED_OUTPATIENT_CLINIC_OR_DEPARTMENT_OTHER): Payer: Self-pay | Admitting: Emergency Medicine

## 2020-06-08 DIAGNOSIS — Z6841 Body Mass Index (BMI) 40.0 and over, adult: Secondary | ICD-10-CM | POA: Insufficient documentation

## 2020-06-08 DIAGNOSIS — G8929 Other chronic pain: Secondary | ICD-10-CM

## 2020-06-08 DIAGNOSIS — E669 Obesity, unspecified: Secondary | ICD-10-CM | POA: Diagnosis not present

## 2020-06-08 DIAGNOSIS — M25561 Pain in right knee: Secondary | ICD-10-CM | POA: Insufficient documentation

## 2020-06-08 DIAGNOSIS — E785 Hyperlipidemia, unspecified: Secondary | ICD-10-CM | POA: Insufficient documentation

## 2020-06-08 DIAGNOSIS — I10 Essential (primary) hypertension: Secondary | ICD-10-CM | POA: Insufficient documentation

## 2020-06-08 DIAGNOSIS — Z7984 Long term (current) use of oral hypoglycemic drugs: Secondary | ICD-10-CM | POA: Diagnosis not present

## 2020-06-08 DIAGNOSIS — E119 Type 2 diabetes mellitus without complications: Secondary | ICD-10-CM | POA: Diagnosis not present

## 2020-06-08 LAB — PREGNANCY, URINE: Preg Test, Ur: NEGATIVE

## 2020-06-08 NOTE — ED Notes (Signed)
Imaging tbd after u-preg results

## 2020-06-08 NOTE — ED Provider Notes (Signed)
[facility] EMERGENCY DEPARTMENT Provider Note   CSN: [id number] Arrival date & time: 06/08/20  2020     History Chief Complaint  Patient presents with  . Knee Pain    Grace Daniels is a 29 y.o. female.  HPI   Patient presented to ED for evaluation of right knee pain.  Patient states she has been having thigh pain for the last couple months as well.  She is also noticed pain in her right knee when she is walking.  She feels a grinding sensation.  Over the last couple of days pain has become more intense.  Pain seems to be more on the right side  Past Medical History:  Diagnosis Date  . Diabetes ([place])   . Hyperlipidemia   . Hypertension   . Morbid obesity ([place])   . Sleep apnea     Patient Active Problem List   Diagnosis Date Noted  . Vaginal itching 08/01/2019  . Vaginal burning 08/01/2019  . Yeast infection 06/07/2019  . Hemoglobin A1C greater than 9%, indicating poor diabetic control 05/14/2019  . Type 2 diabetes mellitus without complication, without long-term current use of insulin ([place]) 03/08/2019  . Elevated glucose 03/08/2019  . Recent weight loss 03/08/2019  . Obstructive sleep apnea 08/02/2017  . Morbid obesity due to excess calories ([place]) 08/02/2017  . HTN (hypertension) 04/26/2017  . Abnormal uterine bleeding (AUB) 03/30/2016    History reviewed. No pertinent surgical history.   OB History    Gravida  0   Para      Term      Preterm      AB      Living  0     SAB      TAB      Ectopic      Multiple      Live Births              History reviewed. No pertinent family history.  Social History   Tobacco Use  . Smoking status: Never Smoker  . Smokeless tobacco: Never Used  Vaping Use  . Vaping Use: Never used  Substance Use Topics  . Alcohol use: No  . Drug use: No    Home Medications Prior to Admission medications   Medication Sig Start Date End Date Taking? Authorizing Provider  atorvastatin (LIPITOR) 10 MG  tablet TAKE 1 TABLET BY MOUTH EVERY DAY 05/21/20   Azzie Glatter, FNP  benzonatate (TESSALON) 100 MG capsule Take 1 capsule (100 mg total) by mouth every 8 (eight) hours. Patient not taking: Reported on 04/28/2020 02/19/20   Chase Picket, MD  blood glucose meter kit and supplies KIT Dispense based on patient and insurance preference. Use up to four times daily as directed. (FOR ICD-9 250.00, 250.01). 01/27/19   Lanae Boast, FNP  Blood Glucose Monitoring Suppl [facility] BLOOD GLUCOSE METER) w/Device KIT 1 applicator by Does not apply route 4 (four) times daily -  before meals and at bedtime. 05/16/18   Azzie Glatter, FNP  cetirizine (ZYRTEC ALLERGY) 10 MG tablet Take 1 tablet (10 mg total) by mouth daily. Patient not taking: Reported on 04/28/2020 02/19/20   Chase Picket, MD  FARXIGA 10 MG TABS tablet TAKE 1 TABLET BY MOUTH EVERY DAY Patient not taking: Reported on 04/28/2020 01/08/20   Azzie Glatter, FNP  fluconazole (DIFLUCAN) 150 MG tablet Take 1 tablet (150 mg total) by mouth daily. Patient not taking: Reported on 04/28/2020 04/26/20  Azzie Glatter, FNP  fluconazole (DIFLUCAN) 150 MG tablet Take 150 mg by mouth See admin instructions. Take 1 tablet on the 1st week and then take a 2nd dose on the 2nd week 04/20/20   [provider]  glipiZIDE (GLUCOTROL) 10 MG tablet TAKE 1 TABLET BY MOUTH TWICE A DAY Patient taking differently: Take 10 mg by mouth 2 (two) times daily before a meal.  02/08/20   Azzie Glatter, FNP  glucose blood test strip Use as instructed 05/16/18   Azzie Glatter, FNP  metFORMIN (GLUCOPHAGE) 1000 MG tablet TAKE 1 TABLET (1,000 MG TOTAL) BY MOUTH 2 (TWO) TIMES DAILY WITH A MEAL. 02/08/20 02/07/21  Azzie Glatter, FNP  triamterene-hydrochlorothiazide (DYAZIDE) 37.5-25 MG capsule TAKE 1 EACH (1 CAPSULE TOTAL) BY MOUTH DAILY. Patient taking differently: Take 1 capsule by mouth daily.  12/05/19   Azzie Glatter, FNP    Allergies    Patient has no known  allergies.  Review of Systems   Review of Systems  All other systems reviewed and are negative.   Physical Exam Updated Vital Signs BP (!) 151/97 (BP Location: Left Arm)   Pulse (!) 110   Temp 98.7 F (37.1 C) (Oral)   Resp 18   Wt (!) 169.9 kg   SpO2 100%   BMI 53.75 kg/m   Physical Exam Vitals and nursing note reviewed.  Constitutional:      General: She is not in acute distress.    Appearance: She is well-developed. She is obese.  HENT:     Head: Normocephalic and atraumatic.     Right Ear: External ear normal.     Left Ear: External ear normal.  Eyes:     General: No scleral icterus.       Right eye: No discharge.        Left eye: No discharge.     Conjunctiva/sclera: Conjunctivae normal.  Neck:     Trachea: No tracheal deviation.  Cardiovascular:     Rate and Rhythm: Normal rate.  Pulmonary:     Effort: Pulmonary effort is normal. No respiratory distress.     Breath sounds: No stridor.  Abdominal:     General: There is no distension.  Genitourinary:    Comments: Tenderness palpation right knee, pain with range of motion, no effusion, no erythema or edema, mild tenderness palpation right thigh, no erythema or swelling Musculoskeletal:        General: Tenderness present. No swelling or deformity.     Cervical back: Neck supple.  Skin:    General: Skin is warm and dry.     Findings: No rash.  Neurological:     Mental Status: She is alert.     Cranial Nerves: Cranial nerve deficit: no gross deficits.     ED Results / Procedures / Treatments   Labs (all labs ordered are listed, but only abnormal results are displayed) Labs Reviewed  PREGNANCY, URINE    EKG None  Radiology No results found.  Procedures Procedures (including critical care time)  Medications Ordered in ED Medications - No data to display  ED Course  I have reviewed the triage vital signs and the nursing notes.  Pertinent labs & imaging results that were available during my  care of the patient were reviewed by me and considered in my medical decision making (see chart for details).    MDM Rules/Calculators/A&P  Pt presents with knee and thigh pain.  No signs of infection.  No signs to suggest DVT.  Pt is morbidly obese, could be contributing to increased strain on joints.  Xrays pending.  Care turned over to Dr Leonette Monarch Final Clinical Impression(s) / ED Diagnoses pending   Dorie Rank, MD 06/08/20 2307

## 2020-06-08 NOTE — ED Notes (Signed)
Patient transported to X-ray and returned 

## 2020-06-08 NOTE — ED Notes (Signed)
Dr.Knapp at bedside  

## 2020-06-08 NOTE — ED Triage Notes (Signed)
Patient states that she has had pain to her right thigh and knee x 2 months. The patient states that it has worsened over the last 2 -3 weeks

## 2020-06-09 MED ORDER — KETOROLAC TROMETHAMINE 60 MG/2ML IM SOLN
30.0000 mg | Freq: Once | INTRAMUSCULAR | Status: AC
  Administered 2020-06-09: 30 mg via INTRAMUSCULAR
  Filled 2020-06-09: qty 2

## 2020-06-09 NOTE — ED Provider Notes (Signed)
I assumed care of this patient.  Please see previous provider note for further details of Hx, PE.  Briefly patient is a 29 y.o. female who presented right knee pain pending xrays.  Negative imaging for acute injuries.  Minimal medial and patellofemoral  compartment degenerative change noted.  Given toradol.  The patient appears reasonably screened and/or stabilized for discharge and I doubt any other medical condition or other [facility] requiring further screening, evaluation, or treatment in the ED at this time prior to discharge. Safe for discharge with strict return precautions.  Disposition: Discharge  Condition: Good  I have discussed the results, Dx and Tx plan with the patient/family who expressed understanding and agree(s) with the plan. Discharge instructions discussed at length. The patient/family was given strict return precautions who verbalized understanding of the instructions. No further questions at time of discharge.    ED Discharge Orders    None        Follow Up: Kallie Locks, FNP [id number] [place] [address] [phone]            Nira Conn, MD 06/09/20 [phone]

## 2020-06-24 ENCOUNTER — Ambulatory Visit (INDEPENDENT_AMBULATORY_CARE_PROVIDER_SITE_OTHER): Payer: 59 | Admitting: Family Medicine

## 2020-06-24 ENCOUNTER — Encounter: Payer: Self-pay | Admitting: Family Medicine

## 2020-06-24 ENCOUNTER — Other Ambulatory Visit: Payer: Self-pay

## 2020-06-24 VITALS — BP 142/91 | HR 91 | Temp 97.8°F | Resp 16 | Ht 70.0 in | Wt 372.0 lb

## 2020-06-24 DIAGNOSIS — E119 Type 2 diabetes mellitus without complications: Secondary | ICD-10-CM | POA: Diagnosis not present

## 2020-06-24 DIAGNOSIS — R7309 Other abnormal glucose: Secondary | ICD-10-CM | POA: Diagnosis not present

## 2020-06-24 DIAGNOSIS — M79651 Pain in right thigh: Secondary | ICD-10-CM | POA: Insufficient documentation

## 2020-06-24 DIAGNOSIS — M25561 Pain in right knee: Secondary | ICD-10-CM | POA: Diagnosis not present

## 2020-06-24 DIAGNOSIS — I10 Essential (primary) hypertension: Secondary | ICD-10-CM

## 2020-06-24 DIAGNOSIS — R739 Hyperglycemia, unspecified: Secondary | ICD-10-CM

## 2020-06-24 DIAGNOSIS — Z09 Encounter for follow-up examination after completed treatment for conditions other than malignant neoplasm: Secondary | ICD-10-CM

## 2020-06-24 DIAGNOSIS — Z0000 Encounter for general adult medical examination without abnormal findings: Secondary | ICD-10-CM

## 2020-06-24 LAB — POCT URINALYSIS DIPSTICK
Bilirubin, UA: NEGATIVE
Blood, UA: NEGATIVE
Glucose, UA: POSITIVE — AB
Ketones, UA: NEGATIVE
Leukocytes, UA: NEGATIVE
Nitrite, UA: NEGATIVE
Protein, UA: NEGATIVE
Spec Grav, UA: 1.025 (ref 1.010–1.025)
Urobilinogen, UA: 1 E.U./dL
pH, UA: 6.5 (ref 5.0–8.0)

## 2020-06-24 LAB — POCT GLYCOSYLATED HEMOGLOBIN (HGB A1C): Hemoglobin A1C: 9.5 % — AB (ref 4.0–5.6)

## 2020-06-24 MED ORDER — METFORMIN HCL 1000 MG PO TABS: 1000.0000 mg | ORAL_TABLET | Freq: Two times a day (BID) | ORAL | 3 refills | Status: DC

## 2020-06-24 MED ORDER — GLIPIZIDE 10 MG PO TABS: 10.0000 mg | ORAL_TABLET | Freq: Two times a day (BID) | ORAL | 3 refills | Status: DC

## 2020-06-24 MED ORDER — ATORVASTATIN CALCIUM 10 MG PO TABS: 10.0000 mg | ORAL_TABLET | Freq: Every day | ORAL | 3 refills | Status: DC

## 2020-06-24 MED ORDER — DAPAGLIFLOZIN PROPANEDIOL 10 MG PO TABS: 10.0000 mg | ORAL_TABLET | Freq: Every day | ORAL | 3 refills | Status: DC

## 2020-06-24 MED ORDER — TRIAMTERENE-HCTZ 37.5-25 MG PO CAPS: ORAL_CAPSULE | ORAL | 3 refills | Status: DC

## 2020-06-24 NOTE — Progress Notes (Signed)
Patient [facility]    Established Patient Office Visit  Subjective:  Patient ID: Grace Daniels, female    DOB: 09-16-1991  Age: 29 y.o. MRN: [id number]  CC:  Chief Complaint  Patient presents with  . Medication Refill    all meds except farxiga   . Follow-up  . Diabetes    HPI Grace Daniels is a 29 year old female who presents for Hospital Follow Up today.    Patient Active Problem List   Diagnosis Date Noted  . Vaginal itching 08/01/2019  . Vaginal burning 08/01/2019  . Yeast infection 06/07/2019  . Hemoglobin A1C greater than 9%, indicating poor diabetic control 05/14/2019  . Type 2 diabetes mellitus without complication, without long-term current use of insulin ([place]) 03/08/2019  . Elevated glucose 03/08/2019  . Recent weight loss 03/08/2019  . Obstructive sleep apnea 08/02/2017  . Morbid obesity due to excess calories ([place]) 08/02/2017  . HTN (hypertension) 04/26/2017  . Abnormal uterine bleeding (AUB) 03/30/2016    Past Medical History:  Diagnosis Date  . Diabetes ([place])   . Hyperlipidemia   . Hypertension   . Morbid obesity ([place])   . Sleep apnea    Current Status: Since her last office visit, she has had an ED visit for Chronic Knee Pain on 06/08/2020. She reports right knee pain X 3 months now. Today, her thigh pain is 10/10 today. She is scheduled with Orthopedic this week.  is doing well with no complaints. Her Hgb A1c is elevated today, r/t insurance denying continual coverage of [place]. Her most recent normal range of preprandial blood glucose levels have been between 180-280 . She has seen low range of 120 and high of 300 since his last office visit. She denies fatigue, frequent urination, blurred vision, excessive hunger, excessive thirst, weight gain, weight loss, and poor wound healing. She continues to check her feet regularly. She denies visual changes, chest pain, cough, shortness of breath, heart palpitations,  and falls. She has occasional headaches and dizziness with position changes. Denies severe headaches, confusion, seizures, double vision, and blurred vision, nausea and vomiting. She denies fevers, chills, recent infections, weight loss, and night sweats. Denies GI problems such as diarrhea, and constipation. She has no reports of blood in stools, dysuria and hematuria. No depression or anxiety reported today. She is taking all medications as prescribed.    History reviewed. No pertinent surgical history.  History reviewed. No pertinent family history.  Social History   Socioeconomic History  . Marital status: Single    Spouse name: Not on file  . Number of children: Not on file  . Years of education: Not on file  . Highest education level: Not on file  Occupational History  . Not on file  Tobacco Use  . Smoking status: Never Smoker  . Smokeless tobacco: Never Used  Vaping Use  . Vaping Use: Never used  Substance and Sexual Activity  . Alcohol use: No  . Drug use: No  . Sexual activity: Not Currently  Other Topics Concern  . Not on file  Social History Narrative  . Not on file   Social Determinants of Health   Financial Resource Strain:   . Difficulty of Paying Living Expenses:   Food Insecurity:   . Worried About Charity fundraiser in the Last Year:   . Arboriculturist in the Last Year:   Transportation Needs:   . Film/video editor (Medical):   Marland Kitchen  Lack of Transportation (Non-Medical):   Physical Activity:   . Days of Exercise per Week:   . Minutes of Exercise per Session:   Stress:   . Feeling of Stress :   Social Connections:   . Frequency of Communication with Friends and Family:   . Frequency of Social Gatherings with Friends and Family:   . Attends Religious Services:   . Active Member of Clubs or Organizations:   . Attends Archivist Meetings:   Marland Kitchen Marital Status:   Intimate Partner Violence:   . Fear of Current or Ex-Partner:   . Emotionally  Abused:   Marland Kitchen Physically Abused:   . Sexually Abused:     Outpatient Medications Prior to Visit  Medication Sig Dispense Refill  . blood glucose meter kit and supplies KIT Dispense based on patient and insurance preference. Use up to four times daily as directed. (FOR ICD-9 250.00, 250.01). 1 each 0  . Blood Glucose Monitoring Suppl (ACURA BLOOD GLUCOSE METER) w/Device KIT 1 applicator by Does not apply route 4 (four) times daily -  before meals and at bedtime. 1 kit 1  . glucose blood test strip Use as instructed 100 each 12  . atorvastatin (LIPITOR) 10 MG tablet TAKE 1 TABLET BY MOUTH EVERY DAY 90 tablet 1  . glipiZIDE (GLUCOTROL) 10 MG tablet TAKE 1 TABLET BY MOUTH TWICE A DAY (Patient taking differently: Take 10 mg by mouth 2 (two) times daily before a meal. ) 60 tablet 3  . metFORMIN (GLUCOPHAGE) 1000 MG tablet TAKE 1 TABLET (1,000 MG TOTAL) BY MOUTH 2 (TWO) TIMES DAILY WITH A MEAL. 60 tablet 3  . triamterene-hydrochlorothiazide (DYAZIDE) 37.5-25 MG capsule TAKE 1 EACH (1 CAPSULE TOTAL) BY MOUTH DAILY. (Patient taking differently: Take 1 capsule by mouth daily. ) 30 capsule 8  . benzonatate (TESSALON) 100 MG capsule Take 1 capsule (100 mg total) by mouth every 8 (eight) hours. (Patient not taking: Reported on 04/28/2020) 30 capsule 0  . cetirizine (ZYRTEC ALLERGY) 10 MG tablet Take 1 tablet (10 mg total) by mouth daily. (Patient not taking: Reported on 04/28/2020) 30 tablet 2  . FARXIGA 10 MG TABS tablet TAKE 1 TABLET BY MOUTH EVERY DAY (Patient not taking: Reported on 04/28/2020) 30 tablet 2  . fluconazole (DIFLUCAN) 150 MG tablet Take 1 tablet (150 mg total) by mouth daily. (Patient not taking: Reported on 04/28/2020) 1 tablet 0  . fluconazole (DIFLUCAN) 150 MG tablet Take 150 mg by mouth See admin instructions. Take 1 tablet on the 1st week and then take a 2nd dose on the 2nd week     No facility-administered medications prior to visit.    No Known Allergies  ROS Review of Systems    Constitutional: Negative.   HENT: Negative.   Eyes: Negative.   Respiratory: Negative.   Cardiovascular: Negative.   Gastrointestinal: Positive for abdominal distention.  Endocrine: Negative.   Genitourinary: Negative.   Musculoskeletal: Positive for arthralgias (generalized joint pain).       Right knee and thigh pain  Skin: Negative.   Allergic/Immunologic: Negative.   Neurological: Positive for dizziness (occasional) and headaches (occasional ).  Hematological: Negative.   Psychiatric/Behavioral: Negative.       Objective:    Physical Exam Vitals and nursing note reviewed.  Constitutional:      Appearance: Normal appearance. She is obese.  HENT:     Head: Normocephalic and atraumatic.     Nose: Nose normal.     Mouth/Throat:  Mouth: Mucous membranes are moist.     Pharynx: Oropharynx is clear.  Cardiovascular:     Rate and Rhythm: Normal rate and regular rhythm.     Pulses: Normal pulses.     Heart sounds: Normal heart sounds.  Pulmonary:     Effort: Pulmonary effort is normal.  Abdominal:     General: Bowel sounds are normal. There is distension (obese).     Palpations: Abdomen is soft.  Musculoskeletal:        General: Tenderness present.     Cervical back: Normal range of motion and neck supple.     Comments: Right knee/thigh pain and tenderness  Skin:    General: Skin is warm and dry.  Neurological:     General: No focal deficit present.     Mental Status: She is alert and oriented to person, place, and time.  Psychiatric:        Mood and Affect: Mood normal.        Behavior: Behavior normal.        Thought Content: Thought content normal.        Judgment: Judgment normal.     BP (!) 142/91 (BP Location: Right Arm, Patient Position: Sitting, Cuff Size: Large)   Pulse 91   Temp 97.8 F (36.6 C) (Oral)   Resp 16   Ht '5\' 10"'$  (1.778 m)   Wt (!) 372 lb (168.7 kg)   LMP  (LMP Unknown) Comment: neg u-preg today  SpO2 98%   BMI 53.38 kg/m  Wt  Readings from Last 3 Encounters:  06/24/20 (!) 372 lb (168.7 kg)  06/08/20 (!) 374 lb 9.6 oz (169.9 kg)  04/24/20 (!) 369 lb (167.4 kg)     Health Maintenance Due  Topic Date Due  . Hepatitis C Screening  Never done  . PNEUMOCOCCAL POLYSACCHARIDE VACCINE AGE 58-64 HIGH RISK  Never done  . COVID-19 Vaccine (1) Never done  . HIV Screening  Never done  . TETANUS/TDAP  Never done  . FOOT EXAM  11/01/2018  . URINE MICROALBUMIN  05/17/2019  . INFLUENZA VACCINE  06/16/2020    There are no preventive care reminders to display for this patient.  Lab Results  Component Value Date   TSH 2.050 05/02/2018   Lab Results  Component Value Date   WBC 9.7 04/27/2020   HGB 11.3 (L) 04/27/2020   HCT 35.9 (L) 04/27/2020   MCV 64.2 (L) 04/27/2020   PLT 425 (H) 04/27/2020   Lab Results  Component Value Date   NA 137 04/27/2020   K 4.1 04/27/2020   CO2 23 04/27/2020   GLUCOSE 365 (H) 04/27/2020   BUN 14 04/27/2020   CREATININE 0.81 04/27/2020   BILITOT <0.2 10/06/2019   ALKPHOS 75 10/06/2019   AST 13 10/06/2019   ALT 13 10/06/2019   PROT 8.5 10/06/2019   ALBUMIN 4.5 10/06/2019   CALCIUM 9.2 04/27/2020   ANIONGAP 11 04/27/2020   Lab Results  Component Value Date   CHOL 141 05/02/2018   Lab Results  Component Value Date   HDL 24 (L) 05/02/2018   Lab Results  Component Value Date   LDLCALC 55 05/02/2018   Lab Results  Component Value Date   TRIG 312 (H) 05/02/2018   Lab Results  Component Value Date   CHOLHDL 5.9 (H) 05/02/2018   Lab Results  Component Value Date   HGBA1C 9.5 (A) 06/24/2020      Assessment & Plan:   1. Hospital discharge follow-up  2. Acute pain of right thigh She will keep scheduled appointment with Orthopedics for further assessment.   3. Acute pain of right knee  4. Type 2 diabetes mellitus without complication, without long-term current use of insulin (HCC) Blood glucose levels have increased since denial of continual coverage of Faxiga  by insurance coverage. She will continue medication as prescribed, to decrease foods/beverages high in sugars and carbs and follow Heart Healthy or DASH diet. Increase physical activity to at least 30 minutes cardio exercise daily.  - HgB A1c - Urinalysis Dipstick - Microalbumin/Creatinine Ratio, Urine - atorvastatin (LIPITOR) 10 MG tablet; Take 1 tablet (10 mg total) by mouth daily.  Dispense: 90 tablet; Refill: 3 - glipiZIDE (GLUCOTROL) 10 MG tablet; Take 1 tablet (10 mg total) by mouth 2 (two) times daily.  Dispense: 180 tablet; Refill: 3 - metFORMIN (GLUCOPHAGE) 1000 MG tablet; Take 1 tablet (1,000 mg total) by mouth 2 (two) times daily with a meal.  Dispense: 180 tablet; Refill: 3 - dapagliflozin propanediol (FARXIGA) 10 MG TABS tablet; Take 1 tablet (10 mg total) by mouth daily.  Dispense: 90 tablet; Refill: 3  5. Hemoglobin A1c between 7.0% and 9.0% Increased. Hgb A1c increased at 9.5 today, from 7.6 on 12/25/2019. Monitor.   6. Essential (primary) hypertension The current medical regimen is effective; blood pressure is stable at 142/91 today; continue present plan and medications as prescribed. She will continue to take medications as prescribed, to decrease high sodium intake, excessive alcohol intake, increase potassium intake, smoking cessation, and increase physical activity of at least 30 minutes of cardio activity daily. She will continue to follow Heart Healthy or DASH diet. - triamterene-hydrochlorothiazide (DYAZIDE) 37.5-25 MG capsule; TAKE 1 EACH (1 CAPSULE TOTAL) BY MOUTH DAILY.  Dispense: 90 capsule; Refill: 3  7. Healthcare maintenance - HepB+HepC+HIV Panel  8. Follow up She will follow up in 6 months.   Meds ordered this encounter  Medications  . atorvastatin (LIPITOR) 10 MG tablet    Sig: Take 1 tablet (10 mg total) by mouth daily.    Dispense:  90 tablet    Refill:  3  . glipiZIDE (GLUCOTROL) 10 MG tablet    Sig: Take 1 tablet (10 mg total) by mouth 2 (two) times  daily.    Dispense:  180 tablet    Refill:  3  . metFORMIN (GLUCOPHAGE) 1000 MG tablet    Sig: Take 1 tablet (1,000 mg total) by mouth 2 (two) times daily with a meal.    Dispense:  180 tablet    Refill:  3  . triamterene-hydrochlorothiazide (DYAZIDE) 37.5-25 MG capsule    Sig: TAKE 1 EACH (1 CAPSULE TOTAL) BY MOUTH DAILY.    Dispense:  90 capsule    Refill:  3  . dapagliflozin propanediol (FARXIGA) 10 MG TABS tablet    Sig: Take 1 tablet (10 mg total) by mouth daily.    Dispense:  90 tablet    Refill:  3    Patient state this medication was denied by her insurance company. Please forward appeal information. Thank you.    Orders Placed This Encounter  Procedures  . Microalbumin/Creatinine Ratio, Urine  . HepB+HepC+HIV Panel  . HgB A1c  . Urinalysis Dipstick    Referral Orders  No referral(s) requested today    Raliegh Ip,  MSN, FNP-BC [facility] [address] [place], [place] [address] [phone] fax    Problem List Items Addressed  This Visit      Endocrine   Type 2 diabetes mellitus without complication, without long-term current use of insulin (HCC)   Relevant Medications   atorvastatin (LIPITOR) 10 MG tablet   glipiZIDE (GLUCOTROL) 10 MG tablet   metFORMIN (GLUCOPHAGE) 1000 MG tablet   dapagliflozin propanediol (FARXIGA) 10 MG TABS tablet   Other Relevant Orders   HgB A1c (Completed)   Urinalysis Dipstick (Completed)   Microalbumin/Creatinine Ratio, Urine    Other Visit Diagnoses    Hospital discharge follow-up    -  Primary   Acute pain of right thigh       Acute pain of right knee       Hemoglobin A1c between 7.0% and 9.0%       Essential (primary) hypertension       Relevant Medications   atorvastatin (LIPITOR) 10 MG tablet   triamterene-hydrochlorothiazide (DYAZIDE) 37.5-25 MG capsule   Healthcare maintenance       Relevant Orders    HepB+HepC+HIV Panel   Follow up          Meds ordered this encounter  Medications  . atorvastatin (LIPITOR) 10 MG tablet    Sig: Take 1 tablet (10 mg total) by mouth daily.    Dispense:  90 tablet    Refill:  3  . glipiZIDE (GLUCOTROL) 10 MG tablet    Sig: Take 1 tablet (10 mg total) by mouth 2 (two) times daily.    Dispense:  180 tablet    Refill:  3  . metFORMIN (GLUCOPHAGE) 1000 MG tablet    Sig: Take 1 tablet (1,000 mg total) by mouth 2 (two) times daily with a meal.    Dispense:  180 tablet    Refill:  3  . triamterene-hydrochlorothiazide (DYAZIDE) 37.5-25 MG capsule    Sig: TAKE 1 EACH (1 CAPSULE TOTAL) BY MOUTH DAILY.    Dispense:  90 capsule    Refill:  3  . dapagliflozin propanediol (FARXIGA) 10 MG TABS tablet    Sig: Take 1 tablet (10 mg total) by mouth daily.    Dispense:  90 tablet    Refill:  3    Patient state this medication was denied by her insurance company. Please forward appeal information. Thank you.    Follow-up: Return in about 6 months (around 12/25/2020).    Azzie Glatter, FNP

## 2020-06-25 ENCOUNTER — Encounter: Payer: Self-pay | Admitting: Family Medicine

## 2020-06-25 ENCOUNTER — Telehealth: Payer: Self-pay | Admitting: Family Medicine

## 2020-06-25 LAB — HEPB+HEPC+HIV PANEL
HIV Screen 4th Generation wRfx: NONREACTIVE
Hep B C IgM: NEGATIVE
Hep B Core Total Ab: NEGATIVE
Hep B E Ab: NEGATIVE
Hep B E Ag: NEGATIVE
Hep B Surface Ab, Qual: NONREACTIVE
Hep C Virus Ab: <0.1 s/co ratio (ref 0.0–0.9)
Hepatitis B Surface Ag: NEGATIVE

## 2020-06-25 NOTE — Telephone Encounter (Signed)
Done

## 2020-06-26 LAB — MICROALBUMIN / CREATININE URINE RATIO
Creatinine, Urine: 127.5 mg/dL
Microalb/Creat Ratio: 8 mg/g creat (ref 0–29)
Microalbumin, Urine: 10.2 ug/mL

## 2020-08-14 ENCOUNTER — Encounter: Payer: Self-pay | Admitting: Family Medicine

## 2020-08-19 ENCOUNTER — Other Ambulatory Visit: Payer: Self-pay | Admitting: Family Medicine

## 2020-08-19 DIAGNOSIS — B3749 Other urogenital candidiasis: Secondary | ICD-10-CM

## 2020-08-19 MED ORDER — FLUCONAZOLE 150 MG PO TABS: 150.0000 mg | ORAL_TABLET | Freq: Once | ORAL | 0 refills | Status: AC

## 2020-09-11 ENCOUNTER — Other Ambulatory Visit: Payer: Self-pay

## 2020-09-11 ENCOUNTER — Ambulatory Visit (INDEPENDENT_AMBULATORY_CARE_PROVIDER_SITE_OTHER): Payer: 59 | Admitting: Nurse Practitioner

## 2020-09-11 ENCOUNTER — Encounter: Payer: Self-pay | Admitting: Nurse Practitioner

## 2020-09-11 VITALS — BP 148/92 | HR 100 | Temp 97.9°F | Ht 70.0 in | Wt 361.2 lb

## 2020-09-11 DIAGNOSIS — N949 Unspecified condition associated with female genital organs and menstrual cycle: Secondary | ICD-10-CM

## 2020-09-11 DIAGNOSIS — B3749 Other urogenital candidiasis: Secondary | ICD-10-CM

## 2020-09-11 LAB — POCT URINALYSIS DIPSTICK
Bilirubin, UA: NEGATIVE
Glucose, UA: POSITIVE — AB
Leukocytes, UA: NEGATIVE
Nitrite, UA: NEGATIVE
Protein, UA: POSITIVE — AB
Spec Grav, UA: >=1.03 — AB (ref 1.010–1.025)
Urobilinogen, UA: 0.2 E.U./dL
pH, UA: 5 (ref 5.0–8.0)

## 2020-09-11 MED ORDER — FLUCONAZOLE 150 MG PO TABS
150.0000 mg | ORAL_TABLET | Freq: Once | ORAL | 0 refills | Status: AC
Start: 2020-09-11 — End: 2020-09-11

## 2020-09-11 MED ORDER — SAXAGLIPTIN HCL 5 MG PO TABS: 5.0000 mg | ORAL_TABLET | Freq: Every day | ORAL | 3 refills | Status: DC

## 2020-09-11 NOTE — Progress Notes (Signed)
[place], [place]  [address] Phone:  [phone]   Fax:  [phone]   Established Patient Office Visit  Subjective:  Patient ID: Grace Daniels, female    DOB: 24-Mar-1991  Age: 29 y.o. MRN: [id number]  CC:  Chief Complaint  Patient presents with   Urinary Tract Infection    Slight discharge, itching and burning during unrination, and slight odor x2wks.    HPI Grace Daniels presents for urinary. She  has a past medical history of Diabetes ([place]), Hyperlipidemia, Hypertension, Morbid obesity ([place]), Right knee pain (05/2020), Right thigh pain (05/2020), and Sleep apnea.   Urinary Tract Infection Patient complains of foul smelling urine. She has had symptoms for 2 weeks. Patient also complains of vaginal discharge. Patient denies back pain, cough, fever, headache, rhinitis, sorethroat and stomach ache. Patient does have a history of recurrent UTI. Patient does not have a history of pyelonephritis. Azo used last on yesterday; effective   DM CBG yesterday 340 mg/dl after a meal. She has not been able to take the Dapagliflozin because her insurance will not cover. She admits that she is trying to work on diet    Past Medical History:  Diagnosis Date   Diabetes ([place])    Hyperlipidemia    Hypertension    Morbid obesity ([place])    Right knee pain 05/2020   Right thigh pain 05/2020   Sleep apnea     No past surgical history on file.  No family history on file.  Social History   Socioeconomic History   Marital status: Single    Spouse name: Not on file   Number of children: Not on file   Years of education: Not on file   Highest education level: Not on file  Occupational History   Not on file  Tobacco Use   Smoking status: Never Smoker   Smokeless tobacco: Never Used  Vaping Use   Vaping Use: Never used  Substance and Sexual Activity   Alcohol use: No   Drug use: No   Sexual activity: Not Currently    Other Topics Concern   Not on file  Social History Narrative   Not on file   Social Determinants of Health   Financial Resource Strain:    Difficulty of Paying Living Expenses: Not on file  Food Insecurity:    Worried About [place] in the Last Year: Not on file   Ran Out of Food in the Last Year: Not on file  Transportation Needs:    Lack of Transportation (Medical): Not on file   Lack of Transportation (Non-Medical): Not on file  Physical Activity:    Days of Exercise per Week: Not on file   Minutes of Exercise per Session: Not on file  Stress:    Feeling of Stress : Not on file  Social Connections:    Frequency of Communication with Friends and Family: Not on file   Frequency of Social Gatherings with Friends and Family: Not on file   Attends Religious Services: Not on file   Active Member of Clubs or Organizations: Not on file   Attends Archivist Meetings: Not on file   Marital Status: Not on file  Intimate Partner Violence:    Fear of Current or Ex-Partner: Not on file   Emotionally Abused: Not on file   Physically Abused: Not on file   Sexually Abused: Not on file    Outpatient  Medications Prior to Visit  Medication Sig Dispense Refill   atorvastatin (LIPITOR) 10 MG tablet Take 1 tablet (10 mg total) by mouth daily. 90 tablet 3   blood glucose meter kit and supplies KIT Dispense based on patient and insurance preference. Use up to four times daily as directed. (FOR ICD-9 250.00, 250.01). 1 each 0   Blood Glucose Monitoring Suppl (ACURA BLOOD GLUCOSE METER) w/Device KIT 1 applicator by Does not apply route 4 (four) times daily -  before meals and at bedtime. 1 kit 1   glipiZIDE (GLUCOTROL) 10 MG tablet Take 1 tablet (10 mg total) by mouth 2 (two) times daily. 180 tablet 3   glucose blood test strip Use as instructed 100 each 12   metFORMIN (GLUCOPHAGE) 1000 MG tablet Take 1 tablet (1,000 mg total) by mouth 2 (two) times  daily with a meal. 180 tablet 3   triamterene-hydrochlorothiazide (DYAZIDE) 37.5-25 MG capsule TAKE 1 EACH (1 CAPSULE TOTAL) BY MOUTH DAILY. 90 capsule 3   dapagliflozin propanediol (FARXIGA) 10 MG TABS tablet Take 1 tablet (10 mg total) by mouth daily. (Patient not taking: Reported on 09/11/2020) 90 tablet 3   No facility-administered medications prior to visit.    No Known Allergies  ROS Review of Systems    Objective:    Physical Exam Constitutional:      General: She is not in acute distress.    Appearance: She is obese. She is not ill-appearing, toxic-appearing or diaphoretic.  Cardiovascular:     Rate and Rhythm: Normal rate.  Pulmonary:     Effort: Pulmonary effort is normal.  Genitourinary:    Comments: Deferred Skin:    General: Skin is warm and dry.     Capillary Refill: Capillary refill takes less than 2 seconds.  Neurological:     General: No focal deficit present.     Mental Status: She is alert and oriented to person, place, and time.  Psychiatric:        Mood and Affect: Mood normal.        Behavior: Behavior normal.        Thought Content: Thought content normal.        Judgment: Judgment normal.     BP (!) 148/92 (BP Location: Left Arm, Patient Position: Sitting, Cuff Size: Large)    Pulse 100    Temp 97.9 F (36.6 C)    Ht _0  (1.778 m)    Wt (!) 361 lb 3.2 oz (163.8 kg)    LMP 07/01/2020 (Approximate)    SpO2 98%    BMI 51.83 kg/m  Wt Readings from Last 3 Encounters:  09/11/20 (!) 361 lb 3.2 oz (163.8 kg)  06/24/20 (!) 372 lb (168.7 kg)  06/08/20 (!) 374 lb 9.6 oz (169.9 kg)     Health Maintenance Due  Topic Date Due   PNEUMOCOCCAL POLYSACCHARIDE VACCINE AGE 48-64 HIGH RISK  Never done   OPHTHALMOLOGY EXAM  Never done   COVID-19 Vaccine (1) Never done   TETANUS/TDAP  Never done   FOOT EXAM  11/01/2018   INFLUENZA VACCINE  Never done    There are no preventive care reminders to display for this patient.  Lab Results  Component  Value Date   TSH 2.050 05/02/2018   Lab Results  Component Value Date   WBC 9.7 04/27/2020   HGB 11.3 (L) 04/27/2020   HCT 35.9 (L) 04/27/2020   MCV 64.2 (L) 04/27/2020   PLT 425 (H) 04/27/2020   Lab Results  Component Value Date   NA 137 04/27/2020   K 4.1 04/27/2020   CO2 23 04/27/2020   GLUCOSE 365 (H) 04/27/2020   BUN 14 04/27/2020   CREATININE 0.81 04/27/2020   BILITOT <0.2 10/06/2019   ALKPHOS 75 10/06/2019   AST 13 10/06/2019   ALT 13 10/06/2019   PROT 8.5 10/06/2019   ALBUMIN 4.5 10/06/2019   CALCIUM 9.2 04/27/2020   ANIONGAP 11 04/27/2020   Lab Results  Component Value Date   CHOL 141 05/02/2018   Lab Results  Component Value Date   HDL 24 (L) 05/02/2018   Lab Results  Component Value Date   LDLCALC 55 05/02/2018   Lab Results  Component Value Date   TRIG 312 (H) 05/02/2018   Lab Results  Component Value Date   CHOLHDL 5.9 (H) 05/02/2018   Lab Results  Component Value Date   HGBA1C 9.5 (A) 06/24/2020      Assessment & Plan:   Problem List Items Addressed This Visit    None    Visit Diagnoses    Yeast UTI    -  Primary Empirical anbx treatment due to symptoms and history.  Urine culture pending Encourage completion of anbx even when symptoms improve.  Discussed resistance with anbx overuse Discussed allergic reactions with anbx.  Encourage increasing hydration with water and how to tell when this is achieved Add cranberry juice 100% 8-16 ozs daily until symptoms improve Discussed hygiene  Avoid not voiding when the urge presents    Relevant Orders   Urinalysis Dipstick (Completed)   Vaginal symptom     Patient education provided via handout   Relevant Orders   NuSwab Vaginitis (VG)      Meds ordered this encounter  Medications   saxagliptin HCl (ONGLYZA) 5 MG TABS tablet    Sig: Take 1 tablet (5 mg total) by mouth daily.    Dispense:  90 tablet    Refill:  3    Order Specific Question:   Supervising Provider    Answer:    Tresa Garter [[id number]]   fluconazole (DIFLUCAN) 150 MG tablet    Sig: Take 1 tablet (150 mg total) by mouth once for 1 dose.    Dispense:  1 tablet    Refill:  0    Order Specific Question:   Supervising Provider    Answer:   Tresa Garter [id number]    Follow-up: Return in about 6 weeks (around 10/23/2020).    Vevelyn Francois, NP

## 2020-09-11 NOTE — Patient Instructions (Signed)
Vaginal Yeast Infection, Adult  Vaginal yeast infection is a condition that causes vaginal discharge as well as soreness, swelling, and redness (inflammation) of the vagina. This is a common condition. Some women get this infection frequently. What are the causes? This condition is caused by a change in the normal balance of the yeast (candida) and bacteria that live in the vagina. This change causes an overgrowth of yeast, which causes the inflammation. What increases the risk? The condition is more likely to develop in women who:  Take antibiotic medicines.  Have diabetes.  Take birth control pills.  Are pregnant.  Douche often.  Have a weak body defense system (immune system).  Have been taking steroid medicines for a long time.  Frequently wear tight clothing. What are the signs or symptoms? Symptoms of this condition include:  White, thick, creamy vaginal discharge.  Swelling, itching, redness, and irritation of the vagina. The lips of the vagina (vulva) may be affected as well.  Pain or a burning feeling while urinating.  Pain during sex. How is this diagnosed? This condition is diagnosed based on:  Your medical history.  A physical exam.  A pelvic exam. Your health care provider will examine a sample of your vaginal discharge under a microscope. Your health care provider may send this sample for testing to confirm the diagnosis. How is this treated? This condition is treated with medicine. Medicines may be over-the-counter or prescription. You may be told to use one or more of the following:  Medicine that is taken by mouth (orally).  Medicine that is applied as a cream (topically).  Medicine that is inserted directly into the vagina (suppository). Follow these instructions at home:  Lifestyle  Do not have sex until your health care provider approves. Tell your sex partner that you have a yeast infection. That person should go to his or her health care  provider and ask if they should also be treated.  Do not wear tight clothes, such as pantyhose or tight pants.  Wear breathable cotton underwear. General instructions  Take or apply over-the-counter and prescription medicines only as told by your health care provider.  Eat more yogurt. This may help to keep your yeast infection from returning.  Do not use tampons until your health care provider approves.  Try taking a sitz bath to help with discomfort. This is a warm water bath that is taken while you are sitting down. The water should only come up to your hips and should cover your buttocks. Do this 3-4 times per day or as told by your health care provider.  Do not douche.  If you have diabetes, keep your blood sugar levels under control.  Keep all follow-up visits as told by your health care provider. This is important. Contact a health care provider if:  You have a fever.  Your symptoms go away and then return.  Your symptoms do not get better with treatment.  Your symptoms get worse.  You have new symptoms.  You develop blisters in or around your vagina.  You have blood coming from your vagina and it is not your menstrual period.  You develop pain in your abdomen. Summary  Vaginal yeast infection is a condition that causes discharge as well as soreness, swelling, and redness (inflammation) of the vagina.  This condition is treated with medicine. Medicines may be over-the-counter or prescription.  Take or apply over-the-counter and prescription medicines only as told by your health care provider.  Do not douche.   Do not have sex or use tampons until your health care provider approves.  Contact a health care provider if your symptoms do not get better with treatment or your symptoms go away and then return. This information is not intended to replace advice given to you by your health care provider. Make sure you discuss any questions you have with your health care  provider. Document Revised: 06/02/2019 Document Reviewed: 03/21/2018 Elsevier Patient Education  2020 [employer].  Diabetes Mellitus and Nutrition, Adult When you have diabetes (diabetes mellitus), it is very important to have healthy eating habits because your blood sugar (glucose) levels are greatly affected by what you eat and drink. Eating healthy foods in the appropriate amounts, at about the same times every day, can help you:  Control your blood glucose.  Lower your risk of heart disease.  Improve your blood pressure.  Reach or maintain a healthy weight. Every person with diabetes is different, and each person has different needs for a meal plan. Your health care provider may recommend that you work with a diet and nutrition specialist (dietitian) to make a meal plan that is best for you. Your meal plan may vary depending on factors such as:  The calories you need.  The medicines you take.  Your weight.  Your blood glucose, blood pressure, and cholesterol levels.  Your activity level.  Other health conditions you have, such as heart or kidney disease. How do carbohydrates affect me? Carbohydrates, also called carbs, affect your blood glucose level more than any other type of food. Eating carbs naturally raises the amount of glucose in your blood. Carb counting is a method for keeping track of how many carbs you eat. Counting carbs is important to keep your blood glucose at a healthy level, especially if you use insulin or take certain oral diabetes medicines. It is important to know how many carbs you can safely have in each meal. This is different for every person. Your dietitian can help you calculate how many carbs you should have at each meal and for each snack. Foods that contain carbs include:  Bread, cereal, rice, pasta, and crackers.  Potatoes and corn.  Peas, beans, and lentils.  Milk and yogurt.  Fruit and juice.  Desserts, such as cakes, cookies, ice  cream, and candy. How does alcohol affect me? Alcohol can cause a sudden decrease in blood glucose (hypoglycemia), especially if you use insulin or take certain oral diabetes medicines. Hypoglycemia can be a life-threatening condition. Symptoms of hypoglycemia (sleepiness, dizziness, and confusion) are similar to symptoms of having too much alcohol. If your health care provider says that alcohol is safe for you, follow these guidelines:  Limit alcohol intake to no more than 1 drink per day for nonpregnant women and 2 drinks per day for men. One drink equals 12 oz of beer, 5 oz of wine, or 1 oz of hard liquor.  Do not drink on an empty stomach.  Keep yourself hydrated with water, diet soda, or unsweetened iced tea.  Keep in mind that regular soda, juice, and other mixers may contain a lot of sugar and must be counted as carbs. What are tips for following this plan?  Reading food labels  Start by checking the serving size on the "Nutrition Facts" label of packaged foods and drinks. The amount of calories, carbs, fats, and other nutrients listed on the label is based on one serving of the item. Many items contain more than one serving per package.  Check the total grams (g) of carbs in one serving. You can calculate the number of servings of carbs in one serving by dividing the total carbs by 15. For example, if a food has 30 g of total carbs, it would be equal to 2 servings of carbs.  Check the number of grams (g) of saturated and trans fats in one serving. Choose foods that have low or no amount of these fats.  Check the number of milligrams (mg) of salt (sodium) in one serving. Most people should limit total sodium intake to less than 2,300 mg per day.  Always check the nutrition information of foods labeled as "low-fat" or "nonfat". These foods may be higher in added sugar or refined carbs and should be avoided.  Talk to your dietitian to identify your daily goals for nutrients listed on  the label. Shopping  Avoid buying canned, premade, or processed foods. These foods tend to be high in fat, sodium, and added sugar.  Shop around the outside edge of the grocery store. This includes fresh fruits and vegetables, bulk grains, fresh meats, and fresh dairy. Cooking  Use low-heat cooking methods, such as baking, instead of high-heat cooking methods like deep frying.  Cook using healthy oils, such as olive, canola, or sunflower oil.  Avoid cooking with butter, cream, or high-fat meats. Meal planning  Eat meals and snacks regularly, preferably at the same times every day. Avoid going long periods of time without eating.  Eat foods high in fiber, such as fresh fruits, vegetables, beans, and whole grains. Talk to your dietitian about how many servings of carbs you can eat at each meal.  Eat 4-6 ounces (oz) of lean protein each day, such as lean meat, chicken, fish, eggs, or tofu. One oz of lean protein is equal to: ? 1 oz of meat, chicken, or fish. ? 1 egg. ?  cup of tofu.  Eat some foods each day that contain healthy fats, such as avocado, nuts, seeds, and fish. Lifestyle  Check your blood glucose regularly.  Exercise regularly as told by your health care provider. This may include: ? 150 minutes of moderate-intensity or vigorous-intensity exercise each week. This could be brisk walking, biking, or water aerobics. ? Stretching and doing strength exercises, such as yoga or weightlifting, at least 2 times a week.  Take medicines as told by your health care provider.  Do not use any products that contain nicotine or tobacco, such as cigarettes and e-cigarettes. If you need help quitting, ask your health care provider.  Work with a Veterinary surgeon or diabetes educator to identify strategies to manage stress and any emotional and social challenges. Questions to ask a health care provider  Do I need to meet with a diabetes educator?  Do I need to meet with a dietitian?  What  number can I call if I have questions?  When are the best times to check my blood glucose? Where to find more information:  American Diabetes Association: diabetes.org  Academy of Nutrition and Dietetics: www.eatright.[employer] of Diabetes and Digestive and Kidney Diseases (NIH): CarFli[employer].tn Summary  A healthy meal plan will help you control your blood glucose and maintain a healthy lifestyle.  Working with a diet and nutrition specialist (dietitian) can help you make a meal plan that is best for you.  Keep in mind that carbohydrates (carbs) and alcohol have immediate effects on your blood glucose levels. It is important to count carbs and to use alcohol carefully. This  information is not intended to replace advice given to you by your health care provider. Make sure you discuss any questions you have with your health care provider. Document Revised: 10/15/2017 Document Reviewed: 12/07/2016 Elsevier Patient Education  2020 [employer].

## 2020-09-13 ENCOUNTER — Encounter: Payer: Self-pay | Admitting: Nurse Practitioner

## 2020-09-14 LAB — NUSWAB VAGINITIS (VG)
Candida albicans, NAA: POSITIVE — AB
Candida glabrata, NAA: NEGATIVE
Trich vag by NAA: NEGATIVE

## 2020-09-16 ENCOUNTER — Other Ambulatory Visit: Payer: Self-pay | Admitting: Nurse Practitioner

## 2020-09-16 MED ORDER — METRONIDAZOLE 500 MG PO TABS
500.0000 mg | ORAL_TABLET | Freq: Three times a day (TID) | ORAL | 0 refills | Status: AC
Start: 2020-09-16 — End: 2020-09-23

## 2020-09-23 ENCOUNTER — Encounter: Payer: Self-pay | Admitting: Family Medicine

## 2020-09-25 ENCOUNTER — Other Ambulatory Visit: Payer: Self-pay | Admitting: Family Medicine

## 2020-09-25 ENCOUNTER — Telehealth: Payer: Self-pay | Admitting: Family Medicine

## 2020-09-25 DIAGNOSIS — B3749 Other urogenital candidiasis: Secondary | ICD-10-CM

## 2020-09-25 MED ORDER — FLUCONAZOLE 150 MG PO TABS: 150.0000 mg | ORAL_TABLET | Freq: Once | ORAL | 0 refills | Status: AC

## 2020-09-26 NOTE — Telephone Encounter (Signed)
Sent to provider 

## 2020-10-01 ENCOUNTER — Telehealth: Payer: Self-pay | Admitting: Family Medicine

## 2020-10-01 NOTE — Telephone Encounter (Signed)
Pt was called about awv w/ pcc. Unable to lvm bc vm box was full

## 2020-10-23 ENCOUNTER — Encounter: Payer: Self-pay | Admitting: Family Medicine

## 2020-10-23 ENCOUNTER — Other Ambulatory Visit: Payer: Self-pay

## 2020-10-23 ENCOUNTER — Ambulatory Visit (INDEPENDENT_AMBULATORY_CARE_PROVIDER_SITE_OTHER): Payer: 59 | Admitting: Family Medicine

## 2020-10-23 ENCOUNTER — Ambulatory Visit: Payer: 59 | Admitting: Family Medicine

## 2020-10-23 VITALS — BP 148/90 | HR 102 | Temp 97.5°F | Resp 17 | Wt 359.0 lb

## 2020-10-23 DIAGNOSIS — E119 Type 2 diabetes mellitus without complications: Secondary | ICD-10-CM

## 2020-10-23 DIAGNOSIS — R739 Hyperglycemia, unspecified: Secondary | ICD-10-CM | POA: Diagnosis not present

## 2020-10-23 DIAGNOSIS — R7309 Other abnormal glucose: Secondary | ICD-10-CM

## 2020-10-23 DIAGNOSIS — R8290 Unspecified abnormal findings in urine: Secondary | ICD-10-CM

## 2020-10-23 DIAGNOSIS — L299 Pruritus, unspecified: Secondary | ICD-10-CM

## 2020-10-23 DIAGNOSIS — R519 Headache, unspecified: Secondary | ICD-10-CM

## 2020-10-23 DIAGNOSIS — Z09 Encounter for follow-up examination after completed treatment for conditions other than malignant neoplasm: Secondary | ICD-10-CM

## 2020-10-23 LAB — POCT GLYCOSYLATED HEMOGLOBIN (HGB A1C): Hemoglobin A1C: 10.5 % — AB (ref 4.0–5.6)

## 2020-10-23 LAB — GLUCOSE, POCT (MANUAL RESULT ENTRY): POC Glucose: 186 mg/dl — AB (ref 70–99)

## 2020-10-23 MED ORDER — KETOROLAC TROMETHAMINE 30 MG/ML IJ SOLN
30.0000 mg | Freq: Once | INTRAMUSCULAR | Status: AC
  Administered 2020-10-23: 30 mg via INTRAMUSCULAR

## 2020-10-23 MED ORDER — HYDROXYZINE HCL 10 MG PO TABS: 10.0000 mg | ORAL_TABLET | Freq: Three times a day (TID) | ORAL | 2 refills | Status: DC | PRN

## 2020-10-23 MED ORDER — GLUCOSE BLOOD VI STRP: ORAL_STRIP | 3 refills | Status: DC

## 2020-10-23 NOTE — Patient Instructions (Signed)
Hydroxyzine capsules or tablets What is this medicine? HYDROXYZINE (hye [place] i zeen) is an antihistamine. This medicine is used to treat allergy symptoms. It is also used to treat anxiety and tension. This medicine can be used with other medicines to induce sleep before surgery. This medicine may be used for other purposes; ask your health care provider or pharmacist if you have questions. COMMON BRAND NAME(S): ANX, Atarax, Rezine, Vistaril What should I tell my health care provider before I take this medicine? They need to know if you have any of these conditions:  glaucoma  heart disease  history of irregular heartbeat  kidney disease  liver disease  lung or breathing disease, like asthma  stomach or intestine problems  thyroid disease  trouble passing urine  an unusual or allergic reaction to hydroxyzine, cetirizine, other medicines, foods, dyes or preservatives  pregnant or trying to get pregnant  breast-feeding How should I use this medicine? Take this medicine by mouth with a full glass of water. Follow the directions on the prescription label. You may take this medicine with food or on an empty stomach. Take your medicine at regular intervals. Do not take your medicine more often than directed. Talk to your pediatrician regarding the use of this medicine in children. Special care may be needed. While this drug may be prescribed for children as young as 30 years of age for selected conditions, precautions do apply. Patients over 30 years old may have a stronger reaction and need a smaller dose. Overdosage: If you think you have taken too much of this medicine contact a poison control center or emergency room at once. NOTE: This medicine is only for you. Do not share this medicine with others. What if I miss a dose? If you miss a dose, take it as soon as you can. If it is almost time for your next dose, take only that dose. Do not take double or extra doses. What may  interact with this medicine? Do not take this medicine with any of the following medications:  cisapride  dronedarone  pimozide  thioridazine This medicine may also interact with the following medications:  alcohol  antihistamines for allergy, cough, and cold  atropine  barbiturate medicines for sleep or seizures, like phenobarbital  certain antibiotics like erythromycin or clarithromycin  certain medicines for anxiety or sleep  certain medicines for bladder problems like oxybutynin, tolterodine  certain medicines for depression or psychotic disturbances  certain medicines for irregular heart beat  certain medicines for Parkinson's disease like benztropine, trihexyphenidyl  certain medicines for seizures like phenobarbital, primidone  certain medicines for stomach problems like dicyclomine, hyoscyamine  certain medicines for travel sickness like scopolamine  ipratropium  narcotic medicines for pain  other medicines that prolong the QT interval (an abnormal heart rhythm) like dofetilide This list may not describe all possible interactions. Give your health care provider a list of all the medicines, herbs, non-prescription drugs, or dietary supplements you use. Also tell them if you smoke, drink alcohol, or use illegal drugs. Some items may interact with your medicine. What should I watch for while using this medicine? Tell your doctor or health care professional if your symptoms do not improve. You may get drowsy or dizzy. Do not drive, use machinery, or do anything that needs mental alertness until you know how this medicine affects you. Do not stand or sit up quickly, especially if you are an older patient. This reduces the risk of dizzy or fainting spells. Alcohol may  interfere with the effect of this medicine. Avoid alcoholic drinks. Your mouth may get dry. Chewing sugarless gum or sucking hard candy, and drinking plenty of water may help. Contact your doctor if the  problem does not go away or is severe. This medicine may cause dry eyes and blurred vision. If you wear contact lenses you may feel some discomfort. Lubricating drops may help. See your eye doctor if the problem does not go away or is severe. If you are receiving skin tests for allergies, tell your doctor you are using this medicine. What side effects may I notice from receiving this medicine? Side effects that you should report to your doctor or health care professional as soon as possible:  allergic reactions like skin rash, itching or hives, swelling of the face, lips, or tongue  changes in vision  confusion  fast, irregular heartbeat  seizures  tremor  trouble passing urine or change in the amount of urine Side effects that usually do not require medical attention (report to your doctor or health care professional if they continue or are bothersome):  constipation  drowsiness  dry mouth  headache  tiredness This list may not describe all possible side effects. Call your doctor for medical advice about side effects. You may report side effects to FDA at 1-800-FDA-1088. Where should I keep my medicine? Keep out of the reach of children. Store at room temperature between 15 and 30 degrees C (59 and 86 degrees F). Keep container tightly closed. Throw away any unused medicine after the expiration date. NOTE: This sheet is a summary. It may not cover all possible information. If you have questions about this medicine, talk to your doctor, pharmacist, or health care provider.  2020 Elsevier/Gold Standard (2018-10-24 13:19:55) Pruritus Pruritus is an itchy feeling on the skin. One of the most common causes is dry skin, but many different things can cause itching. Most cases of itching do not require medical attention. Sometimes itchy skin can turn into a rash. Follow these instructions at home: Skin care   Apply moisturizing lotion to your skin as needed. Lotion that contains  petroleum jelly is best.  Take medicines or apply medicated creams only as told by your health care provider. This may include: ? Corticosteroid cream. ? Anti-itch lotions. ? Oral antihistamines.  Apply a cool, wet cloth (cool compress) to the affected areas.  Take baths with one of the following: ? Epsom salts. You can get these at your local pharmacy or grocery store. Follow the instructions on the packaging. ? Baking soda. Pour a small amount into the bath as told by your health care provider. ? Colloidal oatmeal. You can get this at your local pharmacy or grocery store. Follow the instructions on the packaging.  Apply baking soda paste to your skin. To make the paste, stir water into a small amount of baking soda until it reaches a paste-like consistency.  Do not scratch your skin.  Do not take hot showers or baths, which can make itching worse. A cool shower may help with itching as long as you apply moisturizing lotion after the shower.  Do not use scented soaps, detergents, perfumes, and cosmetic products. Instead, use gentle, unscented versions of these items. General instructions  Avoid wearing tight clothes.  Keep a journal to help find out what is causing your itching. Write down: ? What you eat and drink. ? What cosmetic products you use. ? What soaps or detergents you use. ? What you wear, including   jewelry.  Use a humidifier. This keeps the air moist, which helps to prevent dry skin.  Be aware of any changes in your itchiness. Contact a health care provider if:  The itching does not go away after several days.  You are unusually thirsty or urinating more than normal.  Your skin tingles or feels numb.  Your skin or the white parts of your eyes turn yellow (jaundice).  You feel weak.  You have any of the following: ? Night sweats. ? Tiredness (fatigue). ? Weight loss. ? Abdominal pain. Summary  Pruritus is an itchy feeling on the skin. One of the most  common causes is dry skin, but many different conditions and factors can cause itching.  Apply moisturizing lotion to your skin as needed. Lotion that contains petroleum jelly is best.  Take medicines or apply medicated creams only as told by your health care provider.  Do not take hot showers or baths. Do not use scented soaps, detergents, perfumes, or cosmetic products. This information is not intended to replace advice given to you by your health care provider. Make sure you discuss any questions you have with your health care provider. Document Revised: 11/16/2017 Document Reviewed: 11/16/2017 Elsevier Patient Education  2020 [employer].

## 2020-10-23 NOTE — Progress Notes (Signed)
Patient [facility]   Established Patient Office Visit  Subjective:  Patient ID: Grace Daniels, female    DOB: 12/14/1990  Age: 29 y.o. MRN: [id number]  CC:  Chief Complaint  Patient presents with  . Diabetes    states that FSBS have been trending downward. denies N, V, polyuria, polydipsia,  . Hypertension    HPI Grace Daniels is a 29 year old female who presents for Follow Up today.    Patient Active Problem List   Diagnosis Date Noted  . Acute pain of right thigh 06/24/2020  . Acute pain of right knee 06/24/2020  . Vaginal itching 08/01/2019  . Vaginal burning 08/01/2019  . Yeast infection 06/07/2019  . Hemoglobin A1C greater than 9%, indicating poor diabetic control 05/14/2019  . Type 2 diabetes mellitus without complication, without long-term current use of insulin ([place]) 03/08/2019  . Elevated glucose 03/08/2019  . Recent weight loss 03/08/2019  . Obstructive sleep apnea 08/02/2017  . Morbid obesity due to excess calories ([place]) 08/02/2017  . HTN (hypertension) 04/26/2017  . Abnormal uterine bleeding (AUB) 03/30/2016   Current Status: Since her last office visit, Grace Daniels is doing well with no complaints. Grace Daniels denies visual changes, chest pain, cough, shortness of breath, heart palpitations, and falls. Grace Daniels reports headaches lately. Denies severe headaches, confusion, seizures, double vision, and blurred vision, nausea and vomiting. Grace Daniels had not been monitoring her blood glucose levels regularly. Grace Daniels denies fatigue, frequent urination, blurred vision, excessive hunger, excessive thirst, weight gain, weight loss, and poor wound healing. Grace Daniels continues to check her feet regularly. Grace Daniels denies fevers, chills, fatigue, recent infections, weight loss, and night sweats. Denies GI problems such as diarrhea, and constipation. Grace Daniels has no reports of blood in stools, dysuria and hematuria. No depression or anxiety reported today. Grace Daniels is taking all  medications as prescribed. Grace Daniels denies pain today.   Past Medical History:  Diagnosis Date  . Diabetes ([place])   . Hyperlipidemia   . Hypertension   . Morbid obesity ([place])   . Right knee pain 05/2020  . Right thigh pain 05/2020  . Sleep apnea     No past surgical history on file.  No family history on file.  Social History   Socioeconomic History  . Marital status: Single    Spouse name: Not on file  . Number of children: Not on file  . Years of education: Not on file  . Highest education level: Not on file  Occupational History  . Not on file  Tobacco Use  . Smoking status: Never Smoker  . Smokeless tobacco: Never Used  Vaping Use  . Vaping Use: Never used  Substance and Sexual Activity  . Alcohol use: No  . Drug use: No  . Sexual activity: Not Currently  Other Topics Concern  . Not on file  Social History Narrative  . Not on file   Social Determinants of Health   Financial Resource Strain:   . Difficulty of Paying Living Expenses: Not on file  Food Insecurity:   . Worried About Charity fundraiser in the Last Year: Not on file  . Ran Out of Food in the Last Year: Not on file  Transportation Needs:   . Lack of Transportation (Medical): Not on file  . Lack of Transportation (Non-Medical): Not on file  Physical Activity:   . Days of Exercise per Week: Not on file  . Minutes of Exercise per Session: Not on  file  Stress:   . Feeling of Stress : Not on file  Social Connections:   . Frequency of Communication with Friends and Family: Not on file  . Frequency of Social Gatherings with Friends and Family: Not on file  . Attends Religious Services: Not on file  . Active Member of Clubs or Organizations: Not on file  . Attends Archivist Meetings: Not on file  . Marital Status: Not on file  Intimate Partner Violence:   . Fear of Current or Ex-Partner: Not on file  . Emotionally Abused: Not on file  . Physically Abused: Not on file  . Sexually Abused:  Not on file    Outpatient Medications Prior to Visit  Medication Sig Dispense Refill  . atorvastatin (LIPITOR) 10 MG tablet Take 1 tablet (10 mg total) by mouth daily. 90 tablet 3  . blood glucose meter kit and supplies KIT Dispense based on patient and insurance preference. Use up to four times daily as directed. (FOR ICD-9 250.00, 250.01). 1 each 0  . Blood Glucose Monitoring Suppl (ACURA BLOOD GLUCOSE METER) w/Device KIT 1 applicator by Does not apply route 4 (four) times daily -  before meals and at bedtime. 1 kit 1  . glipiZIDE (GLUCOTROL) 10 MG tablet Take 1 tablet (10 mg total) by mouth 2 (two) times daily. 180 tablet 3  . metFORMIN (GLUCOPHAGE) 1000 MG tablet Take 1 tablet (1,000 mg total) by mouth 2 (two) times daily with a meal. 180 tablet 3  . saxagliptin HCl (ONGLYZA) 5 MG TABS tablet Take 1 tablet (5 mg total) by mouth daily. 90 tablet 3  . triamterene-hydrochlorothiazide (DYAZIDE) 37.5-25 MG capsule TAKE 1 EACH (1 CAPSULE TOTAL) BY MOUTH DAILY. 90 capsule 3  . glucose blood test strip Use as instructed 100 each 12  . dapagliflozin propanediol (FARXIGA) 10 MG TABS tablet Take 1 tablet (10 mg total) by mouth daily. (Patient not taking: Reported on 09/11/2020) 90 tablet 3   No facility-administered medications prior to visit.   No Known Allergies  ROS Review of Systems  Constitutional: Negative.   HENT: Negative.   Eyes: Negative.   Respiratory: Negative.   Cardiovascular: Negative.   Gastrointestinal: Positive for abdominal distention (obese).  Endocrine: Negative.   Genitourinary: Negative.   Musculoskeletal: Positive for arthralgias (generalized).  Skin: Negative.   Allergic/Immunologic: Negative.   Neurological: Positive for dizziness (occasional ) and headaches (current).  Hematological: Negative.   Psychiatric/Behavioral: Negative.       Objective:    Physical Exam Vitals and nursing note reviewed.  Constitutional:      Appearance: Normal appearance.   HENT:     Head: Normocephalic and atraumatic.     Nose: Nose normal.     Mouth/Throat:     Mouth: Mucous membranes are moist.     Pharynx: Oropharynx is clear.  Cardiovascular:     Rate and Rhythm: Normal rate and regular rhythm.     Pulses: Normal pulses.     Heart sounds: Normal heart sounds.  Pulmonary:     Effort: Pulmonary effort is normal.     Breath sounds: Normal breath sounds.  Abdominal:     General: Bowel sounds are normal. There is distension (obese).     Palpations: Abdomen is soft.  Musculoskeletal:        General: Normal range of motion.     Cervical back: Normal range of motion and neck supple.  Skin:    General: Skin is warm and dry.  Neurological:     General: No focal deficit present.     Mental Status: Grace Daniels is alert and oriented to person, place, and time.  Psychiatric:        Mood and Affect: Mood normal.        Behavior: Behavior normal.        Thought Content: Thought content normal.        Judgment: Judgment normal.     BP (!) 148/90   Pulse (!) 102   Temp (!) 97.5 F (36.4 C) (Temporal)   Resp 17   Wt (!) 359 lb (162.8 kg)   SpO2 96%   BMI 51.51 kg/m  Wt Readings from Last 3 Encounters:  10/23/20 (!) 359 lb (162.8 kg)  09/11/20 (!) 361 lb 3.2 oz (163.8 kg)  06/24/20 (!) 372 lb (168.7 kg)     Health Maintenance Due  Topic Date Due  . PNEUMOCOCCAL POLYSACCHARIDE VACCINE AGE 34-64 HIGH RISK  Never done  . OPHTHALMOLOGY EXAM  Never done  . COVID-19 Vaccine (1) Never done  . TETANUS/TDAP  Never done  . FOOT EXAM  11/01/2018  . INFLUENZA VACCINE  Never done    There are no preventive care reminders to display for this patient.  Lab Results  Component Value Date   TSH 2.050 05/02/2018   Lab Results  Component Value Date   WBC 9.7 04/27/2020   HGB 11.3 (L) 04/27/2020   HCT 35.9 (L) 04/27/2020   MCV 64.2 (L) 04/27/2020   PLT 425 (H) 04/27/2020   Lab Results  Component Value Date   NA 137 04/27/2020   K 4.1 04/27/2020   CO2 23  04/27/2020   GLUCOSE 365 (H) 04/27/2020   BUN 14 04/27/2020   CREATININE 0.81 04/27/2020   BILITOT <0.2 10/06/2019   ALKPHOS 75 10/06/2019   AST 13 10/06/2019   ALT 13 10/06/2019   PROT 8.5 10/06/2019   ALBUMIN 4.5 10/06/2019   CALCIUM 9.2 04/27/2020   ANIONGAP 11 04/27/2020   Lab Results  Component Value Date   CHOL 141 05/02/2018   Lab Results  Component Value Date   HDL 24 (L) 05/02/2018   Lab Results  Component Value Date   LDLCALC 55 05/02/2018   Lab Results  Component Value Date   TRIG 312 (H) 05/02/2018   Lab Results  Component Value Date   CHOLHDL 5.9 (H) 05/02/2018   Lab Results  Component Value Date   HGBA1C 10.5 (A) 10/23/2020    Assessment & Plan:   1. Type 2 diabetes mellitus without complication, without long-term current use of insulin (HCC) Grace Daniels will continue medication as prescribed, to decrease foods/beverages high in sugars and carbs and follow Heart Healthy or DASH diet. Increase physical activity to at least 30 minutes cardio exercise daily.  - glucose blood test strip; Use as instructed  Dispense: 300 each; Refill: 3  2. Hemoglobin A1C greater than 9%, indicating poor diabetic control Worsened. Hgb A1c increased at 10.5, from 9.5 on 06/24/2020. Monitor.  - Glucose (CBG) - HgB A1c  3. Hyperglycemia  4. Elevated blood sugar - glucose blood test strip; Use as instructed  Dispense: 300 each; Refill: 3  5. Acute nonintractable headache, unspecified headache type - ketorolac (TORADOL) 30 MG/ML injection 30 mg  6. Abnormal urinalysis - glucose blood test strip; Use as instructed  Dispense: 300 each; Refill: 3  7. Itching - hydrOXYzine (ATARAX/VISTARIL) 10 MG tablet; Take 1 tablet (10 mg total) by mouth 3 (three) times daily  as needed for itching.  Dispense: 90 tablet; Refill: 2  8. Follow up Grace Daniels will follow up in 3 months.   Current Outpatient Medications on File Prior to Visit  Medication Sig Dispense Refill  . atorvastatin (LIPITOR) 10  MG tablet Take 1 tablet (10 mg total) by mouth daily. 90 tablet 3  . blood glucose meter kit and supplies KIT Dispense based on patient and insurance preference. Use up to four times daily as directed. (FOR ICD-9 250.00, 250.01). 1 each 0  . Blood Glucose Monitoring Suppl (ACURA BLOOD GLUCOSE METER) w/Device KIT 1 applicator by Does not apply route 4 (four) times daily -  before meals and at bedtime. 1 kit 1  . glipiZIDE (GLUCOTROL) 10 MG tablet Take 1 tablet (10 mg total) by mouth 2 (two) times daily. 180 tablet 3  . metFORMIN (GLUCOPHAGE) 1000 MG tablet Take 1 tablet (1,000 mg total) by mouth 2 (two) times daily with a meal. 180 tablet 3  . saxagliptin HCl (ONGLYZA) 5 MG TABS tablet Take 1 tablet (5 mg total) by mouth daily. 90 tablet 3  . triamterene-hydrochlorothiazide (DYAZIDE) 37.5-25 MG capsule TAKE 1 EACH (1 CAPSULE TOTAL) BY MOUTH DAILY. 90 capsule 3   No current facility-administered medications on file prior to visit.    Orders Placed This Encounter  Procedures  . Glucose (CBG)  . HgB A1c    Referral Orders  No referral(s) requested today    Kathe Becton, MSN, ANE, FNP-BC [address] [place], [place] [address] [phone] fax   Problem List Items Addressed This Visit      Endocrine   Hemoglobin A1C greater than 9%, indicating poor diabetic control   Relevant Orders   Glucose (CBG) (Completed)   HgB A1c (Completed)   Type 2 diabetes mellitus without complication, without long-term current use of insulin (HCC) - Primary   Relevant Medications   glucose blood test strip    Other Visit Diagnoses    Hyperglycemia       Elevated blood sugar       Relevant Medications   glucose blood test strip   Acute nonintractable headache, unspecified headache type       Relevant Medications   ketorolac (TORADOL) 30 MG/ML injection 30 mg (Completed)   Abnormal  urinalysis       Relevant Medications   glucose blood test strip   Itching       Relevant Medications   hydrOXYzine (ATARAX/VISTARIL) 10 MG tablet   Follow up          Meds ordered this encounter  Medications  . ketorolac (TORADOL) 30 MG/ML injection 30 mg  . hydrOXYzine (ATARAX/VISTARIL) 10 MG tablet    Sig: Take 1 tablet (10 mg total) by mouth 3 (three) times daily as needed for itching.    Dispense:  90 tablet    Refill:  2  . glucose blood test strip    Sig: Use as instructed    Dispense:  300 each    Refill:  3    Follow-up: No follow-ups on file.    Azzie Glatter, FNP

## 2020-11-06 ENCOUNTER — Other Ambulatory Visit: Payer: Self-pay | Admitting: Family Medicine

## 2020-11-06 DIAGNOSIS — L299 Pruritus, unspecified: Secondary | ICD-10-CM

## 2020-11-16 DIAGNOSIS — B342 Coronavirus infection, unspecified: Secondary | ICD-10-CM

## 2020-11-16 HISTORY — DX: Coronavirus infection, unspecified: B34.2

## 2020-11-19 ENCOUNTER — Other Ambulatory Visit: Payer: Self-pay | Admitting: Nurse Practitioner

## 2020-11-19 ENCOUNTER — Other Ambulatory Visit: Payer: Self-pay | Admitting: Family Medicine

## 2020-11-19 DIAGNOSIS — R7309 Other abnormal glucose: Secondary | ICD-10-CM

## 2020-11-19 DIAGNOSIS — R739 Hyperglycemia, unspecified: Secondary | ICD-10-CM

## 2020-11-19 DIAGNOSIS — E119 Type 2 diabetes mellitus without complications: Secondary | ICD-10-CM

## 2020-11-19 MED ORDER — SITAGLIPTIN PHOSPHATE 100 MG PO TABS: 100.0000 mg | ORAL_TABLET | Freq: Every day | ORAL | 0 refills | Status: DC

## 2020-11-19 NOTE — Telephone Encounter (Signed)
Do you want to change this medication?

## 2020-12-03 ENCOUNTER — Other Ambulatory Visit: Payer: Self-pay

## 2020-12-03 DIAGNOSIS — E119 Type 2 diabetes mellitus without complications: Secondary | ICD-10-CM | POA: Diagnosis not present

## 2020-12-03 DIAGNOSIS — Z7984 Long term (current) use of oral hypoglycemic drugs: Secondary | ICD-10-CM | POA: Diagnosis not present

## 2020-12-03 DIAGNOSIS — I10 Essential (primary) hypertension: Secondary | ICD-10-CM | POA: Insufficient documentation

## 2020-12-03 DIAGNOSIS — U071 COVID-19: Secondary | ICD-10-CM | POA: Diagnosis not present

## 2020-12-03 DIAGNOSIS — R059 Cough, unspecified: Secondary | ICD-10-CM | POA: Diagnosis present

## 2020-12-03 NOTE — ED Triage Notes (Signed)
covid symptoms x 1 week. Also reports that she has a yeast infection.

## 2020-12-04 ENCOUNTER — Emergency Department (HOSPITAL_BASED_OUTPATIENT_CLINIC_OR_DEPARTMENT_OTHER)
Admission: EM | Admit: 2020-12-04 | Discharge: 2020-12-04 | Disposition: A | Discharge status: Home / Self Care | Payer: HRSA Program | Attending: Emergency Medicine | Admitting: Emergency Medicine

## 2020-12-04 ENCOUNTER — Encounter (HOSPITAL_BASED_OUTPATIENT_CLINIC_OR_DEPARTMENT_OTHER): Payer: Self-pay | Admitting: *Deleted

## 2020-12-04 DIAGNOSIS — Z20822 Contact with and (suspected) exposure to covid-19: Secondary | ICD-10-CM

## 2020-12-04 LAB — SARS CORONAVIRUS 2 (TAT 6-24 HRS): SARS Coronavirus 2: POSITIVE — AB

## 2020-12-04 LAB — WET PREP, GENITAL
Clue Cells Wet Prep HPF POC: NONE SEEN
Sperm: NONE SEEN
Trich, Wet Prep: NONE SEEN
WBC, Wet Prep HPF POC: NONE SEEN
Yeast Wet Prep HPF POC: NONE SEEN

## 2020-12-04 MED ORDER — ALBUTEROL SULFATE HFA 108 (90 BASE) MCG/ACT IN AERS: 1.0000 | INHALATION_SPRAY | Freq: Four times a day (QID) | RESPIRATORY_TRACT | 0 refills | Status: DC | PRN

## 2020-12-04 NOTE — ED Provider Notes (Addendum)
[facility] Provider Note   CSN: [id number] Arrival date & time: 12/03/20  2353     History Chief Complaint  Patient presents with  . Cough    Grace Daniels is a 30 y.o. female.  The history is provided by the patient.  Cough Cough characteristics:  Non-productive Severity:  Moderate Onset quality:  Gradual Duration:  1 week Timing:  Sporadic Chronicity:  New Smoker: no   Context: upper respiratory infection   Relieved by:  Nothing Worsened by:  Nothing Ineffective treatments:  None tried Associated symptoms: sinus congestion   Associated symptoms: no chest pain, no fever, no rash and no shortness of breath   Risk factors: no recent infection   Patient with covid exposure and is not vaccinated.  With cough and congestion.  Also wants to be checked for yeast infection.       Past Medical History:  Diagnosis Date  . Diabetes ([place])   . Hyperlipidemia   . Hypertension   . Morbid obesity ([place])   . Right knee pain 05/2020  . Right thigh pain 05/2020  . Sleep apnea     Patient Active Problem List   Diagnosis Date Noted  . Acute pain of right thigh 06/24/2020  . Acute pain of right knee 06/24/2020  . Vaginal itching 08/01/2019  . Vaginal burning 08/01/2019  . Yeast infection 06/07/2019  . Hemoglobin A1C greater than 9%, indicating poor diabetic control 05/14/2019  . Type 2 diabetes mellitus without complication, without long-term current use of insulin ([place]) 03/08/2019  . Elevated glucose 03/08/2019  . Recent weight loss 03/08/2019  . Obstructive sleep apnea 08/02/2017  . Morbid obesity due to excess calories ([place]) 08/02/2017  . HTN (hypertension) 04/26/2017  . Abnormal uterine bleeding (AUB) 03/30/2016    History reviewed. No pertinent surgical history.   OB History    Gravida  0   Para      Term      Preterm      AB      Living  0     SAB      IAB      Ectopic      Multiple      Live Births               History reviewed. No pertinent family history.  Social History   Tobacco Use  . Smoking status: Never Smoker  . Smokeless tobacco: Never Used  Vaping Use  . Vaping Use: Never used  Substance Use Topics  . Alcohol use: No  . Drug use: No    Home Medications Prior to Admission medications   Medication Sig Start Date End Date Taking? Authorizing Provider  atorvastatin (LIPITOR) 10 MG tablet Take 1 tablet (10 mg total) by mouth daily. 06/24/20   Azzie Glatter, FNP  blood glucose meter kit and supplies KIT Dispense based on patient and insurance preference. Use up to four times daily as directed. (FOR ICD-9 250.00, 250.01). 01/27/19   Lanae Boast, FNP  Blood Glucose Monitoring Suppl [facility] BLOOD GLUCOSE METER) w/Device KIT 1 applicator by Does not apply route 4 (four) times daily -  before meals and at bedtime. 05/16/18   Azzie Glatter, FNP  glipiZIDE (GLUCOTROL) 10 MG tablet Take 1 tablet (10 mg total) by mouth 2 (two) times daily. 06/24/20   Azzie Glatter, FNP  glucose blood test strip Use as instructed 10/23/20   Azzie Glatter, FNP  hydrOXYzine (ATARAX/VISTARIL)  10 MG tablet Take 1 tablet (10 mg total) by mouth 3 (three) times daily as needed for itching. 10/23/20   Azzie Glatter, FNP  metFORMIN (GLUCOPHAGE) 1000 MG tablet Take 1 tablet (1,000 mg total) by mouth 2 (two) times daily with a meal. 06/24/20 06/24/21  Azzie Glatter, FNP  sitaGLIPtin (JANUVIA) 100 MG tablet Take 1 tablet (100 mg total) by mouth daily. 11/19/20   Azzie Glatter, FNP  triamterene-hydrochlorothiazide (DYAZIDE) 37.5-25 MG capsule TAKE 1 EACH (1 CAPSULE TOTAL) BY MOUTH DAILY. 06/24/20   Azzie Glatter, FNP    Allergies    Patient has no known allergies.  Review of Systems   Review of Systems  Constitutional: Negative for fever.  HENT: Negative for congestion.   Eyes: Negative for visual disturbance.  Respiratory: Positive for cough. Negative for shortness of breath.   Cardiovascular:  Negative for chest pain.  Gastrointestinal: Negative for abdominal pain.  Genitourinary: Negative for difficulty urinating.  Musculoskeletal: Negative for arthralgias.  Skin: Negative for rash.  Neurological: Negative for dizziness.  Psychiatric/Behavioral: Negative for agitation.  All other systems reviewed and are negative.   Physical Exam Updated Vital Signs BP (!) 151/85 (BP Location: Left Arm)   Pulse (!) 109   Temp 98.1 F (36.7 C) (Oral)   Resp 18   Ht 5' 10" (1.778 m)   Wt (!) 162.8 kg   SpO2 98%   BMI 51.50 kg/m   Physical Exam Vitals and nursing note reviewed.  Constitutional:      General: She is not in acute distress.    Appearance: Normal appearance.  HENT:     Head: Normocephalic and atraumatic.     Nose: Nose normal.  Eyes:     Conjunctiva/sclera: Conjunctivae normal.     Pupils: Pupils are equal, round, and reactive to light.  Cardiovascular:     Rate and Rhythm: Normal rate and regular rhythm.     Pulses: Normal pulses.     Heart sounds: Normal heart sounds.  Pulmonary:     Effort: Pulmonary effort is normal.     Breath sounds: Normal breath sounds.  Abdominal:     General: Abdomen is flat. Bowel sounds are normal.     Palpations: Abdomen is soft.     Tenderness: There is no abdominal tenderness. There is no guarding or rebound.  Musculoskeletal:        General: Normal range of motion.     Cervical back: Normal range of motion and neck supple.  Skin:    General: Skin is warm and dry.     Capillary Refill: Capillary refill takes less than 2 seconds.  Neurological:     General: No focal deficit present.     Mental Status: She is alert and oriented to person, place, and time.     Deep Tendon Reflexes: Reflexes normal.  Psychiatric:        Mood and Affect: Mood normal.        Behavior: Behavior normal.     ED Results / Procedures / Treatments   Labs (all labs ordered are listed, but only abnormal results are displayed) Results for orders  placed or performed during the hospital encounter of 12/04/20  Wet prep, genital   Specimen: Cervix  Result Value Ref Range   Yeast Wet Prep HPF POC NONE SEEN NONE SEEN   Trich, Wet Prep NONE SEEN NONE SEEN   Clue Cells Wet Prep HPF POC NONE SEEN NONE SEEN   WBC, Wet  Prep HPF POC NONE SEEN NONE SEEN   Sperm NONE SEEN    No results found.  Radiology No results found.  Procedures Procedures (including critical care time)  Medications Ordered in ED Medications - No data to display  ED Course  I have reviewed the triage vital signs and the nursing notes.  Pertinent labs & imaging results that were available during my care of the patient were reviewed by me and considered in my medical decision making (see chart for details).    Well appearing.  Vitals and exam and normal.  No indication for labs or imaging at this time.  No yeast on self swab.  Stable for discharge.  Placed in home quarantine pending test result. Strict return precautions given.    RIAH KEHOE was evaluated in Emergency Department on 12/04/2020 for the symptoms described in the history of present illness. She was evaluated in the context of the global COVID-19 pandemic, which necessitated consideration that the patient might be at risk for infection with the SARS-CoV-2 virus that causes COVID-19. Institutional protocols and algorithms that pertain to the evaluation of patients at risk for COVID-19 are in a state of rapid change based on information released by regulatory bodies including the CDC and federal and state organizations. These policies and algorithms were followed during the patient's care in the ED.  Final Clinical Impression(s) / ED Diagnoses Return for intractable cough, coughing up blood, fevers > 100.4 unrelieved by medication, shortness of breath, intractable vomiting, chest pain, shortness of breath, weakness, numbness, changes in speech, facial asymmetry, abdominal pain, passing out, Inability to  tolerate liquids or food, cough, altered mental status or any concerns. No signs of systemic illness or infection. The patient is nontoxic-appearing on exam and vital signs are within normal limits.  I have reviewed the triage vital signs and the nursing notes. Pertinent labs & imaging results that were available during my care of the patient were reviewed by me and considered in my medical decision making (see chart for details). After history, exam, and medical workup I feel the patient has been appropriately medically screened and is safe for discharge home. Pertinent diagnoses were discussed with the patient. Patient was given return precautions.     Palumbo, April, MD 12/04/20 Ivin Poot, MD 12/04/20 [id number]

## 2020-12-04 NOTE — Discharge Instructions (Addendum)
Person Under Monitoring Name: Grace Daniels  Location: [address] [place] [address]   Infection Prevention Recommendations for Individuals Confirmed to have, or Being Evaluated for, 2019 Novel Coronavirus (COVID-19) Infection Who Receive Care at Home  Individuals who are confirmed to have, or are being evaluated for, COVID-19 should follow the prevention steps below until a healthcare provider or local or state health department says they can return to normal activities.  Stay home except to get medical care You should restrict activities outside your home, except for getting medical care. Do not go to work, school, or public areas, and do not use public transportation or taxis.  Call ahead before visiting your doctor Before your medical appointment, call the healthcare provider and tell them that you have, or are being evaluated for, COVID-19 infection. This will help the healthcare providers office take steps to keep other people from getting infected. Ask your healthcare provider to call the local or state health department.  Monitor your symptoms Seek prompt medical attention if your illness is worsening (e.g., difficulty breathing). Before going to your medical appointment, call the healthcare provider and tell them that you have, or are being evaluated for, COVID-19 infection. Ask your healthcare provider to call the local or state health department.  Wear a facemask You should wear a facemask that covers your nose and mouth when you are in the same room with other people and when you visit a healthcare provider. People who live with or visit you should also wear a facemask while they are in the same room with you.  Separate yourself from other people in your home As much as possible, you should stay in a different room from other people in your home. Also, you should use a separate bathroom, if available.  Avoid sharing household items You  should not share dishes, drinking glasses, cups, eating utensils, towels, bedding, or other items with other people in your home. After using these items, you should wash them thoroughly with soap and water.  Cover your coughs and sneezes Cover your mouth and nose with a tissue when you cough or sneeze, or you can cough or sneeze into your sleeve. Throw used tissues in a lined trash can, and immediately wash your hands with soap and water for at least 20 seconds or use an alcohol-based hand rub.  Wash your [employer] your hands often and thoroughly with soap and water for at least 20 seconds. You can use an alcohol-based hand sanitizer if soap and water are not available and if your hands are not visibly dirty. Avoid touching your eyes, nose, and mouth with unwashed hands.   Prevention Steps for Caregivers and Household Members of Individuals Confirmed to have, or Being Evaluated for, COVID-19 Infection Being Cared for in the Home  If you live with, or provide care at home for, a person confirmed to have, or being evaluated for, COVID-19 infection please follow these guidelines to prevent infection:  Follow healthcare providers instructions Make sure that you understand and can help the patient follow any healthcare provider instructions for all care.  Provide for the patients basic needs You should help the patient with basic needs in the home and provide support for getting groceries, prescriptions, and other personal needs.  Monitor the patients symptoms If they are getting sicker, call his or her medical provider and tell them that the patient has, or is being evaluated for, COVID-19 infection. This will help the healthcare  providers office take steps to keep other people from getting infected. Ask the healthcare provider to call the local or state health department.  Limit the number of people who have contact with the patient If possible, have only one caregiver for the  patient. Other household members should stay in another home or place of residence. If this is not possible, they should stay in another room, or be separated from the patient as much as possible. Use a separate bathroom, if available. Restrict visitors who do not have an essential need to be in the home.  Keep older adults, very young children, and other sick people away from the patient Keep older adults, very young children, and those who have compromised immune systems or chronic health conditions away from the patient. This includes people with chronic heart, lung, or kidney conditions, diabetes, and cancer.  Ensure good ventilation Make sure that shared spaces in the home have good air flow, such as from an air conditioner or an opened window, weather permitting.  Wash your hands often Wash your hands often and thoroughly with soap and water for at least 20 seconds. You can use an alcohol based hand sanitizer if soap and water are not available and if your hands are not visibly dirty. Avoid touching your eyes, nose, and mouth with unwashed hands. Use disposable paper towels to dry your hands. If not available, use dedicated cloth towels and replace them when they become wet.  Wear a facemask and gloves Wear a disposable facemask at all times in the room and gloves when you touch or have contact with the patients blood, body fluids, and/or secretions or excretions, such as sweat, saliva, sputum, nasal mucus, vomit, urine, or feces.  Ensure the mask fits over your nose and mouth tightly, and do not touch it during use. Throw out disposable facemasks and gloves after using them. Do not reuse. Wash your hands immediately after removing your facemask and gloves. If your personal clothing becomes contaminated, carefully remove clothing and launder. Wash your hands after handling contaminated clothing. Place all used disposable facemasks, gloves, and other waste in a lined container before  disposing them with other household waste. Remove gloves and wash your hands immediately after handling these items.  Do not share dishes, glasses, or other household items with the patient Avoid sharing household items. You should not share dishes, drinking glasses, cups, eating utensils, towels, bedding, or other items with a patient who is confirmed to have, or being evaluated for, COVID-19 infection. After the person uses these items, you should wash them thoroughly with soap and water.  Wash laundry thoroughly Immediately remove and wash clothes or bedding that have blood, body fluids, and/or secretions or excretions, such as sweat, saliva, sputum, nasal mucus, vomit, urine, or feces, on them. Wear gloves when handling laundry from the patient. Read and follow directions on labels of laundry or clothing items and detergent. In general, wash and dry with the warmest temperatures recommended on the label.  Clean all areas the individual has used often Clean all touchable surfaces, such as counters, tabletops, doorknobs, bathroom fixtures, toilets, phones, keyboards, tablets, and bedside tables, every day. Also, clean any surfaces that may have blood, body fluids, and/or secretions or excretions on them. Wear gloves when cleaning surfaces the patient has come in contact with. Use a diluted bleach solution (e.g., dilute bleach with 1 part bleach and 10 parts water) or a household disinfectant with a label that says EPA-registered for coronaviruses. To make  a bleach solution at home, add 1 tablespoon of bleach to 1 quart (4 cups) of water. For a larger supply, add  cup of bleach to 1 gallon (16 cups) of water. Read labels of cleaning products and follow recommendations provided on product labels. Labels contain instructions for safe and effective use of the cleaning product including precautions you should take when applying the product, such as wearing gloves or eye protection and making sure you  have good ventilation during use of the product. Remove gloves and wash hands immediately after cleaning.  Monitor yourself for signs and symptoms of illness Caregivers and household members are considered close contacts, should monitor their health, and will be asked to limit movement outside of the home to the extent possible. Follow the monitoring steps for close contacts listed on the symptom monitoring form.   ? If you have additional questions, contact your local health department or call the epidemiologist on call at [phone] (available 24/7). ? This guidance is subject to change. For the most up-to-date guidance from [facility], please refer to their website: [email or web address]

## 2020-12-16 ENCOUNTER — Other Ambulatory Visit: Payer: Self-pay | Admitting: Family Medicine

## 2020-12-16 DIAGNOSIS — E119 Type 2 diabetes mellitus without complications: Secondary | ICD-10-CM

## 2020-12-16 DIAGNOSIS — R739 Hyperglycemia, unspecified: Secondary | ICD-10-CM

## 2020-12-16 DIAGNOSIS — R7309 Other abnormal glucose: Secondary | ICD-10-CM

## 2020-12-25 ENCOUNTER — Ambulatory Visit: Payer: 59 | Admitting: Family Medicine

## 2021-01-09 ENCOUNTER — Other Ambulatory Visit: Payer: Self-pay | Admitting: Family Medicine

## 2021-01-09 DIAGNOSIS — R7309 Other abnormal glucose: Secondary | ICD-10-CM

## 2021-01-09 DIAGNOSIS — E119 Type 2 diabetes mellitus without complications: Secondary | ICD-10-CM

## 2021-01-09 DIAGNOSIS — R739 Hyperglycemia, unspecified: Secondary | ICD-10-CM

## 2021-01-09 NOTE — Telephone Encounter (Signed)
COPAY IS $419 please advise

## 2021-01-10 ENCOUNTER — Encounter: Payer: Self-pay | Admitting: Family Medicine

## 2021-01-14 DIAGNOSIS — E559 Vitamin D deficiency, unspecified: Secondary | ICD-10-CM

## 2021-01-14 HISTORY — DX: Vitamin D deficiency, unspecified: E55.9

## 2021-01-21 ENCOUNTER — Other Ambulatory Visit: Payer: Self-pay

## 2021-01-21 ENCOUNTER — Encounter: Payer: Self-pay | Admitting: Family Medicine

## 2021-01-21 ENCOUNTER — Ambulatory Visit (INDEPENDENT_AMBULATORY_CARE_PROVIDER_SITE_OTHER): Payer: Self-pay | Admitting: Family Medicine

## 2021-01-21 VITALS — BP 145/90 | HR 101 | Ht 70.0 in | Wt 357.0 lb

## 2021-01-21 DIAGNOSIS — Z09 Encounter for follow-up examination after completed treatment for conditions other than malignant neoplasm: Secondary | ICD-10-CM

## 2021-01-21 DIAGNOSIS — R634 Abnormal weight loss: Secondary | ICD-10-CM

## 2021-01-21 DIAGNOSIS — R7309 Other abnormal glucose: Secondary | ICD-10-CM

## 2021-01-21 DIAGNOSIS — Z0000 Encounter for general adult medical examination without abnormal findings: Secondary | ICD-10-CM

## 2021-01-21 DIAGNOSIS — E119 Type 2 diabetes mellitus without complications: Secondary | ICD-10-CM

## 2021-01-21 DIAGNOSIS — R739 Hyperglycemia, unspecified: Secondary | ICD-10-CM

## 2021-01-21 LAB — POCT GLYCOSYLATED HEMOGLOBIN (HGB A1C): Hemoglobin A1C: 9 % — AB (ref 4.0–5.6)

## 2021-01-21 MED ORDER — TRULICITY 0.75 MG/0.5ML ~~LOC~~ SOAJ: 0.7500 mg | SUBCUTANEOUS | 6 refills | Status: DC

## 2021-01-21 NOTE — Progress Notes (Signed)
[facility]    Established Patient Office Visit  Subjective:  Patient ID: Grace Daniels, female    DOB: [birth year]  Age: 30 y.o. MRN: [id number]  CC:  Chief Complaint  Patient presents with  . Diabetes    HPI Grace Daniels is a 30 year old female who presents for Follow Up today.    Patient Active Problem List   Diagnosis Date Noted  . Acute pain of right thigh 06/24/2020  . Acute pain of right knee 06/24/2020  . Vaginal itching 08/01/2019  . Vaginal burning 08/01/2019  . Yeast infection 06/07/2019  . Hemoglobin A1C greater than 9%, indicating poor diabetic control 05/14/2019  . Type 2 diabetes mellitus without complication, without long-term current use of insulin (HCC) 03/08/2019  . Elevated glucose 03/08/2019  . Recent weight loss 03/08/2019  . Obstructive sleep apnea 08/02/2017  . Morbid obesity due to excess calories (HCC) 08/02/2017  . HTN (hypertension) 04/26/2017  . Abnormal uterine bleeding (AUB) 03/30/2016   Current Status: Since her last office visit, she is doing well with no complaints. Her most recent normal range of preprandial blood glucose levels have been between 200-220. She has seen low range of 120 and high of 300 since his last office visit. She denies fatigue, frequent urination, blurred vision, excessive hunger, excessive thirst, weight gain, weight loss, and poor wound healing. She continues to check her feet regularly. She has not recently had a Diabetic eye exam. She denies visual changes, chest pain, cough, shortness of breath, heart palpitations, and falls. She has occasional headaches and dizziness with position changes. Denies severe headaches, confusion, seizures, double vision, and blurred vision, nausea and vomiting. She denies fevers, chills, recent infections, weight loss, and night sweats. Denies GI problems such as diarrhea, and constipation. She has no reports of blood in stools, dysuria and  hematuria. No depression or anxiety reported today. She is taking all medications as prescribed. She denies pain today.   Past Medical History:  Diagnosis Date  . Coronavirus infection 11/2020  . Diabetes (HCC)   . Hyperlipidemia   . Hypertension   . Morbid obesity (HCC)   . Right knee pain 05/2020  . Right thigh pain 05/2020  . Sleep apnea     No past surgical history on file.  No family history on file.  Social History   Socioeconomic History  . Marital status: Single    Spouse name: Not on file  . Number of children: Not on file  . Years of education: Not on file  . Highest education level: Not on file  Occupational History  . Not on file  Tobacco Use  . Smoking status: Never Smoker  . Smokeless tobacco: Never Used  Vaping Use  . Vaping Use: Never used  Substance and Sexual Activity  . Alcohol use: No  . Drug use: No  . Sexual activity: Not Currently  Other Topics Concern  . Not on file  Social History Narrative  . Not on file   Social Determinants of Health   Financial Resource Strain: Not on file  Food Insecurity: Not on file  Transportation Needs: Not on file  Physical Activity: Not on file  Stress: Not on file  Social Connections: Not on file  Intimate Partner Violence: Not on file    Outpatient Medications Prior to Visit  Medication Sig Dispense Refill  . albuterol (VENTOLIN HFA) 108 (90 Base) MCG/ACT inhaler Inhale 1-2 puffs into the lungs  every 6 (six) hours as needed for wheezing or shortness of breath. 1 each 0  . atorvastatin (LIPITOR) 10 MG tablet Take 1 tablet (10 mg total) by mouth daily. 90 tablet 3  . blood glucose meter kit and supplies KIT Dispense based on patient and insurance preference. Use up to four times daily as directed. (FOR ICD-9 250.00, 250.01). 1 each 0  . Blood Glucose Monitoring Suppl (ACURA BLOOD GLUCOSE METER) w/Device KIT 1 applicator by Does not apply route 4 (four) times daily -  before meals and at bedtime. 1 kit 1  .  glipiZIDE (GLUCOTROL) 10 MG tablet Take 1 tablet (10 mg total) by mouth 2 (two) times daily. 180 tablet 3  . glucose blood test strip Use as instructed 300 each 3  . hydrOXYzine (ATARAX/VISTARIL) 10 MG tablet Take 1 tablet (10 mg total) by mouth 3 (three) times daily as needed for itching. 90 tablet 2  . metFORMIN (GLUCOPHAGE) 1000 MG tablet Take 1 tablet (1,000 mg total) by mouth 2 (two) times daily with a meal. 180 tablet 3  . triamterene-hydrochlorothiazide (DYAZIDE) 37.5-25 MG capsule TAKE 1 EACH (1 CAPSULE TOTAL) BY MOUTH DAILY. 90 capsule 3  . JANUVIA 100 MG tablet TAKE 1 TABLET BY MOUTH EVERY DAY 90 tablet 0   No facility-administered medications prior to visit.    No Known Allergies  ROS Review of Systems  Constitutional: Negative.   HENT: Negative.   Eyes: Negative.   Respiratory: Negative.   Cardiovascular: Negative.   Gastrointestinal: Positive for abdominal distention (obese).  Endocrine: Negative.   Genitourinary: Negative.   Musculoskeletal: Positive for arthralgias (occassional).  Skin: Negative.   Allergic/Immunologic: Negative.   Neurological: Positive for dizziness (occasional ) and headaches (occasional).  Hematological: Negative.   Psychiatric/Behavioral: Negative.       Objective:    Physical Exam Vitals and nursing note reviewed.  Constitutional:      Appearance: Normal appearance.  HENT:     Head: Normocephalic and atraumatic.  Cardiovascular:     Rate and Rhythm: Normal rate and regular rhythm.     Pulses: Normal pulses.     Heart sounds: Normal heart sounds.  Pulmonary:     Effort: Pulmonary effort is normal.     Breath sounds: Normal breath sounds.  Musculoskeletal:     Cervical back: Normal range of motion and neck supple.  Neurological:     Mental Status: She is alert.     BP (!) 145/90 Comment: without medication  Pulse (!) 101   Ht $R'5\' 10"'vz$  (1.778 m)   Wt (!) 357 lb (161.9 kg)   SpO2 99%   BMI 51.22 kg/m  Wt Readings from Last 3  Encounters:  01/21/21 (!) 357 lb (161.9 kg)  12/04/20 (!) 358 lb 14.5 oz (162.8 kg)  10/23/20 (!) 359 lb (162.8 kg)     Health Maintenance Due  Topic Date Due  . PNEUMOCOCCAL POLYSACCHARIDE VACCINE AGE 97-64 HIGH RISK  Never done  . COVID-19 Vaccine (1) Never done  . OPHTHALMOLOGY EXAM  Never done  . TETANUS/TDAP  Never done  . FOOT EXAM  11/01/2018  . INFLUENZA VACCINE  Never done    There are no preventive care reminders to display for this patient.  Lab Results  Component Value Date   TSH 2.050 05/02/2018   Lab Results  Component Value Date   WBC 9.7 04/27/2020   HGB 11.3 (L) 04/27/2020   HCT 35.9 (L) 04/27/2020   MCV 64.2 (L) 04/27/2020  PLT 425 (H) 04/27/2020   Lab Results  Component Value Date   NA 137 04/27/2020   K 4.1 04/27/2020   CO2 23 04/27/2020   GLUCOSE 365 (H) 04/27/2020   BUN 14 04/27/2020   CREATININE 0.81 04/27/2020   BILITOT <0.2 10/06/2019   ALKPHOS 75 10/06/2019   AST 13 10/06/2019   ALT 13 10/06/2019   PROT 8.5 10/06/2019   ALBUMIN 4.5 10/06/2019   CALCIUM 9.2 04/27/2020   ANIONGAP 11 04/27/2020   Lab Results  Component Value Date   CHOL 141 05/02/2018   Lab Results  Component Value Date   HDL 24 (L) 05/02/2018   Lab Results  Component Value Date   LDLCALC 55 05/02/2018   Lab Results  Component Value Date   TRIG 312 (H) 05/02/2018   Lab Results  Component Value Date   CHOLHDL 5.9 (H) 05/02/2018   Lab Results  Component Value Date   HGBA1C 9.0 (A) 01/21/2021      Assessment & Plan:   1. Type 2 diabetes mellitus without complication, without long-term current use of insulin (HCC) She will continue medication as prescribed, to decrease foods/beverages high in sugars and carbs and follow Heart Healthy or DASH diet. Increase physical activity to at least 30 minutes cardio exercise daily. - POCT glycosylated hemoglobin (Hb A1C) - Dulaglutide (TRULICITY) 5.95 GL/8.7FI SOPN; Inject 0.75 mg into the skin once a week.   Dispense: 0.5 mL; Refill: 6  2. Hemoglobin A1C greater than 9%, indicating poor diabetic control Hgb A1c at 9.0 today, from 10.5 on 12/8.2022. Monitor.   3. Hyperglycemia  4. Healthcare maintenance - TSH - Vitamin B12 - Vitamin D, 25-hydroxy - Lipid Panel - CBC with Differential - Comprehensive metabolic panel  5. Weight loss Approximately 20 lb weight loss in 8 months.  6. Follow up She will follow up in 6 months.  Meds ordered this encounter  Medications  . Dulaglutide (TRULICITY) 4.33 IR/5.1OA SOPN    Sig: Inject 0.75 mg into the skin once a week.    Dispense:  0.5 mL    Refill:  6    Orders Placed This Encounter  Procedures  . TSH  . Vitamin B12  . Vitamin D, 25-hydroxy  . Lipid Panel  . CBC with Differential  . Comprehensive metabolic panel  . POCT glycosylated hemoglobin (Hb A1C)    Referral Orders  No referral(s) requested today    Kathe Becton, MSN, ANE, FNP-BC [address], [place] [address] [phone]- fax    Problem List Items Addressed This Visit      Endocrine   Hemoglobin A1C greater than 9%, indicating poor diabetic control   Relevant Medications   Dulaglutide (TRULICITY) 5.42 HC/6.2BJ SOPN   Type 2 diabetes mellitus without complication, without long-term current use of insulin (HCC) - Primary   Relevant Medications   Dulaglutide (TRULICITY) 6.28 BT/5.1VO SOPN   Other Relevant Orders   POCT glycosylated hemoglobin (Hb A1C) (Completed)    Other Visit Diagnoses    Hyperglycemia       Healthcare maintenance       Relevant Orders   TSH   Vitamin B12   Vitamin D, 25-hydroxy   Lipid Panel   CBC with Differential   Comprehensive metabolic panel   Weight loss       Follow up          Meds ordered this encounter  Medications  . Dulaglutide (TRULICITY) 6.28 ZM/6.2HU SOPN    Sig: Inject 0.75 mg into the  skin once a week.    Dispense:  0.5 mL    Refill:  6    Follow-up: No follow-ups on file.    Azzie Glatter, FNP

## 2021-01-21 NOTE — Patient Instructions (Signed)
Dulaglutide Injection What is this medicine? DULAGLUTIDE (DOO la GLOO tide) controls blood sugar in people with type 2 diabetes. It is used with lifestyle changes like diet and exercise. It may lower the risk of problems that need treatment in the hospital. These problems include heart attack or stroke. This medicine may be used for other purposes; ask your health care provider or pharmacist if you have questions. COMMON BRAND NAME(S): Trulicity What should I tell my health care provider before I take this medicine? They need to know if you have any of these conditions:  endocrine tumors (MEN 2) or if someone in your family had these tumors  eye disease, vision problems  history of pancreatitis  kidney disease  liver disease  stomach or intestine problems  thyroid cancer or if someone in your family had thyroid cancer  an unusual or allergic reaction to dulaglutide, other medicines, foods, dyes, or preservatives  pregnant or trying to get pregnant  breast-feeding How should I use this medicine? This medicine is injected under the skin. You will be taught how to prepare and give it. Take it as directed on the prescription label on the same day of each week. Do NOT prime the pen. Keep taking it unless your health care provider tells you to stop. If you use this medicine with insulin, you should inject this medicine and the insulin separately. Do not mix them together. Do not give the injections right next to each other. Change (rotate) injection sites with each injection. This drug comes with INSTRUCTIONS FOR USE. Ask your pharmacist for directions on how to use this medicine. Read the information carefully. Talk to your pharmacist or health care provider if you have questions. It is important that you put your used needles and syringes in a special sharps container. Do not put them in a trash can. If you do not have a sharps container, call your pharmacist or health care provider to get  one. A special MedGuide will be given to you by the pharmacist with each prescription and refill. Be sure to read this information carefully each time. Talk to your health care provider about the use of this medicine in children. Special care may be needed. Overdosage: If you think you have taken too much of this medicine contact a poison control center or emergency room at once. NOTE: This medicine is only for you. Do not share this medicine with others. What if I miss a dose? If you miss a dose, take it as soon as you can unless it is more than 3 days late. If it is more than 3 days late, skip the missed dose. Take the next dose at the normal time. What may interact with this medicine?  other medicines for diabetes Many medications may cause changes in blood sugar, these include:  alcohol containing beverages  antiviral medicines for HIV or AIDS  aspirin and aspirin-like drugs  certain medicines for blood pressure, heart disease, irregular heart beat  chromium  diuretics  female hormones, such as estrogens or progestins, birth control pills  fenofibrate  gemfibrozil  isoniazid  lanreotide  female hormones or anabolic steroids  MAOIs like Carbex, Eldepryl, Marplan, Nardil, and Parnate  medicines for allergies, asthma, cold, or cough  medicines for depression, anxiety, or psychotic disturbances  medicines for weight loss  niacin  nicotine  NSAIDs, medicines for pain and inflammation, like ibuprofen or naproxen  octreotide  pasireotide  pentamidine  phenytoin  probenecid  quinolone antibiotics such as ciprofloxacin,   levofloxacin, ofloxacin  some herbal dietary supplements  steroid medicines such as prednisone or cortisone  sulfamethoxazole; trimethoprim  thyroid hormones Some medications can hide the warning symptoms of low blood sugar (hypoglycemia). You may need to monitor your blood sugar more closely if you are taking one of these medications.  These include:  beta-blockers, often used for high blood pressure or heart problems (examples include atenolol, metoprolol, propranolol)  clonidine  guanethidine  reserpine This list may not describe all possible interactions. Give your health care provider a list of all the medicines, herbs, non-prescription drugs, or dietary supplements you use. Also tell them if you smoke, drink alcohol, or use illegal drugs. Some items may interact with your medicine. What should I watch for while using this medicine? Visit your health care provider for regular checks on your progress. Check with your health care provider if you have severe diarrhea, nausea, and vomiting, or if you sweat a lot. The loss of too much body fluid may make it dangerous for you to take this medicine. A test called the HbA1C (A1C) will be monitored. This is a simple blood test. It measures your blood sugar control over the last 2 to 3 months. You will receive this test every 3 to 6 months. Learn how to check your blood sugar. Learn the symptoms of low and high blood sugar and how to manage them. Always carry a quick-source of sugar with you in case you have symptoms of low blood sugar. Examples include hard sugar candy or glucose tablets. Make sure others know that you can choke if you eat or drink when you develop serious symptoms of low blood sugar, such as seizures or unconsciousness. Get medical help at once. Tell your health care provider if you have high blood sugar. You might need to change the dose of your medicine. If you are sick or exercising more than usual, you may need to change the dose of your medicine. Do not skip meals. Ask your health care provider if you should avoid alcohol. Many nonprescription cough and cold products contain sugar or alcohol. These can affect blood sugar. Pens should never be shared. Even if the needle is changed, sharing may result in passing of viruses like hepatitis or HIV. Wear a medical ID  bracelet or chain. Carry a card that describes your condition. List the medicines and doses you take on the card. What side effects may I notice from receiving this medicine? Side effects that you should report to your doctor or health care professional as soon as possible:  allergic reactions (skin rash, itching or hives; swelling of the face, lips, or tongue)  changes in vision  diarrhea that continues or is severe  infection (fever, chills, cough, sore throat, pain or trouble passing urine)  kidney injury (trouble passing urine or change in the amount of urine)  low blood sugar (feeling anxious; confusion; dizziness; increased hunger; unusually weak or tired; increased sweating; shakiness; cold, clammy skin; irritable; headache; blurred vision; fast heartbeat; loss of consciousness)  lump or swelling on the neck  trouble breathing  trouble swallowing  unusual stomach upset or pain  vomiting Side effects that usually do not require medical attention (report to your doctor or health care professional if they continue or are bothersome):  lack or loss of appetite  nausea  pain, redness, or irritation at site where injected This list may not describe all possible side effects. Call your doctor for medical advice about side effects. You may report   side effects to FDA at 1-800-FDA-1088. Where should I keep my medicine? Keep out of the reach of children and pets. Refrigeration (preferred): Store unopened pens in a refrigerator between 2 and 8 degrees C (36 and 46 degrees F). Keep it in the original carton until you are ready to take it. Do not freeze or use if the medicine has been frozen. Protect from light. Get rid of any unused medicine after the expiration date on the label. Room Temperature: The pen may be stored at room temperature below 30 degrees C (86 degrees F) for up to a total of 14 days if needed. Protect from light. Avoid exposure to extreme heat. If it is stored at room  temperature, throw away any unused medicine after 14 days or after it expires, whichever is first. To get rid of medicines that are no longer needed or have expired:  Take the medicine to a medicine take-back program. Check with your pharmacy or law enforcement to find a location.  If you cannot return the medicine, ask your pharmacist or health care provider how to get rid of this medicine safely. NOTE: This sheet is a summary. It may not cover all possible information. If you have questions about this medicine, talk to your doctor, pharmacist, or health care provider.  2021 Elsevier/Gold Standard (2020-09-02 07:35:51)  

## 2021-01-22 ENCOUNTER — Encounter: Payer: Self-pay | Admitting: Family Medicine

## 2021-01-22 ENCOUNTER — Other Ambulatory Visit: Payer: Self-pay | Admitting: Family Medicine

## 2021-01-22 DIAGNOSIS — E559 Vitamin D deficiency, unspecified: Secondary | ICD-10-CM

## 2021-01-22 LAB — CBC WITH DIFFERENTIAL/PLATELET
Basophils Absolute: 0.1 10*3/uL (ref 0.0–0.2)
Basos: 1 %
EOS (ABSOLUTE): 0.1 10*3/uL (ref 0.0–0.4)
Eos: 1 %
Hematocrit: 39.8 % (ref 34.0–46.6)
Hemoglobin: 11.9 g/dL (ref 11.1–15.9)
Immature Grans (Abs): 0 10*3/uL (ref 0.0–0.1)
Immature Granulocytes: 0 %
Lymphocytes Absolute: 4.4 10*3/uL — ABNORMAL HIGH (ref 0.7–3.1)
Lymphs: 45 %
MCH: 20.6 pg — ABNORMAL LOW (ref 26.6–33.0)
MCHC: 29.9 g/dL — ABNORMAL LOW (ref 31.5–35.7)
MCV: 69 fL — ABNORMAL LOW (ref 79–97)
Monocytes Absolute: 0.7 10*3/uL (ref 0.1–0.9)
Monocytes: 7 %
Neutrophils Absolute: 4.6 10*3/uL (ref 1.4–7.0)
Neutrophils: 46 %
Platelets: 506 10*3/uL — ABNORMAL HIGH (ref 150–450)
RBC: 5.78 x10E6/uL — ABNORMAL HIGH (ref 3.77–5.28)
RDW: 18.9 % — ABNORMAL HIGH (ref 11.7–15.4)
WBC: 9.8 10*3/uL (ref 3.4–10.8)

## 2021-01-22 LAB — LIPID PANEL
Chol/HDL Ratio: 4.2 ratio (ref 0.0–4.4)
Cholesterol, Total: 154 mg/dL (ref 100–199)
HDL: 37 mg/dL — ABNORMAL LOW (ref >39)
LDL Chol Calc (NIH): 86 mg/dL (ref 0–99)
Triglycerides: 180 mg/dL — ABNORMAL HIGH (ref 0–149)
VLDL Cholesterol Cal: 31 mg/dL (ref 5–40)

## 2021-01-22 LAB — COMPREHENSIVE METABOLIC PANEL
ALT: 9 IU/L (ref 0–32)
AST: 8 IU/L (ref 0–40)
Albumin/Globulin Ratio: 1 — ABNORMAL LOW (ref 1.2–2.2)
Albumin: 4 g/dL (ref 3.9–5.0)
Alkaline Phosphatase: 83 IU/L (ref 44–121)
BUN/Creatinine Ratio: 15 (ref 9–23)
BUN: 10 mg/dL (ref 6–20)
Bilirubin Total: <0.2 mg/dL (ref 0.0–1.2)
CO2: 19 mmol/L — ABNORMAL LOW (ref 20–29)
Calcium: 9.7 mg/dL (ref 8.7–10.2)
Chloride: 102 mmol/L (ref 96–106)
Creatinine, Ser: 0.67 mg/dL (ref 0.57–1.00)
Globulin, Total: 4 g/dL (ref 1.5–4.5)
Glucose: 183 mg/dL — ABNORMAL HIGH (ref 65–99)
Potassium: 4.3 mmol/L (ref 3.5–5.2)
Sodium: 138 mmol/L (ref 134–144)
Total Protein: 8 g/dL (ref 6.0–8.5)
eGFR: 121 mL/min/{1.73_m2} (ref >59)

## 2021-01-22 LAB — VITAMIN D 25 HYDROXY (VIT D DEFICIENCY, FRACTURES): Vit D, 25-Hydroxy: 5.4 ng/mL — ABNORMAL LOW (ref 30.0–100.0)

## 2021-01-22 LAB — TSH: TSH: 2.34 u[IU]/mL (ref 0.450–4.500)

## 2021-01-22 LAB — VITAMIN B12: Vitamin B-12: 307 pg/mL (ref 232–1245)

## 2021-01-22 MED ORDER — VITAMIN D (ERGOCALCIFEROL) 1.25 MG (50000 UNIT) PO CAPS: 50000.0000 [IU] | ORAL_CAPSULE | ORAL | 6 refills | Status: DC

## 2021-01-24 ENCOUNTER — Telehealth: Payer: Self-pay

## 2021-01-24 NOTE — Telephone Encounter (Signed)
-----   Message from Kallie Locks, FNP sent at 01/22/2021  1:46 PM EST ----- Vitamin D level is extremely low. Rx for Vitamin D supplement sent to pharmacy today. She will take medication once weekly as prescribed. She should include foods that are high in Vitamin D. These include: Salmon, Cod Liver Oil, Mushrooms, Canned Fish, Milk, and Egg Yolks.   All other labs are stable. Keep follow up appointment. Please inform patient. Thank you.

## 2021-01-24 NOTE — Telephone Encounter (Signed)
Mailbox is full and cannot accept any messages at this time. Copy of labs and recommendations mailed to patient.

## 2021-01-24 NOTE — Progress Notes (Signed)
Copy of labs and recommendations mailed to patient.

## 2021-01-28 ENCOUNTER — Other Ambulatory Visit: Payer: Self-pay | Admitting: Family Medicine

## 2021-01-28 ENCOUNTER — Telehealth: Payer: Self-pay

## 2021-01-28 DIAGNOSIS — B379 Candidiasis, unspecified: Secondary | ICD-10-CM

## 2021-01-28 MED ORDER — FLUCONAZOLE 150 MG PO TABS: ORAL_TABLET | ORAL | 0 refills | Status: DC

## 2021-01-28 NOTE — Telephone Encounter (Signed)
Patient thinks she has a yeast infection and would like something sent in.

## 2021-03-17 ENCOUNTER — Other Ambulatory Visit: Payer: Self-pay

## 2021-03-17 ENCOUNTER — Telehealth: Payer: Self-pay

## 2021-03-17 DIAGNOSIS — B379 Candidiasis, unspecified: Secondary | ICD-10-CM

## 2021-03-17 MED ORDER — FLUCONAZOLE 150 MG PO TABS: ORAL_TABLET | ORAL | 0 refills | Status: DC

## 2021-03-17 NOTE — Telephone Encounter (Signed)
Called and spoke with patient pt is coming in 03/18/21 to do self swab.  Pt is having itching and burning , possible yeast infection

## 2021-03-17 NOTE — Telephone Encounter (Signed)
Medication for yeast infection

## 2021-03-18 ENCOUNTER — Other Ambulatory Visit (INDEPENDENT_AMBULATORY_CARE_PROVIDER_SITE_OTHER): Payer: Self-pay

## 2021-03-18 ENCOUNTER — Other Ambulatory Visit: Payer: Self-pay

## 2021-03-18 DIAGNOSIS — R82998 Other abnormal findings in urine: Secondary | ICD-10-CM

## 2021-03-18 DIAGNOSIS — E119 Type 2 diabetes mellitus without complications: Secondary | ICD-10-CM

## 2021-03-18 DIAGNOSIS — N898 Other specified noninflammatory disorders of vagina: Secondary | ICD-10-CM

## 2021-03-18 LAB — POCT URINALYSIS DIPSTICK
Bilirubin, UA: NEGATIVE
Blood, UA: NEGATIVE
Glucose, UA: NEGATIVE
Nitrite, UA: NEGATIVE
Protein, UA: NEGATIVE
Spec Grav, UA: >=1.03 — AB (ref 1.010–1.025)
Urobilinogen, UA: 1 E.U./dL
pH, UA: 6 (ref 5.0–8.0)

## 2021-03-19 ENCOUNTER — Other Ambulatory Visit: Payer: Self-pay

## 2021-03-19 DIAGNOSIS — B379 Candidiasis, unspecified: Secondary | ICD-10-CM

## 2021-03-19 MED ORDER — FLUCONAZOLE 150 MG PO TABS: ORAL_TABLET | ORAL | 0 refills | Status: DC

## 2021-03-19 NOTE — Telephone Encounter (Signed)
Mallery said someone was going to call her today. She have not heard from anybody and she need something by 6am because she going out of town in the morning.

## 2021-03-19 NOTE — Telephone Encounter (Signed)
Called and spoke with to inform her that we have sent medication for yeast infection, however she will need come sooner for an appt for her diabetes  made an appt come in June to follow up with crystal.

## 2021-03-20 LAB — URINE CULTURE

## 2021-03-21 LAB — NUSWAB VAGINITIS PLUS (VG+)
Atopobium vaginae: HIGH Score — AB
BVAB 2: HIGH Score — AB
Candida albicans, NAA: NEGATIVE
Candida glabrata, NAA: NEGATIVE
Chlamydia trachomatis, NAA: NEGATIVE
Neisseria gonorrhoeae, NAA: NEGATIVE
Trich vag by NAA: NEGATIVE

## 2021-03-28 ENCOUNTER — Ambulatory Visit (INDEPENDENT_AMBULATORY_CARE_PROVIDER_SITE_OTHER): Payer: BLUE CROSS/BLUE SHIELD | Admitting: Podiatry

## 2021-03-28 ENCOUNTER — Other Ambulatory Visit: Payer: Self-pay

## 2021-03-28 ENCOUNTER — Encounter: Payer: Self-pay | Admitting: Podiatry

## 2021-03-28 ENCOUNTER — Ambulatory Visit (INDEPENDENT_AMBULATORY_CARE_PROVIDER_SITE_OTHER): Payer: BLUE CROSS/BLUE SHIELD

## 2021-03-28 DIAGNOSIS — M79671 Pain in right foot: Secondary | ICD-10-CM

## 2021-03-28 DIAGNOSIS — M722 Plantar fascial fibromatosis: Secondary | ICD-10-CM

## 2021-03-28 DIAGNOSIS — M79672 Pain in left foot: Secondary | ICD-10-CM

## 2021-03-28 MED ORDER — TRIAMCINOLONE ACETONIDE 10 MG/ML IJ SUSP
10.0000 mg | Freq: Once | INTRAMUSCULAR | Status: AC
  Administered 2021-03-28: 10 mg

## 2021-03-28 MED ORDER — DICLOFENAC SODIUM 75 MG PO TBEC: 75.0000 mg | DELAYED_RELEASE_TABLET | Freq: Two times a day (BID) | ORAL | 2 refills | Status: DC

## 2021-03-28 NOTE — Patient Instructions (Signed)

## 2021-03-30 NOTE — Progress Notes (Signed)
Subjective:   Patient ID: Grace Daniels, female   DOB: 30 y.o.   MRN: [id number]   HPI Patient states she is having a lot of pain in her right arch and she is obese and does work a weightbearing job.  States it sore when she tries to stand up or tries to walk on it and its been this way for several months.  Patient does not remember injury and does not smoke and would like to be more active   Review of Systems  All other systems reviewed and are negative.       Objective:  Physical Exam Vitals and nursing note reviewed.  Constitutional:      Appearance: She is well-developed.  Pulmonary:     Effort: Pulmonary effort is normal.  Musculoskeletal:        General: Normal range of motion.  Skin:    General: Skin is warm.  Neurological:     Mental Status: She is alert.     Neurovascular status intact muscle strength adequate range of motion adequate with discomfort in the mid arch area right inflammation fluid buildup with a tight tendon moderate equinus and reduced range of motion subtalar joint.  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  At this point I did do a mid arch injection right 3 mg dexamethasone Kenalog 5 mg Xylocaine advised on fascial brace and start on oral anti-inflammatory and also shoe gear modifications.  Instructed on physical therapy and reappoint to recheck  X-rays indicate significant reduction of the arch height with no indications of arthritis or fracture

## 2021-04-16 ENCOUNTER — Ambulatory Visit: Payer: BLUE CROSS/BLUE SHIELD | Admitting: Podiatry

## 2021-04-25 ENCOUNTER — Other Ambulatory Visit: Payer: Self-pay

## 2021-04-25 ENCOUNTER — Ambulatory Visit (INDEPENDENT_AMBULATORY_CARE_PROVIDER_SITE_OTHER): Payer: Self-pay | Admitting: Nurse Practitioner

## 2021-04-25 ENCOUNTER — Encounter: Payer: Self-pay | Admitting: Nurse Practitioner

## 2021-04-25 VITALS — BP 132/84 | HR 93 | Temp 97.9°F | Ht 70.0 in | Wt 351.0 lb

## 2021-04-25 DIAGNOSIS — I10 Essential (primary) hypertension: Secondary | ICD-10-CM

## 2021-04-25 DIAGNOSIS — E119 Type 2 diabetes mellitus without complications: Secondary | ICD-10-CM

## 2021-04-25 DIAGNOSIS — L299 Pruritus, unspecified: Secondary | ICD-10-CM

## 2021-04-25 LAB — POCT GLYCOSYLATED HEMOGLOBIN (HGB A1C)
HbA1c POC (<> result, manual entry): 10.3 % (ref 4.0–5.6)
HbA1c, POC (controlled diabetic range): 10.3 % — AB (ref 0.0–7.0)
HbA1c, POC (prediabetic range): 10.3 % — AB (ref 5.7–6.4)
Hemoglobin A1C: 10.3 % — AB (ref 4.0–5.6)

## 2021-04-25 LAB — POCT URINALYSIS DIPSTICK
Bilirubin, UA: NEGATIVE
Glucose, UA: NEGATIVE
Ketones, UA: NEGATIVE
Nitrite, UA: NEGATIVE
Protein, UA: POSITIVE — AB
Spec Grav, UA: >=1.03 — AB (ref 1.010–1.025)
Urobilinogen, UA: 0.2 E.U./dL
pH, UA: 5.5 (ref 5.0–8.0)

## 2021-04-25 LAB — GLUCOSE, POCT (MANUAL RESULT ENTRY): POC Glucose: 202 mg/dl — AB (ref 70–99)

## 2021-04-25 MED ORDER — FLUCONAZOLE 150 MG PO TABS: 150.0000 mg | ORAL_TABLET | Freq: Once | ORAL | 0 refills | Status: AC

## 2021-04-25 MED ORDER — SITAGLIPTIN PHOSPHATE 50 MG PO TABS
50.0000 mg | ORAL_TABLET | Freq: Every day | ORAL | 11 refills | Status: DC
  Filled 2021-04-25: qty 53, 30d supply, fill #0

## 2021-04-25 MED ORDER — BLOOD PRESSURE MONITOR AUTOMAT DEVI
1.0000 | Freq: Every day | 0 refills | Status: DC
  Filled 2021-04-25: qty 1, fill #0

## 2021-04-25 MED ORDER — GLUCOSE BLOOD VI STRP: ORAL_STRIP | 12 refills | Status: DC

## 2021-04-25 MED ORDER — ATORVASTATIN CALCIUM 10 MG PO TABS: 10.0000 mg | ORAL_TABLET | Freq: Every day | ORAL | 3 refills | Status: DC

## 2021-04-25 MED ORDER — HYDROXYZINE HCL 10 MG PO TABS: 10.0000 mg | ORAL_TABLET | Freq: Three times a day (TID) | ORAL | 2 refills | Status: DC | PRN

## 2021-04-25 MED ORDER — METFORMIN HCL 1000 MG PO TABS: 1000.0000 mg | ORAL_TABLET | Freq: Two times a day (BID) | ORAL | 3 refills | Status: DC

## 2021-04-25 MED ORDER — TRIAMTERENE-HCTZ 37.5-25 MG PO CAPS: ORAL_CAPSULE | ORAL | 3 refills | Status: DC

## 2021-04-25 MED ORDER — SITAGLIPTIN PHOSPHATE 50 MG PO TABS: 50.0000 mg | ORAL_TABLET | Freq: Every day | ORAL | 11 refills | Status: DC

## 2021-04-25 MED ORDER — GLIPIZIDE 10 MG PO TABS: 10.0000 mg | ORAL_TABLET | Freq: Two times a day (BID) | ORAL | 3 refills | Status: DC

## 2021-04-25 NOTE — Progress Notes (Signed)
[place], [place]  [address] Phone:  [phone]   Fax:  [phone]   Established Patient Office Visit  Subjective:  Patient ID: Grace Daniels, female    DOB: 1991-01-02  Age: 30 y.o. MRN: [id number]  CC:  Chief Complaint  Patient presents with   Follow-up    Feels like she has a yeast infection again, swab given. Right foot pain, saw podiatry was given Volteran not much relief.     HPI Grace Daniels presents for follow up. A former patient of NP Stroud. She  has a past medical history of Coronavirus infection (11/2020), Diabetes ([place]), Hyperlipidemia, Hypertension, Morbid obesity ([place]), Right knee pain (05/2020), Right thigh pain (05/2020), Sleep apnea, and Vitamin D deficiency (01/2021).   Diabetes Mellitus Patient presents for follow up of diabetes. Current symptoms include: hyperglycemia. And recurrent yeast infections symptoms have gradually worsened. Patient denies foot ulcerations, hypoglycemia , nausea, and vomiting. Evaluation to date has included: fasting blood sugar, fasting lipid panel, and hemoglobin A1C.  Home sugars: patient does not check sugars. Current treatment: more intensive attention to diet which has been not very effective, Continued sulfonylurea which has been not very effective, Continued metformin which has been not very effective, Continued statin which has been not very effective, and Continued ACE inhibitor/ARB which has been somewhat effective. Last dilated eye exam: pending    Past Medical History:  Diagnosis Date   Coronavirus infection 11/2020   Diabetes ([place])    Hyperlipidemia    Hypertension    Morbid obesity ([place])    Right knee pain 05/2020   Right thigh pain 05/2020   Sleep apnea    Vitamin D deficiency 01/2021    No past surgical history on file.  No family history on file.  Social History   Socioeconomic History   Marital status: Single    Spouse name: Not on file   Number of  children: Not on file   Years of education: Not on file   Highest education level: Not on file  Occupational History   Not on file  Tobacco Use   Smoking status: Never   Smokeless tobacco: Never  Vaping Use   Vaping Use: Never used  Substance and Sexual Activity   Alcohol use: No   Drug use: No   Sexual activity: Not Currently  Other Topics Concern   Not on file  Social History Narrative   Not on file   Social Determinants of Health   Financial Resource Strain: Not on file  Food Insecurity: Not on file  Transportation Needs: Not on file  Physical Activity: Not on file  Stress: Not on file  Social Connections: Not on file  Intimate Partner Violence: Not on file    Outpatient Medications Prior to Visit  Medication Sig Dispense Refill   albuterol (VENTOLIN HFA) 108 (90 Base) MCG/ACT inhaler Inhale 1-2 puffs into the lungs every 6 (six) hours as needed for wheezing or shortness of breath. 1 each 0   blood glucose meter kit and supplies KIT Dispense based on patient and insurance preference. Use up to four times daily as directed. (FOR ICD-9 250.00, 250.01). 1 each 0   Blood Glucose Monitoring Suppl (ACURA BLOOD GLUCOSE METER) w/Device KIT 1 applicator by Does not apply route 4 (four) times daily -  before meals and at bedtime. 1 kit 1   diclofenac (VOLTAREN) 75 MG EC tablet Take 1 tablet (75 mg total) by mouth  2 (two) times daily. 50 tablet 2   Vitamin D, Ergocalciferol, (DRISDOL) 1.25 MG (50000 UNIT) CAPS capsule Take 1 capsule (50,000 Units total) by mouth every 7 (seven) days. 5 capsule 6   atorvastatin (LIPITOR) 10 MG tablet Take 1 tablet (10 mg total) by mouth daily. 90 tablet 3   glipiZIDE (GLUCOTROL) 10 MG tablet Take 1 tablet (10 mg total) by mouth 2 (two) times daily. 180 tablet 3   glucose blood test strip Use as instructed 300 each 3   hydrOXYzine (ATARAX/VISTARIL) 10 MG tablet Take 1 tablet (10 mg total) by mouth 3 (three) times daily as needed for itching. 90 tablet 2    metFORMIN (GLUCOPHAGE) 1000 MG tablet Take 1 tablet (1,000 mg total) by mouth 2 (two) times daily with a meal. 180 tablet 3   triamterene-hydrochlorothiazide (DYAZIDE) 37.5-25 MG capsule TAKE 1 EACH (1 CAPSULE TOTAL) BY MOUTH DAILY. 90 capsule 3   Dulaglutide (TRULICITY) 0.98 JX/9.1YN SOPN Inject 0.75 mg into the skin once a week. (Patient not taking: Reported on 04/25/2021) 0.5 mL 6   fluconazole (DIFLUCAN) 150 MG tablet Take 1 tablet once. May repeat if symptoms return. 2 tablet 0   No facility-administered medications prior to visit.    No Known Allergies  ROS Review of Systems    Objective:    Physical Exam Constitutional:      General: She is not in acute distress.    Appearance: She is obese.  HENT:     Head: Normocephalic and atraumatic.  Cardiovascular:     Rate and Rhythm: Normal rate and regular rhythm.     Pulses: Normal pulses.          Posterior tibial pulses are 2+ on the right side and 2+ on the left side.     Heart sounds: Normal heart sounds.  Pulmonary:     Effort: Pulmonary effort is normal.     Breath sounds: Normal breath sounds.  Abdominal:     Comments: Increased abdominal girth   Musculoskeletal:        General: Normal range of motion.     Cervical back: Normal range of motion.  Feet:     Right foot:     Protective Sensation: 10 sites tested.  10 sites sensed.     Skin integrity: Callus (foot pad upper) present.     Toenail Condition: Right toenails are normal.     Left foot:     Protective Sensation: 10 sites tested.  10 sites sensed.     Skin integrity: Callus (foot pad upper) present.     Toenail Condition: Left toenails are normal.  Skin:    General: Skin is warm and dry.     Capillary Refill: Capillary refill takes less than 2 seconds.  Neurological:     General: No focal deficit present.     Mental Status: She is alert.  Psychiatric:        Mood and Affect: Mood normal.        Behavior: Behavior normal.        Thought Content:  Thought content normal.        Judgment: Judgment normal.   BP 132/84 (BP Location: Right Arm, Patient Position: Sitting)   Pulse 93   Temp 97.9 F (36.6 C)   Ht $R'5\' 10"'GU$  (1.778 m)   Wt (!) 351 lb 0.4 oz (159.2 kg)   SpO2 100%   BMI 50.37 kg/m  Wt Readings from Last 3 Encounters:  04/25/21 Marland Kitchen)  351 lb 0.4 oz (159.2 kg)  01/21/21 (!) 357 lb (161.9 kg)  12/04/20 (!) 358 lb 14.5 oz (162.8 kg)     Health Maintenance Due  Topic Date Due   PNEUMOCOCCAL POLYSACCHARIDE VACCINE AGE 43-64 HIGH RISK  Never done   COVID-19 Vaccine (1) Never done   OPHTHALMOLOGY EXAM  Never done   TETANUS/TDAP  Never done   FOOT EXAM  11/01/2018   URINE MICROALBUMIN  06/24/2021    There are no preventive care reminders to display for this patient.  Lab Results  Component Value Date   TSH 2.340 01/21/2021   Lab Results  Component Value Date   WBC 9.8 01/21/2021   HGB 11.9 01/21/2021   HCT 39.8 01/21/2021   MCV 69 (L) 01/21/2021   PLT 506 (H) 01/21/2021   Lab Results  Component Value Date   NA 138 01/21/2021   K 4.3 01/21/2021   CO2 19 (L) 01/21/2021   GLUCOSE 183 (H) 01/21/2021   BUN 10 01/21/2021   CREATININE 0.67 01/21/2021   BILITOT <0.2 01/21/2021   ALKPHOS 83 01/21/2021   AST 8 01/21/2021   ALT 9 01/21/2021   PROT 8.0 01/21/2021   ALBUMIN 4.0 01/21/2021   CALCIUM 9.7 01/21/2021   ANIONGAP 11 04/27/2020   EGFR 121 01/21/2021   Lab Results  Component Value Date   CHOL 154 01/21/2021   Lab Results  Component Value Date   HDL 37 (L) 01/21/2021   Lab Results  Component Value Date   LDLCALC 86 01/21/2021   Lab Results  Component Value Date   TRIG 180 (H) 01/21/2021   Lab Results  Component Value Date   CHOLHDL 4.2 01/21/2021   Lab Results  Component Value Date   HGBA1C 10.3 (A) 04/25/2021   HGBA1C 10.3 04/25/2021   HGBA1C 10.3 (A) 04/25/2021   HGBA1C 10.3 (A) 04/25/2021      Assessment & Plan:   Problem List Items Addressed This Visit       Endocrine   Type  2 diabetes mellitus without complication, without long-term current use of insulin (HCC) - Primary Worsening elevation in A1c 10.3% Encourage compliance with current treatment regimen  Dose adjustment Januvia 50 mg 1 tablet daily for 1 week then increase to 2 tablets daily Encourage regular CBG monitoring Encourage contacting office if excessive hyperglycemia and or hypoglycemia Lifestyle modification with healthy diet (fewer calories, more high fiber foods, whole grains and non-starchy vegetables, lower fat meat and fish, low-fat diary include healthy oils) regular exercise (physical activity) and weight loss Opthalmology exam discussed  Home BP monitoring also encouraged goal <130/80    Relevant Medications   glipiZIDE (GLUCOTROL) 10 MG tablet   metFORMIN (GLUCOPHAGE) 1000 MG tablet   atorvastatin (LIPITOR) 10 MG tablet   sitaGLIPtin (JANUVIA) 50 MG tablet   Other Relevant Orders   Urinalysis Dipstick   HgB A1c (Completed)   Glucose (CBG) (Completed)   Other Visit Diagnoses     Essential hypertension     Stable  Encouraged on going compliance with current medication regimen Encouraged home monitoring and recording BP <130/80 Eating a heart-healthy diet with less salt Encouraged regular physical activity  Recommend Weight loss    Relevant Medications   triamterene-hydrochlorothiazide (DYAZIDE) 37.5-25 MG capsule   atorvastatin (LIPITOR) 10 MG tablet   Other Relevant Orders   Urinalysis Dipstick   Essential (primary) hypertension       Relevant Medications   triamterene-hydrochlorothiazide (DYAZIDE) 37.5-25 MG capsule   atorvastatin (LIPITOR) 10  MG tablet   Itching       Relevant Medications   hydrOXYzine (ATARAX/VISTARIL) 10 MG tablet       Meds ordered this encounter  Medications   DISCONTD: sitaGLIPtin (JANUVIA) 50 MG tablet    Sig: Take 1 tablet (50 mg total) by mouth daily for 365 doses. For one week then increased to 2 tabs daily    Dispense:  60 tablet     Refill:  11    Order Specific Question:   Supervising Provider    Answer:   Tresa Garter [[id number]]   glucose blood test strip    Sig: Use as instructed    Dispense:  100 each    Refill:  12    Order Specific Question:   Supervising Provider    Answer:   Tresa Garter [[id number]]   glipiZIDE (GLUCOTROL) 10 MG tablet    Sig: Take 1 tablet (10 mg total) by mouth 2 (two) times daily.    Dispense:  180 tablet    Refill:  3    Order Specific Question:   Supervising Provider    Answer:   Tresa Garter [[id number]]   metFORMIN (GLUCOPHAGE) 1000 MG tablet    Sig: Take 1 tablet (1,000 mg total) by mouth 2 (two) times daily with a meal.    Dispense:  180 tablet    Refill:  3    Order Specific Question:   Supervising Provider    Answer:   Tresa Garter [[id number]]   triamterene-hydrochlorothiazide (DYAZIDE) 37.5-25 MG capsule    Sig: TAKE 1 EACH (1 CAPSULE TOTAL) BY MOUTH DAILY.    Dispense:  90 capsule    Refill:  3    Order Specific Question:   Supervising Provider    Answer:   Tresa Garter [id number]   hydrOXYzine (ATARAX/VISTARIL) 10 MG tablet    Sig: Take 1 tablet (10 mg total) by mouth 3 (three) times daily as needed for itching.    Dispense:  90 tablet    Refill:  2    Order Specific Question:   Supervising Provider    Answer:   Tresa Garter [[id number]]   atorvastatin (LIPITOR) 10 MG tablet    Sig: Take 1 tablet (10 mg total) by mouth daily.    Dispense:  90 tablet    Refill:  3    Order Specific Question:   Supervising Provider    Answer:   Tresa Garter [[id number]]   sitaGLIPtin (JANUVIA) 50 MG tablet    Sig: Take 1 tablet (50 mg total) by mouth daily for 365 doses. For one week then increased to 2 tabs daily    Dispense:  60 tablet    Refill:  11    Order Specific Question:   Supervising Provider    Answer:   Tresa Garter [[id number]]   fluconazole (DIFLUCAN) 150 MG tablet    Sig: Take 1 tablet (150 mg total) by mouth once for 1  dose.    Dispense:  1 tablet    Refill:  0    Order Specific Question:   Supervising Provider    Answer:   Tresa Garter [id number]    Follow-up: No follow-ups on file.    Vevelyn Francois, NP

## 2021-04-26 LAB — COMP. METABOLIC PANEL (12)
AST: 9 IU/L (ref 0–40)
Albumin/Globulin Ratio: 1.1 — ABNORMAL LOW (ref 1.2–2.2)
Albumin: 3.8 g/dL (ref 3.8–4.8)
Alkaline Phosphatase: 69 IU/L (ref 44–121)
BUN/Creatinine Ratio: 13 (ref 9–23)
BUN: 9 mg/dL (ref 6–20)
Bilirubin Total: <0.2 mg/dL (ref 0.0–1.2)
Calcium: 9.4 mg/dL (ref 8.7–10.2)
Chloride: 101 mmol/L (ref 96–106)
Creatinine, Ser: 0.68 mg/dL (ref 0.57–1.00)
Globulin, Total: 3.5 g/dL (ref 1.5–4.5)
Glucose: 181 mg/dL — ABNORMAL HIGH (ref 65–99)
Potassium: 4.2 mmol/L (ref 3.5–5.2)
Sodium: 135 mmol/L (ref 134–144)
Total Protein: 7.3 g/dL (ref 6.0–8.5)
eGFR: 119 mL/min/{1.73_m2} (ref >59)

## 2021-04-26 LAB — LIPID PANEL
Chol/HDL Ratio: 4.2 ratio (ref 0.0–4.4)
Cholesterol, Total: 143 mg/dL (ref 100–199)
HDL: 34 mg/dL — ABNORMAL LOW (ref >39)
LDL Chol Calc (NIH): 83 mg/dL (ref 0–99)
Triglycerides: 147 mg/dL (ref 0–149)
VLDL Cholesterol Cal: 26 mg/dL (ref 5–40)

## 2021-04-26 LAB — MICROALBUMIN, URINE: Microalbumin, Urine: 35.7 ug/mL

## 2021-04-30 ENCOUNTER — Other Ambulatory Visit: Payer: Self-pay | Admitting: Nurse Practitioner

## 2021-04-30 DIAGNOSIS — N760 Acute vaginitis: Secondary | ICD-10-CM

## 2021-04-30 DIAGNOSIS — B379 Candidiasis, unspecified: Secondary | ICD-10-CM

## 2021-04-30 DIAGNOSIS — B9689 Other specified bacterial agents as the cause of diseases classified elsewhere: Secondary | ICD-10-CM

## 2021-04-30 LAB — NUSWAB VAGINITIS PLUS (VG+)
Atopobium vaginae: HIGH Score — AB
BVAB 2: HIGH Score — AB
Candida albicans, NAA: POSITIVE — AB
Candida glabrata, NAA: NEGATIVE
Chlamydia trachomatis, NAA: NEGATIVE
Neisseria gonorrhoeae, NAA: NEGATIVE
Trich vag by NAA: NEGATIVE

## 2021-04-30 MED ORDER — METRONIDAZOLE 500 MG PO TABS
500.0000 mg | ORAL_TABLET | Freq: Three times a day (TID) | ORAL | 0 refills | Status: AC
Start: 2021-04-30 — End: 2021-05-07

## 2021-04-30 NOTE — Progress Notes (Signed)
   [facility] [address] [place], [place]  [address] Phone:  [phone]   Fax:  [phone] 

## 2021-05-02 ENCOUNTER — Other Ambulatory Visit: Payer: Self-pay

## 2021-05-21 DIAGNOSIS — E119 Type 2 diabetes mellitus without complications: Secondary | ICD-10-CM | POA: Diagnosis not present

## 2021-05-22 ENCOUNTER — Telehealth: Payer: Self-pay

## 2021-05-22 NOTE — Telephone Encounter (Signed)
Med refill for yeast infection medicine

## 2021-07-04 ENCOUNTER — Ambulatory Visit: Payer: Self-pay | Admitting: Nurse Practitioner

## 2021-07-28 ENCOUNTER — Ambulatory Visit: Payer: Self-pay | Admitting: Family Medicine

## 2021-07-28 ENCOUNTER — Ambulatory Visit: Payer: Self-pay | Admitting: Nurse Practitioner

## 2021-12-12 ENCOUNTER — Encounter (HOSPITAL_COMMUNITY): Payer: Self-pay | Admitting: Emergency Medicine

## 2021-12-12 ENCOUNTER — Other Ambulatory Visit: Payer: Self-pay

## 2021-12-12 ENCOUNTER — Emergency Department (HOSPITAL_COMMUNITY)
Admission: EM | Admit: 2021-12-12 | Discharge: 2021-12-13 | Disposition: A | Discharge status: Home / Self Care | Payer: Federal, State, Local not specified - PPO | Attending: Emergency Medicine | Admitting: Emergency Medicine

## 2021-12-12 DIAGNOSIS — Z20822 Contact with and (suspected) exposure to covid-19: Secondary | ICD-10-CM | POA: Insufficient documentation

## 2021-12-12 DIAGNOSIS — Z79899 Other long term (current) drug therapy: Secondary | ICD-10-CM | POA: Diagnosis not present

## 2021-12-12 DIAGNOSIS — R531 Weakness: Secondary | ICD-10-CM | POA: Insufficient documentation

## 2021-12-12 DIAGNOSIS — R079 Chest pain, unspecified: Secondary | ICD-10-CM | POA: Diagnosis not present

## 2021-12-12 DIAGNOSIS — I10 Essential (primary) hypertension: Secondary | ICD-10-CM | POA: Diagnosis not present

## 2021-12-12 DIAGNOSIS — E1165 Type 2 diabetes mellitus with hyperglycemia: Secondary | ICD-10-CM | POA: Diagnosis not present

## 2021-12-12 DIAGNOSIS — N9489 Other specified conditions associated with female genital organs and menstrual cycle: Secondary | ICD-10-CM | POA: Insufficient documentation

## 2021-12-12 DIAGNOSIS — R739 Hyperglycemia, unspecified: Secondary | ICD-10-CM

## 2021-12-12 DIAGNOSIS — Z7984 Long term (current) use of oral hypoglycemic drugs: Secondary | ICD-10-CM | POA: Insufficient documentation

## 2021-12-12 DIAGNOSIS — R0789 Other chest pain: Secondary | ICD-10-CM | POA: Diagnosis not present

## 2021-12-12 DIAGNOSIS — R059 Cough, unspecified: Secondary | ICD-10-CM | POA: Diagnosis not present

## 2021-12-12 DIAGNOSIS — J069 Acute upper respiratory infection, unspecified: Secondary | ICD-10-CM | POA: Diagnosis not present

## 2021-12-12 NOTE — ED Provider Triage Note (Signed)
Emergency Medicine Provider Triage Evaluation Note  Grace Daniels , a 31 y.o. female  was evaluated in triage.  Pt complains of high sugars, headache, chest pain, cough and congestion for about 5-7 days.    Review of Systems  Positive:  Negative: See hpi  Physical Exam  BP (!) 152/119 (BP Location: Left Arm)    Pulse (!) 106    Temp 98.4 F (36.9 C) (Oral)    Resp 16    SpO2 96%  Gen:   Awake, no distress   Resp:  Normal effort  MSK:   Moves extremities without difficulty  Other:  Normal speech.   Medical Decision Making  Medically screening exam initiated at 11:55 PM.  Appropriate orders placed.  AJIA CHADDERDON was informed that the remainder of the evaluation will be completed by another provider, this initial triage assessment does not replace that evaluation, and the importance of remaining in the ED until their evaluation is complete.  Note: Portions of this report may have been transcribed using voice recognition software. Every effort was made to ensure accuracy; however, inadvertent computerized transcription errors may be present    Cristina Gong, PA-C 12/12/21 2357

## 2021-12-12 NOTE — ED Triage Notes (Signed)
Patient arrives complaining of flu-like symptoms with associated hyperglycemia. Patient denies hx of DKA, endorses DM. Patient states that symptoms began approx. on Monday or Tuesday.

## 2021-12-13 ENCOUNTER — Emergency Department (HOSPITAL_COMMUNITY): Payer: Federal, State, Local not specified - PPO

## 2021-12-13 DIAGNOSIS — R059 Cough, unspecified: Secondary | ICD-10-CM | POA: Diagnosis not present

## 2021-12-13 LAB — CBC WITH DIFFERENTIAL/PLATELET
Abs Immature Granulocytes: 0.02 10*3/uL (ref 0.00–0.07)
Basophils Absolute: 0.1 10*3/uL (ref 0.0–0.1)
Basophils Relative: 1 %
Eosinophils Absolute: 0.1 10*3/uL (ref 0.0–0.5)
Eosinophils Relative: 1 %
HCT: 38.2 % (ref 36.0–46.0)
Hemoglobin: 12 g/dL (ref 12.0–15.0)
Immature Granulocytes: 0 %
Lymphocytes Relative: 45 %
Lymphs Abs: 4.5 10*3/uL — ABNORMAL HIGH (ref 0.7–4.0)
MCH: 20.4 pg — ABNORMAL LOW (ref 26.0–34.0)
MCHC: 31.4 g/dL (ref 30.0–36.0)
MCV: 64.9 fL — ABNORMAL LOW (ref 80.0–100.0)
Monocytes Absolute: 0.7 10*3/uL (ref 0.1–1.0)
Monocytes Relative: 8 %
Neutro Abs: 4.5 10*3/uL (ref 1.7–7.7)
Neutrophils Relative %: 45 %
Platelets: 418 10*3/uL — ABNORMAL HIGH (ref 150–400)
RBC: 5.89 MIL/uL — ABNORMAL HIGH (ref 3.87–5.11)
RDW: 18.6 % — ABNORMAL HIGH (ref 11.5–15.5)
WBC: 9.9 10*3/uL (ref 4.0–10.5)
nRBC: 0 % (ref 0.0–0.2)

## 2021-12-13 LAB — COMPREHENSIVE METABOLIC PANEL
ALT: 15 U/L (ref 0–44)
AST: 14 U/L — ABNORMAL LOW (ref 15–41)
Albumin: 4 g/dL (ref 3.5–5.0)
Alkaline Phosphatase: 73 U/L (ref 38–126)
Anion gap: 8 (ref 5–15)
BUN: 10 mg/dL (ref 6–20)
CO2: 24 mmol/L (ref 22–32)
Calcium: 9.2 mg/dL (ref 8.9–10.3)
Chloride: 99 mmol/L (ref 98–111)
Creatinine, Ser: 0.54 mg/dL (ref 0.44–1.00)
GFR, Estimated: >60 mL/min (ref >60)
Glucose, Bld: 270 mg/dL — ABNORMAL HIGH (ref 70–99)
Potassium: 4 mmol/L (ref 3.5–5.1)
Sodium: 131 mmol/L — ABNORMAL LOW (ref 135–145)
Total Bilirubin: 0.4 mg/dL (ref 0.3–1.2)
Total Protein: 8.2 g/dL — ABNORMAL HIGH (ref 6.5–8.1)

## 2021-12-13 LAB — URINALYSIS, ROUTINE W REFLEX MICROSCOPIC
Bacteria, UA: NONE SEEN
Bilirubin Urine: NEGATIVE
Glucose, UA: >=500 mg/dL — AB
Hgb urine dipstick: NEGATIVE
Ketones, ur: NEGATIVE mg/dL
Leukocytes,Ua: NEGATIVE
Nitrite: NEGATIVE
Protein, ur: NEGATIVE mg/dL
Specific Gravity, Urine: 1.023 (ref 1.005–1.030)
pH: 7 (ref 5.0–8.0)

## 2021-12-13 LAB — BLOOD GAS, VENOUS
Acid-Base Excess: 0.4 mmol/L (ref 0.0–2.0)
Bicarbonate: 24.8 mmol/L (ref 20.0–28.0)
O2 Saturation: 90.3 %
Patient temperature: 98.6
pCO2, Ven: 41.2 mmHg — ABNORMAL LOW (ref 44.0–60.0)
pH, Ven: 7.397 (ref 7.250–7.430)
pO2, Ven: 61.9 mmHg — ABNORMAL HIGH (ref 32.0–45.0)

## 2021-12-13 LAB — RESP PANEL BY RT-PCR (FLU A&B, COVID) ARPGX2
Influenza A by PCR: NEGATIVE
Influenza B by PCR: NEGATIVE
SARS Coronavirus 2 by RT PCR: NEGATIVE

## 2021-12-13 LAB — CBG MONITORING, ED: Glucose-Capillary: 289 mg/dL — ABNORMAL HIGH (ref 70–99)

## 2021-12-13 LAB — BETA-HYDROXYBUTYRIC ACID: Beta-Hydroxybutyric Acid: 0.12 mmol/L (ref 0.05–0.27)

## 2021-12-13 LAB — TROPONIN I (HIGH SENSITIVITY)
Troponin I (High Sensitivity): <2 ng/L (ref <18)
Troponin I (High Sensitivity): 2 ng/L (ref <18)

## 2021-12-13 LAB — I-STAT BETA HCG BLOOD, ED (MC, WL, AP ONLY): I-stat hCG, quantitative: <5 m[IU]/mL (ref <5)

## 2021-12-13 LAB — D-DIMER, QUANTITATIVE: D-Dimer, Quant: <0.27 ug/mL-FEU (ref 0.00–0.50)

## 2021-12-13 MED ORDER — KETOROLAC TROMETHAMINE 30 MG/ML IJ SOLN
30.0000 mg | Freq: Once | INTRAMUSCULAR | Status: AC
  Administered 2021-12-13: 30 mg via INTRAVENOUS
  Filled 2021-12-13: qty 1

## 2021-12-13 MED ORDER — ACETAMINOPHEN 325 MG PO TABS
650.0000 mg | ORAL_TABLET | Freq: Once | ORAL | Status: AC
Start: 2021-12-13 — End: 2021-12-13
  Administered 2021-12-13: 650 mg via ORAL
  Filled 2021-12-13: qty 2

## 2021-12-13 MED ORDER — SODIUM CHLORIDE 0.9 % IV BOLUS
1000.0000 mL | Freq: Once | INTRAVENOUS | Status: AC
  Administered 2021-12-13: 1000 mL via INTRAVENOUS

## 2021-12-13 NOTE — ED Provider Notes (Signed)
[place] DEPT Provider Note   CSN: [id number] Arrival date & time: 12/12/21  2309     History  Chief Complaint  Patient presents with   Headache   Nasal Congestion   Hyperglycemia    Grace Daniels is a 31 y.o. female.  Patient with history of diabetes and hypertension here with a 5-day history of body aches, coughing, sneezing, headache, sore throat and chest pain with coughing.  Complains of gradual onset headache, dry cough, sneezing, runny nose, sore throat.  Chest pain is very brief and last for few seconds every time she coughs.  No radiation of the pain.  Denies shortness of breath.  Denies abdominal pain.  No nausea, vomiting or diarrhea.  No pain with urination or blood in the urine.  Reports subjective fever 2 days ago but none since.  Did not check her temperature at home. No travel or sick contacts.  No leg pain or leg swelling. Denies thunderclap onset headache.  States sugar was over 400 at home but has improved here to the 200 range.  She is on p.o. medications for her hyperglycemia but does not take insulin  The history is provided by the patient.  Headache Associated symptoms: congestion, cough, fatigue, fever, myalgias, sore throat and weakness   Associated symptoms: no abdominal pain, no dizziness, no nausea and no vomiting   Hyperglycemia Associated symptoms: fatigue, fever and weakness   Associated symptoms: no abdominal pain, no chest pain, no dizziness, no dysuria, no nausea and no vomiting       Home Medications Prior to Admission medications   Medication Sig Start Date End Date Taking? Authorizing Provider  albuterol (VENTOLIN HFA) 108 (90 Base) MCG/ACT inhaler Inhale 1-2 puffs into the lungs every 6 (six) hours as needed for wheezing or shortness of breath. 12/04/20   Palumbo, April, MD  atorvastatin (LIPITOR) 10 MG tablet Take 1 tablet (10 mg total) by mouth daily. 04/25/21   Vevelyn Francois, NP  blood glucose meter kit  and supplies KIT Dispense based on patient and insurance preference. Use up to four times daily as directed. (FOR ICD-9 250.00, 250.01). 01/27/19   Lanae Boast, FNP  Blood Glucose Monitoring Suppl [facility] BLOOD GLUCOSE METER) w/Device KIT 1 applicator by Does not apply route 4 (four) times daily -  before meals and at bedtime. 05/16/18   Azzie Glatter, FNP  Blood Pressure Monitoring (BLOOD PRESSURE MONITOR AUTOMAT) DEVI 1 kit by Does not apply route daily. 04/25/21   Vevelyn Francois, NP  diclofenac (VOLTAREN) 75 MG EC tablet Take 1 tablet (75 mg total) by mouth 2 (two) times daily. 03/28/21   Wallene Huh, DPM  glipiZIDE (GLUCOTROL) 10 MG tablet Take 1 tablet (10 mg total) by mouth 2 (two) times daily. 04/25/21   Vevelyn Francois, NP  glucose blood test strip Use as instructed 04/25/21   Vevelyn Francois, NP  hydrOXYzine (ATARAX/VISTARIL) 10 MG tablet Take 1 tablet (10 mg total) by mouth 3 (three) times daily as needed for itching. 04/25/21   Vevelyn Francois, NP  metFORMIN (GLUCOPHAGE) 1000 MG tablet Take 1 tablet (1,000 mg total) by mouth 2 (two) times daily with a meal. 04/25/21 04/25/22  Vevelyn Francois, NP  sitaGLIPtin (JANUVIA) 50 MG tablet Take 1 tablet (50 mg total) by mouth daily. For one week then increased to 2 tabs daily 04/25/21   Vevelyn Francois, NP  triamterene-hydrochlorothiazide (DYAZIDE) 37.5-25 MG capsule TAKE 1 EACH (1 CAPSULE  TOTAL) BY MOUTH DAILY. 04/25/21   Vevelyn Francois, NP  Vitamin D, Ergocalciferol, (DRISDOL) 1.25 MG (50000 UNIT) CAPS capsule Take 1 capsule (50,000 Units total) by mouth every 7 (seven) days. 01/22/21   Azzie Glatter, FNP      Allergies    Patient has no known allergies.    Review of Systems   Review of Systems  Constitutional:  Positive for activity change, appetite change, fatigue and fever.  HENT:  Positive for congestion, rhinorrhea and sore throat.   Respiratory:  Positive for cough.   Cardiovascular:  Negative for chest pain.  Gastrointestinal:   Negative for abdominal pain, nausea and vomiting.  Genitourinary:  Negative for dysuria and hematuria.  Musculoskeletal:  Positive for arthralgias and myalgias.  Skin:  Negative for rash.  Neurological:  Positive for weakness and headaches. Negative for dizziness and light-headedness.   all other systems are negative except as noted in the HPI and PMH.   Physical Exam Updated Vital Signs BP 127/77 (BP Location: Right Arm)    Pulse 88    Temp 98.4 F (36.9 C) (Oral)    Resp 18    SpO2 99%  Physical Exam Vitals and nursing note reviewed.  Constitutional:      General: She is not in acute distress.    Appearance: She is well-developed. She is obese.     Comments: No distress, speaking in full sentences.   HENT:     Head: Normocephalic and atraumatic.     Mouth/Throat:     Pharynx: No oropharyngeal exudate.  Eyes:     Conjunctiva/sclera: Conjunctivae normal.     Pupils: Pupils are equal, round, and reactive to light.  Neck:     Comments: No meningismus. Cardiovascular:     Rate and Rhythm: Normal rate and regular rhythm.     Heart sounds: Normal heart sounds. No murmur heard. Pulmonary:     Effort: Pulmonary effort is normal. No respiratory distress.     Breath sounds: Normal breath sounds. No wheezing.  Abdominal:     Palpations: Abdomen is soft.     Tenderness: There is no abdominal tenderness. There is no guarding or rebound.  Musculoskeletal:        General: No tenderness. Normal range of motion.     Cervical back: Normal range of motion and neck supple.  Skin:    General: Skin is warm.  Neurological:     General: No focal deficit present.     Mental Status: She is alert and oriented to person, place, and time. Mental status is at baseline.     Cranial Nerves: No cranial nerve deficit.     Motor: No abnormal muscle tone.     Coordination: Coordination normal.     Comments:  5/5 strength throughout. CN 2-12 intact.Equal grip strength.   Psychiatric:        Behavior:  Behavior normal.    ED Results / Procedures / Treatments   Labs (all labs ordered are listed, but only abnormal results are displayed) Labs Reviewed  CBC WITH DIFFERENTIAL/PLATELET - Abnormal; Notable for the following components:      Result Value   RBC 5.89 (*)    MCV 64.9 (*)    MCH 20.4 (*)    RDW 18.6 (*)    Platelets 418 (*)    Lymphs Abs 4.5 (*)    All other components within normal limits  COMPREHENSIVE METABOLIC PANEL - Abnormal; Notable for the following components:   Sodium  131 (*)    Glucose, Bld 270 (*)    Total Protein 8.2 (*)    AST 14 (*)    All other components within normal limits  URINALYSIS, ROUTINE W REFLEX MICROSCOPIC - Abnormal; Notable for the following components:   Color, Urine STRAW (*)    Glucose, UA >=500 (*)    All other components within normal limits  BLOOD GAS, VENOUS - Abnormal; Notable for the following components:   pCO2, Ven 41.2 (*)    pO2, Ven 61.9 (*)    All other components within normal limits  CBG MONITORING, ED - Abnormal; Notable for the following components:   Glucose-Capillary 289 (*)    All other components within normal limits  RESP PANEL BY RT-PCR (FLU A&B, COVID) ARPGX2  BETA-HYDROXYBUTYRIC ACID  D-DIMER, QUANTITATIVE  I-STAT BETA HCG BLOOD, ED ([facility], [facility], [facility] ONLY)  TROPONIN I (HIGH SENSITIVITY)  TROPONIN I (HIGH SENSITIVITY)    EKG EKG Interpretation  Date/Time:  Saturday December 13 2021 00:06:54 EST Ventricular Rate:  92 PR Interval:  145 QRS Duration: 97 QT Interval:  365 QTC Calculation: 452 R Axis:   58 Text Interpretation: Sinus rhythm Baseline wander in lead(s) III No significant change was found Confirmed by Ezequiel Essex [phone]) on 12/13/2021 1:48:48 AM  Radiology DG Chest 2 View  Result Date: 12/13/2021 CLINICAL DATA:  Cough and congestion for several days, initial encounter EXAM: CHEST - 2 VIEW COMPARISON:  04/27/2020 FINDINGS: The heart size and mediastinal contours are within normal limits. Both  lungs are clear. The visualized skeletal structures are unremarkable. IMPRESSION: No active cardiopulmonary disease. Electronically Signed   By: Inez Catalina M.D.   On: 12/13/2021 00:32    Procedures Procedures    Medications Ordered in ED Medications  sodium chloride 0.9 % bolus 1,000 mL (has no administration in time range)  ketorolac (TORADOL) 30 MG/ML injection 30 mg (has no administration in time range)  acetaminophen (TYLENOL) tablet 650 mg (has no administration in time range)    ED Course/ Medical Decision Making/ A&P                           Medical Decision Making Amount and/or Complexity of Data Reviewed Labs: ordered.  Risk OTC drugs. Prescription drug management.  5-day history of URI symptoms with subjective fever.  Stable vitals, no distress, no increased work of breathing or hypoxia.  Blood glucose is 270.  Anion gap is normal.  No evidence of DKA.  Suspect likely viral illness.  COVID and flu swabs are negative but discussed possibility that this could be false negative. Recommend quarantine precautions while symptomatic.   Low suspicion for ACS or PE.  Troponin drawn in triage is negative.  Initial troponin was misplaced but troponin at 5 AM is negative which is reassuring in the setting of multiple days of constant symptoms.  Her chest pain description is atypical for ACS and very fleeting for a few seconds at a time. chest x-ray is negative for infiltrate  Headache is gradual in onset and similar to previous.  No thunderclap onset.  Low suspicion for subarachnoid hemorrhage, meningitis, or temporal arteritis.  Follow-up discussed with PCP.  Discussed p.o. hydration at home, antipyretics, quarantine precautions while having symptoms.  Return to the ED exertional chest pain, pain associate with shortness of breath, nausea, vomiting, sweating, any other concerns.        Final Clinical Impression(s) / ED Diagnoses Final diagnoses:  Upper  respiratory  tract infection, unspecified type  Hyperglycemia    Rx / DC Orders ED Discharge Orders     None         Velencia Lenart, Annie Main, MD 12/13/21 [id number]

## 2021-12-13 NOTE — Discharge Instructions (Addendum)
Your COVID and flu tests are negative.  However they are still possible as test are not 100%.  She is to keep yourself quarantined while you are having symptoms.  Monitor your blood sugars carefully and use Tylenol or Motrin as needed for aches and fever. Follow-up with your doctor.  Return to the ED with exertional chest pain, chest pain associate with shortness of breath, nausea, vomiting, sweating, any other concerns.

## 2022-03-26 ENCOUNTER — Encounter (HOSPITAL_COMMUNITY): Payer: Self-pay

## 2022-03-26 ENCOUNTER — Emergency Department (HOSPITAL_COMMUNITY): Payer: Federal, State, Local not specified - PPO

## 2022-03-26 ENCOUNTER — Emergency Department (HOSPITAL_COMMUNITY)
Admission: EM | Admit: 2022-03-26 | Discharge: 2022-03-26 | Disposition: A | Discharge status: Home / Self Care | Payer: Federal, State, Local not specified - PPO | Attending: Emergency Medicine | Admitting: Emergency Medicine

## 2022-03-26 ENCOUNTER — Other Ambulatory Visit: Payer: Self-pay

## 2022-03-26 DIAGNOSIS — S4992XA Unspecified injury of left shoulder and upper arm, initial encounter: Secondary | ICD-10-CM | POA: Diagnosis not present

## 2022-03-26 DIAGNOSIS — Z23 Encounter for immunization: Secondary | ICD-10-CM | POA: Diagnosis not present

## 2022-03-26 DIAGNOSIS — S80212A Abrasion, left knee, initial encounter: Secondary | ICD-10-CM | POA: Insufficient documentation

## 2022-03-26 DIAGNOSIS — M25532 Pain in left wrist: Secondary | ICD-10-CM | POA: Diagnosis not present

## 2022-03-26 DIAGNOSIS — M25562 Pain in left knee: Secondary | ICD-10-CM

## 2022-03-26 DIAGNOSIS — M25512 Pain in left shoulder: Secondary | ICD-10-CM | POA: Insufficient documentation

## 2022-03-26 DIAGNOSIS — S63502A Unspecified sprain of left wrist, initial encounter: Secondary | ICD-10-CM | POA: Insufficient documentation

## 2022-03-26 DIAGNOSIS — W19XXXA Unspecified fall, initial encounter: Secondary | ICD-10-CM

## 2022-03-26 DIAGNOSIS — S6992XA Unspecified injury of left wrist, hand and finger(s), initial encounter: Secondary | ICD-10-CM | POA: Diagnosis not present

## 2022-03-26 DIAGNOSIS — S8992XA Unspecified injury of left lower leg, initial encounter: Secondary | ICD-10-CM | POA: Diagnosis not present

## 2022-03-26 DIAGNOSIS — Y92410 Unspecified street and highway as the place of occurrence of the external cause: Secondary | ICD-10-CM | POA: Diagnosis not present

## 2022-03-26 MED ORDER — IBUPROFEN 800 MG PO TABS
800.0000 mg | ORAL_TABLET | Freq: Once | ORAL | Status: AC
  Administered 2022-03-26: 800 mg via ORAL
  Filled 2022-03-26: qty 1

## 2022-03-26 MED ORDER — TETANUS-DIPHTH-ACELL PERTUSSIS 5-2.5-18.5 LF-MCG/0.5 IM SUSY
0.5000 mL | PREFILLED_SYRINGE | Freq: Once | INTRAMUSCULAR | Status: AC
  Administered 2022-03-26: 0.5 mL via INTRAMUSCULAR
  Filled 2022-03-26: qty 0.5

## 2022-03-26 NOTE — Discharge Instructions (Signed)
Your xrays were reassuring today without signs of fractures or dislocations. It is recommended that you follow up with Orthopedist Dr. Susa Simmonds for further evaluation.  ? ?Use the crutches and knee immobilizer as indicated. While at home please rest, ice, and elevate your leg to help reduce pain/inflammation. You can do the same with your wrist.  ? ?You can take Ibuprofen and Tylenol as needed for pain. Continue applying neosporin to your abrasion to help prevent infection.  ? ?Return to the ED for any new/worsening symptoms ?
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
## 2022-03-26 NOTE — ED Notes (Signed)
An After Visit Summary was printed and given to the patient. Discharge instructions given and no further questions at this time.  

## 2022-03-30 ENCOUNTER — Ambulatory Visit (INDEPENDENT_AMBULATORY_CARE_PROVIDER_SITE_OTHER): Payer: Federal, State, Local not specified - PPO | Admitting: Nurse Practitioner

## 2022-03-30 ENCOUNTER — Encounter: Payer: Self-pay | Admitting: Nurse Practitioner

## 2022-03-30 VITALS — BP 130/74 | HR 99 | Resp 16

## 2022-03-30 DIAGNOSIS — I10 Essential (primary) hypertension: Secondary | ICD-10-CM

## 2022-03-30 DIAGNOSIS — E119 Type 2 diabetes mellitus without complications: Secondary | ICD-10-CM

## 2022-03-30 LAB — POCT URINALYSIS DIP (CLINITEK)
Bilirubin, UA: NEGATIVE
Blood, UA: NEGATIVE
Glucose, UA: 500 mg/dL — AB
Ketones, POC UA: NEGATIVE mg/dL
Leukocytes, UA: NEGATIVE
Nitrite, UA: NEGATIVE
POC PROTEIN,UA: NEGATIVE
Spec Grav, UA: 1.02 (ref 1.010–1.025)
Urobilinogen, UA: 0.2 E.U./dL
pH, UA: 5.5 (ref 5.0–8.0)

## 2022-03-30 LAB — POCT GLYCOSYLATED HEMOGLOBIN (HGB A1C)
HbA1c POC (<> result, manual entry): 10.6 % (ref 4.0–5.6)
HbA1c, POC (controlled diabetic range): 10.6 % — AB (ref 0.0–7.0)
HbA1c, POC (prediabetic range): 10.6 % — AB (ref 5.7–6.4)
Hemoglobin A1C: 10.6 % — AB (ref 4.0–5.6)

## 2022-03-30 MED ORDER — GLIPIZIDE 10 MG PO TABS: 10.0000 mg | ORAL_TABLET | Freq: Two times a day (BID) | ORAL | 3 refills | Status: DC

## 2022-03-30 MED ORDER — TRIAMTERENE-HCTZ 37.5-25 MG PO CAPS: ORAL_CAPSULE | ORAL | 3 refills | Status: DC

## 2022-03-30 MED ORDER — FLUCONAZOLE 150 MG PO TABS
150.0000 mg | ORAL_TABLET | Freq: Once | ORAL | 0 refills | Status: AC
Start: 2022-03-30 — End: 2022-03-30

## 2022-03-30 MED ORDER — GLUCOSE BLOOD VI STRP: ORAL_STRIP | 12 refills | Status: DC

## 2022-03-30 MED ORDER — ATORVASTATIN CALCIUM 10 MG PO TABS: 10.0000 mg | ORAL_TABLET | Freq: Every day | ORAL | 3 refills | Status: DC

## 2022-03-30 MED ORDER — METFORMIN HCL 1000 MG PO TABS: 1000.0000 mg | ORAL_TABLET | Freq: Two times a day (BID) | ORAL | 3 refills | Status: DC

## 2022-03-30 NOTE — Assessment & Plan Note (Signed)
1. Type 2 diabetes mellitus without complication, without long-term current use of insulin (HCC) ? ?- atorvastatin (LIPITOR) 10 MG tablet; Take 1 tablet (10 mg total) by mouth daily.  Dispense: 90 tablet; Refill: 3 ?- glipiZIDE (GLUCOTROL) 10 MG tablet; Take 1 tablet (10 mg total) by mouth 2 (two) times daily.  Dispense: 180 tablet; Refill: 3 ?- metFORMIN (GLUCOPHAGE) 1000 MG tablet; Take 1 tablet (1,000 mg total) by mouth 2 (two) times daily with a meal.  Dispense: 180 tablet; Refill: 3 ?- triamterene-hydrochlorothiazide (DYAZIDE) 37.5-25 MG capsule; TAKE 1 EACH (1 CAPSULE TOTAL) BY MOUTH DAILY.  Dispense: 90 capsule; Refill: 3 ?- Lipid Panel ?- CBC ?- Comprehensive metabolic panel ?- fluconazole (DIFLUCAN) 150 MG tablet; Take 1 tablet (150 mg total) by mouth once for 1 dose.  Dispense: 1 tablet; Refill: 0 ?- glucose blood test strip; Use as instructed  Dispense: 100 each; Refill: 12 ? ?2. Essential (primary) hypertension ? ?- triamterene-hydrochlorothiazide (DYAZIDE) 37.5-25 MG capsule; TAKE 1 EACH (1 CAPSULE TOTAL) BY MOUTH DAILY.  Dispense: 90 capsule; Refill: 3 ? ? ?Follow up: ? ?Follow up in 4 weeks or sooner if needed ?

## 2022-03-30 NOTE — Patient Instructions (Signed)
1. Type 2 diabetes mellitus without complication, without long-term current use of insulin (HCC) ? ?- atorvastatin (LIPITOR) 10 MG tablet; Take 1 tablet (10 mg total) by mouth daily.  Dispense: 90 tablet; Refill: 3 ?- glipiZIDE (GLUCOTROL) 10 MG tablet; Take 1 tablet (10 mg total) by mouth 2 (two) times daily.  Dispense: 180 tablet; Refill: 3 ?- metFORMIN (GLUCOPHAGE) 1000 MG tablet; Take 1 tablet (1,000 mg total) by mouth 2 (two) times daily with a meal.  Dispense: 180 tablet; Refill: 3 ?- triamterene-hydrochlorothiazide (DYAZIDE) 37.5-25 MG capsule; TAKE 1 EACH (1 CAPSULE TOTAL) BY MOUTH DAILY.  Dispense: 90 capsule; Refill: 3 ?- Lipid Panel ?- CBC ?- Comprehensive metabolic panel ?- fluconazole (DIFLUCAN) 150 MG tablet; Take 1 tablet (150 mg total) by mouth once for 1 dose.  Dispense: 1 tablet; Refill: 0 ?- glucose blood test strip; Use as instructed  Dispense: 100 each; Refill: 12 ? ?2. Essential (primary) hypertension ? ?- triamterene-hydrochlorothiazide (DYAZIDE) 37.5-25 MG capsule; TAKE 1 EACH (1 CAPSULE TOTAL) BY MOUTH DAILY.  Dispense: 90 capsule; Refill: 3 ? ? ?Follow up: ? ?Follow up in 4 weeks or sooner if needed ?

## 2022-03-30 NOTE — Progress Notes (Signed)
$'@Patient'I$  ID: Grace Daniels, female    DOB: [birth year], 31 y.o.   MRN: [id number]  Chief Complaint  Patient presents with   Follow-up    Referring provider: Vevelyn Francois, NP   HPI  Grace Daniels presents for follow up. A former patient of NP Stroud. She  has a past medical history of Coronavirus infection (11/2020), Diabetes ([place]), Hyperlipidemia, Hypertension, Morbid obesity ([place]), Right knee pain (05/2020), Right thigh pain (05/2020), Sleep apnea, and Vitamin D deficiency (01/2021).    Patient presents today for follow-up for diabetes. Patient states that she may have yeast infection. Is requesting swab. Patient has been compliant with diabetes and hypertension medication. Patient is overdue for blood work. Denies f/c/s, n/v/d, hemoptysis, PND, chest pain or edema.      No Known Allergies  Immunization History  Administered Date(s) Administered   Tdap 03/26/2022    Past Medical History:  Diagnosis Date   Coronavirus infection 11/2020   Diabetes ([place])    Hyperlipidemia    Hypertension    Morbid obesity ([place])    Right knee pain 05/2020   Right thigh pain 05/2020   Sleep apnea    Vitamin D deficiency 01/2021    Tobacco History: Social History   Tobacco Use  Smoking Status Never  Smokeless Tobacco Never   Counseling given: Not Answered   Outpatient Encounter Medications as of 03/30/2022  Medication Sig   fluconazole (DIFLUCAN) 150 MG tablet Take 1 tablet (150 mg total) by mouth once for 1 dose.   albuterol (VENTOLIN HFA) 108 (90 Base) MCG/ACT inhaler Inhale 1-2 puffs into the lungs every 6 (six) hours as needed for wheezing or shortness of breath.   atorvastatin (LIPITOR) 10 MG tablet Take 1 tablet (10 mg total) by mouth daily.   blood glucose meter kit and supplies KIT Dispense based on patient and insurance preference. Use up to four times daily as directed. (FOR ICD-9 250.00, 250.01).   Blood Glucose Monitoring Suppl (ACURA BLOOD GLUCOSE METER) w/Device  KIT 1 applicator by Does not apply route 4 (four) times daily -  before meals and at bedtime.   Blood Pressure Monitoring (BLOOD PRESSURE MONITOR AUTOMAT) DEVI 1 kit by Does not apply route daily.   diclofenac (VOLTAREN) 75 MG EC tablet Take 1 tablet (75 mg total) by mouth 2 (two) times daily.   glipiZIDE (GLUCOTROL) 10 MG tablet Take 1 tablet (10 mg total) by mouth 2 (two) times daily.   glucose blood test strip Use as instructed   hydrOXYzine (ATARAX/VISTARIL) 10 MG tablet Take 1 tablet (10 mg total) by mouth 3 (three) times daily as needed for itching.   metFORMIN (GLUCOPHAGE) 1000 MG tablet Take 1 tablet (1,000 mg total) by mouth 2 (two) times daily with a meal.   triamterene-hydrochlorothiazide (DYAZIDE) 37.5-25 MG capsule TAKE 1 EACH (1 CAPSULE TOTAL) BY MOUTH DAILY.   Vitamin D, Ergocalciferol, (DRISDOL) 1.25 MG (50000 UNIT) CAPS capsule Take 1 capsule (50,000 Units total) by mouth every 7 (seven) days.   [DISCONTINUED] atorvastatin (LIPITOR) 10 MG tablet Take 1 tablet (10 mg total) by mouth daily.   [DISCONTINUED] glipiZIDE (GLUCOTROL) 10 MG tablet Take 1 tablet (10 mg total) by mouth 2 (two) times daily.   [DISCONTINUED] glucose blood test strip Use as instructed   [DISCONTINUED] metFORMIN (GLUCOPHAGE) 1000 MG tablet Take 1 tablet (1,000 mg total) by mouth 2 (two) times daily with a meal.   [DISCONTINUED] sitaGLIPtin (JANUVIA) 50 MG tablet Take 1 tablet (50 mg total) by  mouth daily. For one week then increased to 2 tabs daily   [DISCONTINUED] triamterene-hydrochlorothiazide (DYAZIDE) 37.5-25 MG capsule TAKE 1 EACH (1 CAPSULE TOTAL) BY MOUTH DAILY.   No facility-administered encounter medications on file as of 03/30/2022.     Review of Systems  Review of Systems  Constitutional: Negative.   HENT: Negative.    Cardiovascular: Negative.   Gastrointestinal: Negative.   Allergic/Immunologic: Negative.   Neurological: Negative.   Psychiatric/Behavioral: Negative.        Physical  Exam  BP 130/74   Pulse 99   Resp 16   SpO2 100%   Wt Readings from Last 5 Encounters:  03/26/22 (!) 347 lb (157.4 kg)  04/25/21 (!) 351 lb 0.4 oz (159.2 kg)  01/21/21 (!) 357 lb (161.9 kg)  12/04/20 (!) 358 lb 14.5 oz (162.8 kg)  10/23/20 (!) 359 lb (162.8 kg)     Physical Exam Vitals and nursing note reviewed.  Constitutional:      General: She is not in acute distress.    Appearance: She is well-developed.  Cardiovascular:     Rate and Rhythm: Normal rate and regular rhythm.  Pulmonary:     Effort: Pulmonary effort is normal.     Breath sounds: Normal breath sounds.  Neurological:     Mental Status: She is alert and oriented to person, place, and time.     Lab Results:  CBC    Component Value Date/Time   WBC 9.9 12/12/2021 2341   RBC 5.89 (H) 12/12/2021 2341   HGB 12.0 12/12/2021 2341   HGB 11.9 01/21/2021 0935   HCT 38.2 12/12/2021 2341   HCT 39.8 01/21/2021 0935   PLT 418 (H) 12/12/2021 2341   PLT 506 (H) 01/21/2021 0935   MCV 64.9 (L) 12/12/2021 2341   MCV 69 (L) 01/21/2021 0935   MCH 20.4 (L) 12/12/2021 2341   MCHC 31.4 12/12/2021 2341   RDW 18.6 (H) 12/12/2021 2341   RDW 18.9 (H) 01/21/2021 0935   LYMPHSABS 4.5 (H) 12/12/2021 2341   LYMPHSABS 4.4 (H) 01/21/2021 0935   MONOABS 0.7 12/12/2021 2341   EOSABS 0.1 12/12/2021 2341   EOSABS 0.1 01/21/2021 0935   BASOSABS 0.1 12/12/2021 2341   BASOSABS 0.1 01/21/2021 0935    BMET    Component Value Date/Time   NA 131 (L) 12/12/2021 2341   NA 135 04/25/2021 1126   K 4.0 12/12/2021 2341   CL 99 12/12/2021 2341   CO2 24 12/12/2021 2341   GLUCOSE 270 (H) 12/12/2021 2341   BUN 10 12/12/2021 2341   BUN 9 04/25/2021 1126   CREATININE 0.54 12/12/2021 2341   CREATININE 0.70 04/26/2017 1133   CALCIUM 9.2 12/12/2021 2341   GFRNONAA >60 12/12/2021 2341   GFRNONAA >89 04/26/2017 1133   GFRAA >60 04/27/2020 2101   GFRAA >89 04/26/2017 1133    BNP No results found for: BNP  ProBNP    Component Value  Date/Time   PROBNP <5.0 04/14/2014 2050    Imaging: DG Wrist Complete Left  Result Date: 03/26/2022 CLINICAL DATA:  Left wrist injury EXAM: LEFT WRIST - COMPLETE 3+ VIEW COMPARISON:  None Available. FINDINGS: There is no evidence of fracture or dislocation. There is no evidence of arthropathy or other focal bone abnormality. Soft tissues are unremarkable. IMPRESSION: No acute osseous abnormality identified. Electronically Signed   By: Ofilia Neas M.D.   On: 03/26/2022 11:54   DG Shoulder Left  Result Date: 03/26/2022 CLINICAL DATA:  Left shoulder injury EXAM: LEFT  SHOULDER - 2+ VIEW COMPARISON:  None Available. FINDINGS: There is no evidence of fracture or dislocation. There is no evidence of arthropathy or other focal bone abnormality. Soft tissues are unremarkable. IMPRESSION: Negative. Electronically Signed   By: Ofilia Neas M.D.   On: 03/26/2022 11:53   DG Knee Complete 4 Views Left  Result Date: 03/26/2022 CLINICAL DATA:  Left knee injury EXAM: LEFT KNEE - COMPLETE 4+ VIEW COMPARISON:  None Available. FINDINGS: No evidence of fracture, dislocation, or joint effusion. No evidence of significant arthropathy or other focal bone abnormality. Appears to be prepatellar soft tissue wound injury. IMPRESSION: No acute osseous abnormality identified. Prepatellar soft tissue injury. Electronically Signed   By: Ofilia Neas M.D.   On: 03/26/2022 11:53     Assessment & Plan:   Type 2 diabetes mellitus without complication, without long-term current use of insulin ([place]) 1. Type 2 diabetes mellitus without complication, without long-term current use of insulin (HCC)  - atorvastatin (LIPITOR) 10 MG tablet; Take 1 tablet (10 mg total) by mouth daily.  Dispense: 90 tablet; Refill: 3 - glipiZIDE (GLUCOTROL) 10 MG tablet; Take 1 tablet (10 mg total) by mouth 2 (two) times daily.  Dispense: 180 tablet; Refill: 3 - metFORMIN (GLUCOPHAGE) 1000 MG tablet; Take 1 tablet (1,000 mg total) by  mouth 2 (two) times daily with a meal.  Dispense: 180 tablet; Refill: 3 - triamterene-hydrochlorothiazide (DYAZIDE) 37.5-25 MG capsule; TAKE 1 EACH (1 CAPSULE TOTAL) BY MOUTH DAILY.  Dispense: 90 capsule; Refill: 3 - Lipid Panel - CBC - Comprehensive metabolic panel - fluconazole (DIFLUCAN) 150 MG tablet; Take 1 tablet (150 mg total) by mouth once for 1 dose.  Dispense: 1 tablet; Refill: 0 - glucose blood test strip; Use as instructed  Dispense: 100 each; Refill: 12  2. Essential (primary) hypertension  - triamterene-hydrochlorothiazide (DYAZIDE) 37.5-25 MG capsule; TAKE 1 EACH (1 CAPSULE TOTAL) BY MOUTH DAILY.  Dispense: 90 capsule; Refill: 3   Follow up:  Follow up in 4 weeks or sooner if needed     Fenton Foy, NP 03/30/2022

## 2022-03-31 LAB — COMPREHENSIVE METABOLIC PANEL
ALT: 13 IU/L (ref 0–32)
AST: 7 IU/L (ref 0–40)
Albumin/Globulin Ratio: 1.4 (ref 1.2–2.2)
Albumin: 4.5 g/dL (ref 3.8–4.8)
Alkaline Phosphatase: 79 IU/L (ref 44–121)
BUN/Creatinine Ratio: 14 (ref 9–23)
BUN: 10 mg/dL (ref 6–20)
Bilirubin Total: <0.2 mg/dL (ref 0.0–1.2)
CO2: 19 mmol/L — ABNORMAL LOW (ref 20–29)
Calcium: 9.8 mg/dL (ref 8.7–10.2)
Chloride: 102 mmol/L (ref 96–106)
Creatinine, Ser: 0.73 mg/dL (ref 0.57–1.00)
Globulin, Total: 3.3 g/dL (ref 1.5–4.5)
Glucose: 268 mg/dL — ABNORMAL HIGH (ref 70–99)
Potassium: 3.9 mmol/L (ref 3.5–5.2)
Sodium: 136 mmol/L (ref 134–144)
Total Protein: 7.8 g/dL (ref 6.0–8.5)
eGFR: 112 mL/min/{1.73_m2} (ref >59)

## 2022-03-31 LAB — CBC
Hematocrit: 35.8 % (ref 34.0–46.6)
Hemoglobin: 11.3 g/dL (ref 11.1–15.9)
MCH: 20.8 pg — ABNORMAL LOW (ref 26.6–33.0)
MCHC: 31.6 g/dL (ref 31.5–35.7)
MCV: 66 fL — ABNORMAL LOW (ref 79–97)
Platelets: 418 10*3/uL (ref 150–450)
RBC: 5.43 x10E6/uL — ABNORMAL HIGH (ref 3.77–5.28)
RDW: 18 % — ABNORMAL HIGH (ref 11.7–15.4)
WBC: 9.9 10*3/uL (ref 3.4–10.8)

## 2022-03-31 LAB — LIPID PANEL
Chol/HDL Ratio: 4.7 ratio — ABNORMAL HIGH (ref 0.0–4.4)
Cholesterol, Total: 180 mg/dL (ref 100–199)
HDL: 38 mg/dL — ABNORMAL LOW (ref >39)
LDL Chol Calc (NIH): 90 mg/dL (ref 0–99)
Triglycerides: 312 mg/dL — ABNORMAL HIGH (ref 0–149)
VLDL Cholesterol Cal: 52 mg/dL — ABNORMAL HIGH (ref 5–40)

## 2022-04-01 ENCOUNTER — Other Ambulatory Visit: Payer: Self-pay | Admitting: Nurse Practitioner

## 2022-04-01 DIAGNOSIS — B9689 Other specified bacterial agents as the cause of diseases classified elsewhere: Secondary | ICD-10-CM

## 2022-04-01 DIAGNOSIS — E1165 Type 2 diabetes mellitus with hyperglycemia: Secondary | ICD-10-CM

## 2022-04-01 MED ORDER — METRONIDAZOLE 500 MG PO TABS: 500.0000 mg | ORAL_TABLET | Freq: Two times a day (BID) | ORAL | 0 refills | Status: DC

## 2022-04-02 ENCOUNTER — Other Ambulatory Visit: Payer: Self-pay | Admitting: Nurse Practitioner

## 2022-04-02 DIAGNOSIS — S43402A Unspecified sprain of left shoulder joint, initial encounter: Secondary | ICD-10-CM | POA: Diagnosis not present

## 2022-04-02 DIAGNOSIS — M25562 Pain in left knee: Secondary | ICD-10-CM | POA: Diagnosis not present

## 2022-04-02 LAB — NUSWAB VAGINITIS PLUS (VG+)
Candida albicans, NAA: POSITIVE — AB
Candida glabrata, NAA: NEGATIVE
Chlamydia trachomatis, NAA: NEGATIVE
Neisseria gonorrhoeae, NAA: NEGATIVE
Trich vag by NAA: NEGATIVE

## 2022-05-07 ENCOUNTER — Other Ambulatory Visit: Payer: Self-pay | Admitting: Nurse Practitioner

## 2022-05-07 DIAGNOSIS — E119 Type 2 diabetes mellitus without complications: Secondary | ICD-10-CM

## 2022-05-11 ENCOUNTER — Telehealth: Payer: Self-pay | Admitting: Nurse Practitioner

## 2022-05-11 ENCOUNTER — Other Ambulatory Visit: Payer: Self-pay | Admitting: Nurse Practitioner

## 2022-05-11 MED ORDER — FLUCONAZOLE 150 MG PO TABS: 150.0000 mg | ORAL_TABLET | Freq: Every day | ORAL | 0 refills | Status: DC

## 2022-05-11 NOTE — Telephone Encounter (Signed)
Patient has to be seen before any antibiotics are prescribed. Patient will need to schedule an appt.

## 2022-05-11 NOTE — Telephone Encounter (Signed)
Pt requesting Antibiotic for yeast infection.

## 2022-06-12 ENCOUNTER — Other Ambulatory Visit: Payer: Self-pay | Admitting: Nurse Practitioner

## 2022-06-12 ENCOUNTER — Telehealth: Payer: Self-pay | Admitting: Nurse Practitioner

## 2022-06-12 ENCOUNTER — Ambulatory Visit (INDEPENDENT_AMBULATORY_CARE_PROVIDER_SITE_OTHER): Payer: Federal, State, Local not specified - PPO | Admitting: Nurse Practitioner

## 2022-06-12 DIAGNOSIS — L299 Pruritus, unspecified: Secondary | ICD-10-CM | POA: Diagnosis not present

## 2022-06-12 DIAGNOSIS — N898 Other specified noninflammatory disorders of vagina: Secondary | ICD-10-CM | POA: Diagnosis not present

## 2022-06-12 DIAGNOSIS — E119 Type 2 diabetes mellitus without complications: Secondary | ICD-10-CM

## 2022-06-12 LAB — POCT URINALYSIS DIP (CLINITEK)
Bilirubin, UA: NEGATIVE
Glucose, UA: NEGATIVE mg/dL
Ketones, POC UA: NEGATIVE mg/dL
Leukocytes, UA: NEGATIVE
Nitrite, UA: NEGATIVE
POC PROTEIN,UA: 100 — AB
Spec Grav, UA: >=1.03 — AB (ref 1.010–1.025)
Urobilinogen, UA: 0.2 E.U./dL
pH, UA: 6 (ref 5.0–8.0)

## 2022-06-12 MED ORDER — BLOOD GLUCOSE MONITOR KIT: PACK | 0 refills | Status: DC

## 2022-06-12 NOTE — Telephone Encounter (Signed)
Pt called stating she misplaced her blood glucose monitor kit and is requesting a new prescription be sent over.

## 2022-06-14 LAB — URINE CULTURE

## 2022-06-17 ENCOUNTER — Other Ambulatory Visit: Payer: Self-pay | Admitting: Nurse Practitioner

## 2022-06-17 LAB — NUSWAB VAGINITIS PLUS (VG+)
Candida albicans, NAA: POSITIVE — AB
Candida glabrata, NAA: NEGATIVE
Chlamydia trachomatis, NAA: NEGATIVE
Neisseria gonorrhoeae, NAA: NEGATIVE
Trich vag by NAA: NEGATIVE

## 2022-06-28 ENCOUNTER — Other Ambulatory Visit: Payer: Self-pay

## 2022-06-28 DIAGNOSIS — L292 Pruritus vulvae: Secondary | ICD-10-CM | POA: Diagnosis not present

## 2022-06-28 DIAGNOSIS — R739 Hyperglycemia, unspecified: Secondary | ICD-10-CM | POA: Diagnosis not present

## 2022-06-28 DIAGNOSIS — E1165 Type 2 diabetes mellitus with hyperglycemia: Secondary | ICD-10-CM | POA: Diagnosis not present

## 2022-06-28 DIAGNOSIS — B379 Candidiasis, unspecified: Secondary | ICD-10-CM | POA: Insufficient documentation

## 2022-06-28 DIAGNOSIS — K047 Periapical abscess without sinus: Secondary | ICD-10-CM | POA: Insufficient documentation

## 2022-06-29 ENCOUNTER — Encounter (HOSPITAL_BASED_OUTPATIENT_CLINIC_OR_DEPARTMENT_OTHER): Payer: Self-pay | Admitting: Emergency Medicine

## 2022-06-29 ENCOUNTER — Other Ambulatory Visit: Payer: Self-pay

## 2022-06-29 ENCOUNTER — Emergency Department (HOSPITAL_BASED_OUTPATIENT_CLINIC_OR_DEPARTMENT_OTHER)
Admission: EM | Admit: 2022-06-29 | Discharge: 2022-06-29 | Disposition: A | Discharge status: Home / Self Care | Payer: Federal, State, Local not specified - PPO | Attending: Emergency Medicine | Admitting: Emergency Medicine

## 2022-06-29 DIAGNOSIS — K047 Periapical abscess without sinus: Secondary | ICD-10-CM

## 2022-06-29 DIAGNOSIS — R739 Hyperglycemia, unspecified: Secondary | ICD-10-CM

## 2022-06-29 DIAGNOSIS — B379 Candidiasis, unspecified: Secondary | ICD-10-CM

## 2022-06-29 LAB — URINALYSIS, MICROSCOPIC (REFLEX)

## 2022-06-29 LAB — PREGNANCY, URINE: Preg Test, Ur: NEGATIVE

## 2022-06-29 LAB — URINALYSIS, ROUTINE W REFLEX MICROSCOPIC
Bilirubin Urine: NEGATIVE
Glucose, UA: >=500 mg/dL — AB
Hgb urine dipstick: NEGATIVE
Ketones, ur: NEGATIVE mg/dL
Leukocytes,Ua: NEGATIVE
Nitrite: NEGATIVE
Protein, ur: NEGATIVE mg/dL
Specific Gravity, Urine: 1.015 (ref 1.005–1.030)
pH: 5.5 (ref 5.0–8.0)

## 2022-06-29 LAB — CBG MONITORING, ED: Glucose-Capillary: 289 mg/dL — ABNORMAL HIGH (ref 70–99)

## 2022-06-29 MED ORDER — AMOXICILLIN-POT CLAVULANATE 875-125 MG PO TABS
1.0000 | ORAL_TABLET | Freq: Once | ORAL | Status: AC
  Administered 2022-06-29: 1 via ORAL
  Filled 2022-06-29: qty 1

## 2022-06-29 MED ORDER — AMOXICILLIN-POT CLAVULANATE 875-125 MG PO TABS: 1.0000 | ORAL_TABLET | Freq: Two times a day (BID) | ORAL | 0 refills | Status: DC

## 2022-06-29 MED ORDER — FLUCONAZOLE 150 MG PO TABS
150.0000 mg | ORAL_TABLET | Freq: Once | ORAL | Status: AC
  Administered 2022-06-29: 150 mg via ORAL
  Filled 2022-06-29: qty 1

## 2022-06-29 NOTE — ED Triage Notes (Signed)
Pt c/o vaginal itching and burning x 2 weeks. No concern for STI.  Also reports dental pain x L sided dental pain. No facial swelling, but states some swelling to gums. Known cracked tooth. Does have a dentist. Pt also concerned about her blood sugar. CBG 289

## 2022-06-29 NOTE — ED Provider Notes (Signed)
[facility]  Provider Note  CSN: [id number] Arrival date & time: 06/28/22 2348  History Chief Complaint  Patient presents with   Vaginal Itching    Grace Daniels is a 31 y.o. female here with two complaints. First is left upper toothache, ongoing for a few days. No drainage. Has a dentist.   She also reports 3 days of vaginal itching and burning. No rash or discharge. No concern for STI. Her glucose has also been elevated.    Home Medications Prior to Admission medications   Medication Sig Start Date End Date Taking? Authorizing Provider  amoxicillin-clavulanate (AUGMENTIN) 875-125 MG tablet Take 1 tablet by mouth every 12 (twelve) hours. 06/29/22  Yes Truddie Hidden, MD  albuterol (VENTOLIN HFA) 108 (90 Base) MCG/ACT inhaler Inhale 1-2 puffs into the lungs every 6 (six) hours as needed for wheezing or shortness of breath. 12/04/20   Palumbo, April, MD  atorvastatin (LIPITOR) 10 MG tablet Take 1 tablet (10 mg total) by mouth daily. 03/30/22   Fenton Foy, NP  blood glucose meter kit and supplies KIT Dispense based on patient and insurance preference. Use up to four times daily as directed. (FOR ICD-9 250.00, 250.01). 06/12/22   Fenton Foy, NP  Blood Glucose Monitoring Suppl [facility] BLOOD GLUCOSE METER) w/Device KIT 1 applicator by Does not apply route 4 (four) times daily -  before meals and at bedtime. 05/16/18   Azzie Glatter, FNP  Blood Pressure Monitoring (BLOOD PRESSURE MONITOR AUTOMAT) DEVI 1 kit by Does not apply route daily. 04/25/21   Vevelyn Francois, NP  diclofenac (VOLTAREN) 75 MG EC tablet Take 1 tablet (75 mg total) by mouth 2 (two) times daily. 03/28/21   Wallene Huh, DPM  fluconazole (DIFLUCAN) 150 MG tablet Take 1 tablet (150 mg total) by mouth daily. May take 1 tablet today and repeat in 3 days 05/11/22   Fenton Foy, NP  glipiZIDE (GLUCOTROL) 10 MG tablet Take 1 tablet (10 mg total) by mouth 2 (two) times daily. 03/30/22    Fenton Foy, NP  glucose blood test strip Use as instructed 03/30/22   Fenton Foy, NP  hydrOXYzine (ATARAX/VISTARIL) 10 MG tablet Take 1 tablet (10 mg total) by mouth 3 (three) times daily as needed for itching. 04/25/21   Vevelyn Francois, NP  metFORMIN (GLUCOPHAGE) 1000 MG tablet Take 1 tablet (1,000 mg total) by mouth 2 (two) times daily with a meal. 03/30/22 03/30/23  Fenton Foy, NP  triamterene-hydrochlorothiazide (DYAZIDE) 37.5-25 MG capsule TAKE 1 EACH (1 CAPSULE TOTAL) BY MOUTH DAILY. 03/30/22   Fenton Foy, NP  Vitamin D, Ergocalciferol, (DRISDOL) 1.25 MG (50000 UNIT) CAPS capsule Take 1 capsule (50,000 Units total) by mouth every 7 (seven) days. 01/22/21   Azzie Glatter, FNP     Allergies    Patient has no known allergies.   Review of Systems   Review of Systems Please see HPI for pertinent positives and negatives  Physical Exam BP (!) 142/102 (BP Location: Right Arm)   Pulse (!) 101   Temp 98.8 F (37.1 C) (Oral)   Resp 20   Ht 5' 10" (1.778 m)   Wt (!) 154.2 kg   LMP 05/19/2022   SpO2 97%   BMI 48.78 kg/m   Physical Exam Vitals and nursing note reviewed.  Constitutional:      Appearance: Normal appearance.  HENT:     Head: Normocephalic and atraumatic.  Nose: Nose normal.     Mouth/Throat:     Mouth: Mucous membranes are moist.     Comments: Mild tenderness erythema and induration to gingiva above L upper 2nd molar, no fluctuance Eyes:     Extraocular Movements: Extraocular movements intact.     Conjunctiva/sclera: Conjunctivae normal.  Cardiovascular:     Rate and Rhythm: Normal rate.  Pulmonary:     Effort: Pulmonary effort is normal.     Breath sounds: Normal breath sounds.  Abdominal:     General: Abdomen is flat.     Palpations: Abdomen is soft.     Tenderness: There is no abdominal tenderness.  Musculoskeletal:        General: No swelling. Normal range of motion.     Cervical back: Neck supple.  Skin:    General: Skin is  warm and dry.  Neurological:     General: No focal deficit present.     Mental Status: She is alert.  Psychiatric:        Mood and Affect: Mood normal.     ED Results / Procedures / Treatments   EKG None  Procedures Procedures  Medications Ordered in the ED Medications  fluconazole (DIFLUCAN) tablet 150 mg (has no administration in time range)  amoxicillin-clavulanate (AUGMENTIN) 875-125 MG per tablet 1 tablet (has no administration in time range)    Initial Impression and Plan  Patient here with toothache, will need Rx for Augmentin. Will check pelvic exam for swabs as well.   ED Course   Clinical Course as of 06/29/22 0211  Mon Jun 29, 2022  0151 HCG is negative.  [CS]  0208 UA does not have signs of UTI but does have yeast. She would like to defer pelvic as it is unlikely to change plan at this point. Will treat yeast infection with diflucan. Recommend she see PCP for better glucose control. Dental follow up for her tooth.  [CS]    Clinical Course User Index [CS] Sheldon, Charles B, MD     MDM Rules/Calculators/A&P Medical Decision Making Problems Addressed: Apical abscess: acute illness or injury Hyperglycemia: chronic illness or injury Yeast infection: acute illness or injury  Amount and/or Complexity of Data Reviewed Labs: ordered. Decision-making details documented in ED Course.  Risk Prescription drug management.    Final Clinical Impression(s) / ED Diagnoses Final diagnoses:  Apical abscess  Yeast infection  Hyperglycemia    Rx / DC Orders ED Discharge Orders          Ordered    amoxicillin-clavulanate (AUGMENTIN) 875-125 MG tablet  Every 12 hours        06/29/22 0211             Sheldon, Charles B, MD 06/29/22 0211  

## 2022-07-02 ENCOUNTER — Encounter: Payer: Self-pay | Admitting: Nurse Practitioner

## 2022-07-02 ENCOUNTER — Ambulatory Visit (INDEPENDENT_AMBULATORY_CARE_PROVIDER_SITE_OTHER): Payer: Federal, State, Local not specified - PPO | Admitting: Nurse Practitioner

## 2022-07-02 VITALS — BP 154/99 | HR 100 | Temp 97.5°F

## 2022-07-02 DIAGNOSIS — G4730 Sleep apnea, unspecified: Secondary | ICD-10-CM | POA: Diagnosis not present

## 2022-07-02 DIAGNOSIS — E119 Type 2 diabetes mellitus without complications: Secondary | ICD-10-CM | POA: Diagnosis not present

## 2022-07-02 LAB — POCT GLYCOSYLATED HEMOGLOBIN (HGB A1C)
HbA1c POC (<> result, manual entry): 10.9 % (ref 4.0–5.6)
HbA1c, POC (controlled diabetic range): 10.9 % — AB (ref 0.0–7.0)
HbA1c, POC (prediabetic range): 10.9 % — AB (ref 5.7–6.4)
Hemoglobin A1C: 10.9 % — AB (ref 4.0–5.6)

## 2022-07-02 MED ORDER — METFORMIN HCL 1000 MG PO TABS: 1000.0000 mg | ORAL_TABLET | Freq: Two times a day (BID) | ORAL | 3 refills | Status: DC

## 2022-07-02 MED ORDER — FLUCONAZOLE 150 MG PO TABS: 150.0000 mg | ORAL_TABLET | Freq: Every day | ORAL | 0 refills | Status: DC

## 2022-07-02 MED ORDER — GLIPIZIDE 10 MG PO TABS: 10.0000 mg | ORAL_TABLET | Freq: Two times a day (BID) | ORAL | 3 refills | Status: DC

## 2022-07-02 NOTE — Progress Notes (Signed)
_0  ID: Grace Daniels, female    DOB: [birth year], 31 y.o.   MRN: [id number]  Chief Complaint  Patient presents with   Follow-up    Pt is here for 3 month DM, pt is requesting refill on fluconazole (DIFLUCAN pt would like new CPAP machine    Blood Pressure Monitoring (B      Referring provider: Fenton Foy, NP  HPI  Grace Daniels presents for follow up. A former patient of NP Stroud. She  has a past medical history of Coronavirus infection (11/2020), Diabetes ([place]), Hyperlipidemia, Hypertension, Morbid obesity ([place]), Right knee pain (05/2020), Right thigh pain (05/2020), Sleep apnea, and Vitamin D deficiency (01/2021).   Patient presents today for diabetes follow up. Patient has been noncompliant with diabetic medications. Her A1C and blood pressure are elevated in office today. Discussed the importance of medication compliance. Patient is requesting diflucan - she is currently on amoxicillin for dental issue.  Patient is requesting CPAP machine - it has been 12 years since last sleep study. Will refer to sleep medicine.   Denies f/c/s, n/v/d, hemoptysis, PND, leg swelling Denies chest pain or edema    No Known Allergies  Immunization History  Administered Date(s) Administered   Tdap 03/26/2022    Past Medical History:  Diagnosis Date   Coronavirus infection 11/2020   Diabetes ([place])    Hyperlipidemia    Hypertension    Morbid obesity ([place])    Right knee pain 05/2020   Right thigh pain 05/2020   Sleep apnea    Vitamin D deficiency 01/2021    Tobacco History: Social History   Tobacco Use  Smoking Status Never  Smokeless Tobacco Never   Counseling given: Not Answered   Outpatient Encounter Medications as of 07/02/2022  Medication Sig   amoxicillin-clavulanate (AUGMENTIN) 875-125 MG tablet Take 1 tablet by mouth every 12 (twelve) hours.   atorvastatin (LIPITOR) 10 MG tablet Take 1 tablet (10 mg total) by mouth daily.   fluconazole (DIFLUCAN) 150 MG  tablet Take 1 tablet (150 mg total) by mouth daily. May take 1 tablet today and repeat in 3 days   fluconazole (DIFLUCAN) 150 MG tablet Take 1 tablet (150 mg total) by mouth daily.   glucose blood test strip Use as instructed   triamterene-hydrochlorothiazide (DYAZIDE) 37.5-25 MG capsule TAKE 1 EACH (1 CAPSULE TOTAL) BY MOUTH DAILY.   Vitamin D, Ergocalciferol, (DRISDOL) 1.25 MG (50000 UNIT) CAPS capsule Take 1 capsule (50,000 Units total) by mouth every 7 (seven) days.   [DISCONTINUED] glipiZIDE (GLUCOTROL) 10 MG tablet Take 1 tablet (10 mg total) by mouth 2 (two) times daily.   [DISCONTINUED] metFORMIN (GLUCOPHAGE) 1000 MG tablet Take 1 tablet (1,000 mg total) by mouth 2 (two) times daily with a meal.   albuterol (VENTOLIN HFA) 108 (90 Base) MCG/ACT inhaler Inhale 1-2 puffs into the lungs every 6 (six) hours as needed for wheezing or shortness of breath. (Patient not taking: Reported on 07/02/2022)   blood glucose meter kit and supplies KIT Dispense based on patient and insurance preference. Use up to four times daily as directed. (FOR ICD-9 250.00, 250.01).   Blood Glucose Monitoring Suppl (ACURA BLOOD GLUCOSE METER) w/Device KIT 1 applicator by Does not apply route 4 (four) times daily -  before meals and at bedtime.   Blood Pressure Monitoring (BLOOD PRESSURE MONITOR AUTOMAT) DEVI 1 kit by Does not apply route daily.   diclofenac (VOLTAREN) 75 MG EC tablet Take 1 tablet (75 mg total) by mouth 2 (  two) times daily.   glipiZIDE (GLUCOTROL) 10 MG tablet Take 1 tablet (10 mg total) by mouth 2 (two) times daily.   hydrOXYzine (ATARAX/VISTARIL) 10 MG tablet Take 1 tablet (10 mg total) by mouth 3 (three) times daily as needed for itching. (Patient not taking: Reported on 07/02/2022)   metFORMIN (GLUCOPHAGE) 1000 MG tablet Take 1 tablet (1,000 mg total) by mouth 2 (two) times daily with a meal.   No facility-administered encounter medications on file as of 07/02/2022.     Review of Systems  Review of  Systems  Constitutional: Negative.   HENT: Negative.    Cardiovascular: Negative.   Gastrointestinal: Negative.   Allergic/Immunologic: Negative.   Neurological: Negative.   Psychiatric/Behavioral: Negative.         Physical Exam  BP (!) 154/99 (BP Location: Left Arm, Patient Position: Sitting, Cuff Size: Large)   Pulse 100   Temp (!) 97.5 F (36.4 C)   LMP 05/19/2022   SpO2 97%   Wt Readings from Last 5 Encounters:  06/29/22 (!) 340 lb (154.2 kg)  03/26/22 (!) 347 lb (157.4 kg)  04/25/21 (!) 351 lb 0.4 oz (159.2 kg)  01/21/21 (!) 357 lb (161.9 kg)  12/04/20 (!) 358 lb 14.5 oz (162.8 kg)     Physical Exam Vitals and nursing note reviewed.  Constitutional:      General: She is not in acute distress.    Appearance: She is well-developed.  Cardiovascular:     Rate and Rhythm: Normal rate and regular rhythm.  Pulmonary:     Effort: Pulmonary effort is normal.     Breath sounds: Normal breath sounds.  Neurological:     Mental Status: She is alert and oriented to person, place, and time.      Lab Results:  CBC    Component Value Date/Time   WBC 9.9 03/30/2022 1558   WBC 9.9 12/12/2021 2341   RBC 5.43 (H) 03/30/2022 1558   RBC 5.89 (H) 12/12/2021 2341   HGB 11.3 03/30/2022 1558   HCT 35.8 03/30/2022 1558   PLT 418 03/30/2022 1558   MCV 66 (L) 03/30/2022 1558   MCH 20.8 (L) 03/30/2022 1558   MCH 20.4 (L) 12/12/2021 2341   MCHC 31.6 03/30/2022 1558   MCHC 31.4 12/12/2021 2341   RDW 18.0 (H) 03/30/2022 1558   LYMPHSABS 4.5 (H) 12/12/2021 2341   LYMPHSABS 4.4 (H) 01/21/2021 0935   MONOABS 0.7 12/12/2021 2341   EOSABS 0.1 12/12/2021 2341   EOSABS 0.1 01/21/2021 0935   BASOSABS 0.1 12/12/2021 2341   BASOSABS 0.1 01/21/2021 0935    BMET    Component Value Date/Time   NA 136 03/30/2022 1558   K 3.9 03/30/2022 1558   CL 102 03/30/2022 1558   CO2 19 (L) 03/30/2022 1558   GLUCOSE 268 (H) 03/30/2022 1558   GLUCOSE 270 (H) 12/12/2021 2341   BUN 10  03/30/2022 1558   CREATININE 0.73 03/30/2022 1558   CREATININE 0.70 04/26/2017 1133   CALCIUM 9.8 03/30/2022 1558   GFRNONAA >60 12/12/2021 2341   GFRNONAA >89 04/26/2017 1133   GFRAA >60 04/27/2020 2101   GFRAA >89 04/26/2017 1133    BNP No results found for: "BNP"  ProBNP    Component Value Date/Time   PROBNP <5.0 04/14/2014 2050    Imaging: No results found.   Assessment & Plan:   Sleep apnea - Ambulatory referral to Pulmonology - POCT glycosylated hemoglobin (Hb A1C)    2. Type 2 diabetes mellitus without complication,  without long-term current use of insulin (HCC)   Lab Results  Component Value Date   HGBA1C 10.9 (A) 07/02/2022   HGBA1C 10.9 07/02/2022   HGBA1C 10.9 (A) 07/02/2022   HGBA1C 10.9 (A) 07/02/2022     - POCT glycosylated hemoglobin (Hb A1C) - Ambulatory referral to diabetic education - metFORMIN (GLUCOPHAGE) 1000 MG tablet; Take 1 tablet (1,000 mg total) by mouth 2 (two) times daily with a meal.  Dispense: 180 tablet; Refill: 3 - glipiZIDE (GLUCOTROL) 10 MG tablet; Take 1 tablet (10 mg total) by mouth 2 (two) times daily.  Dispense: 180 tablet; Refill: 3  3. Hypertension:  Advised to take medications and follow strict low salt diet  AVS was discussed with patient. Patient was asked if there were any other questions or concerns today. Patient stated there were no other questions or concerns. Patient was advised that they needed lab work and then could check out and schedule follow up visit. Patient voiced understanding. Appointment was completed.     Follow up:  Follow up in 3 months or sooner if needed     Fenton Foy, NP 07/02/2022

## 2022-07-02 NOTE — Assessment & Plan Note (Signed)
-   Ambulatory referral to Pulmonology - POCT glycosylated hemoglobin (Hb A1C)    2. Type 2 diabetes mellitus without complication, without long-term current use of insulin (HCC)   Lab Results  Component Value Date   HGBA1C 10.9 (A) 07/02/2022   HGBA1C 10.9 07/02/2022   HGBA1C 10.9 (A) 07/02/2022   HGBA1C 10.9 (A) 07/02/2022     - POCT glycosylated hemoglobin (Hb A1C) - Ambulatory referral to diabetic education - metFORMIN (GLUCOPHAGE) 1000 MG tablet; Take 1 tablet (1,000 mg total) by mouth 2 (two) times daily with a meal.  Dispense: 180 tablet; Refill: 3 - glipiZIDE (GLUCOTROL) 10 MG tablet; Take 1 tablet (10 mg total) by mouth 2 (two) times daily.  Dispense: 180 tablet; Refill: 3  3. Hypertension:  Advised to take medications and follow strict low salt diet  AVS was discussed with patient. Patient was asked if there were any other questions or concerns today. Patient stated there were no other questions or concerns. Patient was advised that they needed lab work and then could check out and schedule follow up visit. Patient voiced understanding. Appointment was completed.     Follow up:  Follow up in 3 months or sooner if needed

## 2022-07-02 NOTE — Patient Instructions (Addendum)
1. Sleep apnea, unspecified type  - Ambulatory referral to Pulmonology - POCT glycosylated hemoglobin (Hb A1C)    2. Type 2 diabetes mellitus without complication, without long-term current use of insulin (HCC)   Lab Results  Component Value Date   HGBA1C 10.9 (A) 07/02/2022   HGBA1C 10.9 07/02/2022   HGBA1C 10.9 (A) 07/02/2022   HGBA1C 10.9 (A) 07/02/2022     - POCT glycosylated hemoglobin (Hb A1C) - Ambulatory referral to diabetic education - metFORMIN (GLUCOPHAGE) 1000 MG tablet; Take 1 tablet (1,000 mg total) by mouth 2 (two) times daily with a meal.  Dispense: 180 tablet; Refill: 3 - glipiZIDE (GLUCOTROL) 10 MG tablet; Take 1 tablet (10 mg total) by mouth 2 (two) times daily.  Dispense: 180 tablet; Refill: 3  3. Hypertension:  Advised to take medications and follow strict low salt diet  AVS was discussed with patient. Patient was asked if there were any other questions or concerns today. Patient stated there were no other questions or concerns. Patient was advised that they needed lab work and then could check out and schedule follow up visit. Patient voiced understanding. Appointment was completed.     Follow up:  Follow up in 3 months or sooner if needed    Diabetes Mellitus and Nutrition, Adult  When you have diabetes, or diabetes mellitus, it is very important to have healthy eating habits because your blood sugar (glucose) levels are greatly affected by what you eat and drink. Eating healthy foods in the right amounts, at about the same times every day, can help you: Manage your blood glucose. Lower your risk of heart disease. Improve your blood pressure. Reach or maintain a healthy weight. What can affect my meal plan? Every person with diabetes is different, and each person has different needs for a meal plan. Your health care provider may recommend that you work with a dietitian to make a meal plan that is best for you. Your meal plan may vary depending on  factors such as: The calories you need. The medicines you take. Your weight. Your blood glucose, blood pressure, and cholesterol levels. Your activity level. Other health conditions you have, such as heart or kidney disease. How do carbohydrates affect me? Carbohydrates, also called carbs, affect your blood glucose level more than any other type of food. Eating carbs raises the amount of glucose in your blood. It is important to know how many carbs you can safely have in each meal. This is different for every person. Your dietitian can help you calculate how many carbs you should have at each meal and for each snack. How does alcohol affect me? Alcohol can cause a decrease in blood glucose (hypoglycemia), especially if you use insulin or take certain diabetes medicines by mouth. Hypoglycemia can be a life-threatening condition. Symptoms of hypoglycemia, such as sleepiness, dizziness, and confusion, are similar to symptoms of having too much alcohol. Do not drink alcohol if: Your health care provider tells you not to drink. You are pregnant, may be pregnant, or are planning to become pregnant. If you drink alcohol: Limit how much you have to: 0-1 drink a day for women. 0-2 drinks a day for men. Know how much alcohol is in your drink. In the [place] one drink equals one 12 oz bottle of beer (355 mL), one 5 oz glass of wine (148 mL), or one 1 oz glass of hard liquor (44 mL). Keep yourself hydrated with water, diet soda, or unsweetened iced tea. Keep in mind  that regular soda, juice, and other mixers may contain a lot of sugar and must be counted as carbs. What are tips for following this plan?  Reading food labels Start by checking the serving size on the Nutrition Facts label of packaged foods and drinks. The number of calories and the amount of carbs, fats, and other nutrients listed on the label are based on one serving of the item. Many items contain more than one serving per package. Check  the total grams (g) of carbs in one serving. Check the number of grams of saturated fats and trans fats in one serving. Choose foods that have a low amount or none of these fats. Check the number of milligrams (mg) of salt (sodium) in one serving. Most people should limit total sodium intake to less than 2,300 mg per day. Always check the nutrition information of foods labeled as "low-fat" or "nonfat." These foods may be higher in added sugar or refined carbs and should be avoided. Talk to your dietitian to identify your daily goals for nutrients listed on the label. Shopping Avoid buying canned, pre-made, or processed foods. These foods tend to be high in fat, sodium, and added sugar. Shop around the outside edge of the grocery store. This is where you will most often find fresh fruits and vegetables, bulk grains, fresh meats, and fresh dairy products. Cooking Use low-heat cooking methods, such as baking, instead of high-heat cooking methods, such as deep frying. Cook using healthy oils, such as olive, canola, or sunflower oil. Avoid cooking with butter, cream, or high-fat meats. Meal planning Eat meals and snacks regularly, preferably at the same times every day. Avoid going long periods of time without eating. Eat foods that are high in fiber, such as fresh fruits, vegetables, beans, and whole grains. Eat 4-6 oz (112-168 g) of lean protein each day, such as lean meat, chicken, fish, eggs, or tofu. One ounce (oz) (28 g) of lean protein is equal to: 1 oz (28 g) of meat, chicken, or fish. 1 egg.  cup (62 g) of tofu. Eat some foods each day that contain healthy fats, such as avocado, nuts, seeds, and fish. What foods should I eat? Fruits Berries. Apples. Oranges. Peaches. Apricots. Plums. Grapes. Mangoes. Papayas. Pomegranates. Kiwi. Cherries. Vegetables Leafy greens, including lettuce, spinach, kale, chard, collard greens, mustard greens, and cabbage. Beets. Cauliflower. Broccoli. Carrots.  Green beans. Tomatoes. Peppers. Onions. Cucumbers. Brussels sprouts. Grains Whole grains, such as whole-wheat or whole-grain bread, crackers, tortillas, cereal, and pasta. Unsweetened oatmeal. Quinoa. Brown or wild rice. Meats and other proteins Seafood. Poultry without skin. Lean cuts of poultry and beef. Tofu. Nuts. Seeds. Dairy Low-fat or fat-free dairy products such as milk, yogurt, and cheese. The items listed above may not be a complete list of foods and beverages you can eat and drink. Contact a dietitian for more information. What foods should I avoid? Fruits Fruits canned with syrup. Vegetables Canned vegetables. Frozen vegetables with butter or cream sauce. Grains Refined white flour and flour products such as bread, pasta, snack foods, and cereals. Avoid all processed foods. Meats and other proteins Fatty cuts of meat. Poultry with skin. Breaded or fried meats. Processed meat. Avoid saturated fats. Dairy Full-fat yogurt, cheese, or milk. Beverages Sweetened drinks, such as soda or iced tea. The items listed above may not be a complete list of foods and beverages you should avoid. Contact a dietitian for more information. Questions to ask a health care provider Do I need to meet  with a certified diabetes care and education specialist? Do I need to meet with a dietitian? What number can I call if I have questions? When are the best times to check my blood glucose? Where to find more information: American Diabetes Association: diabetes.org Academy of Nutrition and Dietetics: eatright.[employer] of Diabetes and Digestive and Kidney Diseases: StageSync.si Association of Diabetes Care & Education Specialists: diabeteseducator.org Summary It is important to have healthy eating habits because your blood sugar (glucose) levels are greatly affected by what you eat and drink. It is important to use alcohol carefully. A healthy meal plan will help you manage your blood  glucose and lower your risk of heart disease. Your health care provider may recommend that you work with a dietitian to make a meal plan that is best for you. This information is not intended to replace advice given to you by your health care provider. Make sure you discuss any questions you have with your health care provider. Document Revised: 06/05/2020 Document Reviewed: 06/05/2020 Elsevier Patient Education  2023 [employer].

## 2022-07-23 ENCOUNTER — Encounter: Payer: Self-pay | Admitting: Adult Health

## 2022-07-23 ENCOUNTER — Other Ambulatory Visit: Payer: Self-pay | Admitting: Nurse Practitioner

## 2022-07-23 ENCOUNTER — Telehealth: Payer: Self-pay | Admitting: Adult Health

## 2022-07-23 ENCOUNTER — Ambulatory Visit (INDEPENDENT_AMBULATORY_CARE_PROVIDER_SITE_OTHER): Payer: Federal, State, Local not specified - PPO | Admitting: Adult Health

## 2022-07-23 VITALS — BP 136/90 | HR 84 | Temp 98.1°F | Ht 70.0 in | Wt 353.8 lb

## 2022-07-23 DIAGNOSIS — G4733 Obstructive sleep apnea (adult) (pediatric): Secondary | ICD-10-CM | POA: Diagnosis not present

## 2022-07-23 NOTE — Progress Notes (Signed)
_0  ID: Grace Daniels, female    DOB: 1991-05-09, 31 y.o.   MRN: [id number]  Chief Complaint  Patient presents with   Consult    Referring provider: Fenton Foy, NP  HPI: 31 year old female seen for sleep consult July 23, 2022 to reestablish for sleep apnea.  Former patient of Dr. Janee Morn last seen September 2018 Dx with OSA ~2008 at [facility]   TEST/EVENTS :  Home sleep study August 30, 2017 showed very severe sleep apnea with AHI 86.8/hour and SPO2 low at 64%.  07/23/2022 Sleep consult  Patient presents for sleep consult today.  Kindly referred by primary care provider Lazaro Arms, NP. Patient is here today to reestablish for sleep apnea.  Patient was formally seen by Dr. Annamaria Boots for sleep consult in September 2018.  Recommend to restart CPAP.  Patient says she was originally diagnosed with sleep apnea around 2018 by [facility] physicians.   Says she has been on and off of CPAP for many years.  She says she has a relatively new CPAP machine about 32 years old.  She has run out of supplies.  Has not worn her CPAP for the last few months.  Says she feels much better when she wears her CPAP. Previous home sleep study 2019 showed very severe sleep apnea with AHI of 86.8/hour SPO2 low at 64%. Patient typically goes to sleep about 12 AM to 2 AM.  She works second shift.  Takes only about 20 minutes to go to sleep.  Is up multiple times throughout the night.  Sleep is very restless.  Gets up about 9 AM.  Weight is down about 45 pounds current weight is at 253 with a BMI of 50.  No symptoms suspicious for complex or sleep process.  Patient says that she snores has restless sleep feels very tired and gasp for air during her sleep.  Also has headaches.  Caffeine intake is about 2 sodas daily.  Has no history of congestive heart failure or stroke.  No removable dental work.  Patient says that she is claustrophobic.  She does not feel good and a full facemask.  Can only use a nasal mask. Epworth  score is 8 out of 24.  Gets sleepy if she is watching TV , after eating lunch and in the afternoon hours. Patient is requesting new supplies for her CPAP.  Patient says she wants to restart CPAP so she can feel better and have less daytime sleepiness.  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  Lives alone.  Does not have children.  Is single.  Works second shift.  Never smoker.  No alcohol or drugs.  Family history diabetes and hypertension.  No Known Allergies  Immunization History  Administered Date(s) Administered   Tdap 03/26/2022    Past Medical History:  Diagnosis Date   Coronavirus infection 11/2020   Diabetes ([place])    Hyperlipidemia    Hypertension    Morbid obesity ([place])    Right knee pain 05/2020   Right thigh pain 05/2020   Sleep apnea    Vitamin D deficiency 01/2021    Tobacco History: Social History   Tobacco Use  Smoking Status Never   Passive exposure: Never  Smokeless Tobacco Never   Counseling given: Not Answered   Outpatient Medications Prior to Visit  Medication Sig Dispense Refill   albuterol (VENTOLIN HFA) 108 (90 Base) MCG/ACT inhaler Inhale  1-2 puffs into the lungs every 6 (six) hours as needed for wheezing or shortness of breath. 1 each 0   atorvastatin (LIPITOR) 10 MG tablet Take 1 tablet (10 mg total) by mouth daily. 90 tablet 3   blood glucose meter kit and supplies KIT Dispense based on patient and insurance preference. Use up to four times daily as directed. (FOR ICD-9 250.00, 250.01). 1 each 0   Blood Glucose Monitoring Suppl (ACURA BLOOD GLUCOSE METER) w/Device KIT 1 applicator by Does not apply route 4 (four) times daily -  before meals and at bedtime. 1 kit 1   Blood Pressure Monitoring (BLOOD PRESSURE MONITOR AUTOMAT) DEVI 1 kit by Does not apply route daily. 1 each 0   diclofenac (VOLTAREN) 75 MG EC tablet Take 1 tablet (75  mg total) by mouth 2 (two) times daily. 50 tablet 2   fluconazole (DIFLUCAN) 150 MG tablet Take 1 tablet (150 mg total) by mouth daily. May take 1 tablet today and repeat in 3 days 2 tablet 0   metFORMIN (GLUCOPHAGE) 1000 MG tablet Take 1 tablet (1,000 mg total) by mouth 2 (two) times daily with a meal. 180 tablet 3   triamterene-hydrochlorothiazide (DYAZIDE) 37.5-25 MG capsule TAKE 1 EACH (1 CAPSULE TOTAL) BY MOUTH DAILY. 90 capsule 3   Vitamin D, Ergocalciferol, (DRISDOL) 1.25 MG (50000 UNIT) CAPS capsule Take 1 capsule (50,000 Units total) by mouth every 7 (seven) days. 5 capsule 6   glipiZIDE (GLUCOTROL) 10 MG tablet Take 1 tablet (10 mg total) by mouth 2 (two) times daily. 180 tablet 3   glucose blood test strip Use as instructed 100 each 12   amoxicillin-clavulanate (AUGMENTIN) 875-125 MG tablet Take 1 tablet by mouth every 12 (twelve) hours. 14 tablet 0   fluconazole (DIFLUCAN) 150 MG tablet Take 1 tablet (150 mg total) by mouth daily. 1 tablet 0   hydrOXYzine (ATARAX/VISTARIL) 10 MG tablet Take 1 tablet (10 mg total) by mouth 3 (three) times daily as needed for itching. (Patient not taking: Reported on 07/02/2022) 90 tablet 2   No facility-administered medications prior to visit.     Review of Systems:   Constitutional:   No  weight loss, night sweats,  Fevers, chills,  +fatigue, or  lassitude.  HEENT:   No headaches,  Difficulty swallowing,  Tooth/dental problems, or  Sore throat,                No sneezing, itching, ear ache, nasal congestion, post nasal drip,   CV:  No chest pain,  Orthopnea, PND, swelling in lower extremities, anasarca, dizziness, palpitations, syncope.   GI  No heartburn, indigestion, abdominal pain, nausea, vomiting, diarrhea, change in bowel habits, loss of appetite, bloody stools.   Resp: No shortness of breath with exertion or at rest.  No excess mucus, no productive cough,  No non-productive cough,  No coughing up of blood.  No change in color of mucus.   No wheezing.  No chest wall deformity  Skin: no rash or lesions.  GU: no dysuria, change in color of urine, no urgency or frequency.  No flank pain, no hematuria   MS:  No joint pain or swelling.  No decreased range of motion.  No back pain.    Physical Exam  BP (!) 136/90 (BP Location: Left Arm, Patient Position: Sitting, Cuff Size: Large)   Pulse 84   Temp 98.1 F (36.7 C) (Oral)   Ht _0  (1.778 m)   Wt (!) 353 lb 12.8  oz (160.5 kg)   LMP 05/19/2022   SpO2 98%   BMI 50.77 kg/m   GEN: A/Ox3; pleasant , NAD, well nourished    HEENT:  [place]/AT,  NOSE-clear, THROAT-clear, no lesions, no postnasal drip or exudate noted.  Class III-IV MP airway  NECK:  Supple w/ fair ROM; no JVD; normal carotid impulses w/o bruits; no thyromegaly or nodules palpated; no lymphadenopathy.    RESP  Clear  P & A; w/o, wheezes/ rales/ or rhonchi. no accessory muscle use, no dullness to percussion  CARD:  RRR, no m/r/g, no peripheral edema, pulses intact, no cyanosis or clubbing.  GI:   Soft & nt; nml bowel sounds; no organomegaly or masses detected.   Musco: Warm bil, no deformities or joint swelling noted.   Neuro: alert, no focal deficits noted.    Skin: Warm, no lesions or rashes    Lab Results:  CBC    Component Value Date/Time   WBC 9.9 03/30/2022 1558   WBC 9.9 12/12/2021 2341   RBC 5.43 (H) 03/30/2022 1558   RBC 5.89 (H) 12/12/2021 2341   HGB 11.3 03/30/2022 1558   HCT 35.8 03/30/2022 1558   PLT 418 03/30/2022 1558   MCV 66 (L) 03/30/2022 1558   MCH 20.8 (L) 03/30/2022 1558   MCH 20.4 (L) 12/12/2021 2341   MCHC 31.6 03/30/2022 1558   MCHC 31.4 12/12/2021 2341   RDW 18.0 (H) 03/30/2022 1558   LYMPHSABS 4.5 (H) 12/12/2021 2341   LYMPHSABS 4.4 (H) 01/21/2021 0935   MONOABS 0.7 12/12/2021 2341   EOSABS 0.1 12/12/2021 2341   EOSABS 0.1 01/21/2021 0935   BASOSABS 0.1 12/12/2021 2341   BASOSABS 0.1 01/21/2021 0935    BMET    Component Value Date/Time   NA 136 03/30/2022  1558   K 3.9 03/30/2022 1558   CL 102 03/30/2022 1558   CO2 19 (L) 03/30/2022 1558   GLUCOSE 268 (H) 03/30/2022 1558   GLUCOSE 270 (H) 12/12/2021 2341   BUN 10 03/30/2022 1558   CREATININE 0.73 03/30/2022 1558   CREATININE 0.70 04/26/2017 1133   CALCIUM 9.8 03/30/2022 1558   GFRNONAA >60 12/12/2021 2341   GFRNONAA >89 04/26/2017 1133   GFRAA >60 04/27/2020 2101   GFRAA >89 04/26/2017 1133    BNP No results found for: "BNP"  ProBNP    Component Value Date/Time   PROBNP <5.0 04/14/2014 2050    Imaging: No results found.        No data to display          No results found for: "NITRICOXIDE"      Assessment & Plan:   Obstructive sleep apnea History of severe sleep apnea.  Patient is off of her CPAP currently due to lack of supplies.  We will order new CPAP supplies.  Patient is to try the DreamWear nasal mask.  Patient education given on sleep apnea and potential complications of untreated sleep apnea. Patient will restart CPAP wear all night long.  - discussed how weight can impact sleep and risk for sleep disordered breathing - discussed options to assist with weight loss: combination of diet modification, cardiovascular and strength training exercises   - had an extensive discussion regarding the adverse health consequences related to untreated sleep disordered breathing - specifically discussed the risks for hypertension, coronary artery disease, cardiac dysrhythmias, cerebrovascular disease, and diabetes - lifestyle modification discussed   - discussed how sleep disruption can increase risk of accidents, particularly when driving - safe driving practices  were discussed   Plan  Patient Instructions  Restart CPAP At bedtime , wear all night long , at least 6hr or more .  Order for new supplies , dream wear nasal mask.  Do not drive if sleepy  Work on healthy weight loss  Healthy sleep regimen  Follow up in 6-8 weeks and As needed       Morbid  obesity due to excess calories ([place]) Healthy weight loss discussed     Rexene Edison, NP 07/23/2022

## 2022-07-23 NOTE — Patient Instructions (Signed)
Restart CPAP At bedtime , wear all night long , at least 6hr or more .  Order for new supplies , dream wear nasal mask.  Do not drive if sleepy  Work on healthy weight loss  Healthy sleep regimen  Follow up in 6-8 weeks and As needed

## 2022-07-23 NOTE — Assessment & Plan Note (Signed)
History of severe sleep apnea.  Patient is off of her CPAP currently due to lack of supplies.  We will order new CPAP supplies.  Patient is to try the DreamWear nasal mask.  Patient education given on sleep apnea and potential complications of untreated sleep apnea. Patient will restart CPAP wear all night long.  - discussed how weight can impact sleep and risk for sleep disordered breathing - discussed options to assist with weight loss: combination of diet modification, cardiovascular and strength training exercises   - had an extensive discussion regarding the adverse health consequences related to untreated sleep disordered breathing - specifically discussed the risks for hypertension, coronary artery disease, cardiac dysrhythmias, cerebrovascular disease, and diabetes - lifestyle modification discussed   - discussed how sleep disruption can increase risk of accidents, particularly when driving - safe driving practices were discussed   Plan  Patient Instructions  Restart CPAP At bedtime , wear all night long , at least 6hr or more .  Order for new supplies , dream wear nasal mask.  Do not drive if sleepy  Work on healthy weight loss  Healthy sleep regimen  Follow up in 6-8 weeks and As needed

## 2022-07-23 NOTE — Assessment & Plan Note (Signed)
Healthy weight loss discussed 

## 2022-07-24 NOTE — Telephone Encounter (Signed)
Noted. Will informed doctor that supplies for cpap machine have been supplied to the patient.  DME: [facility]  Nothing further needed

## 2022-07-28 ENCOUNTER — Ambulatory Visit: Payer: Federal, State, Local not specified - PPO

## 2022-07-28 ENCOUNTER — Other Ambulatory Visit: Payer: Self-pay | Admitting: Nurse Practitioner

## 2022-07-28 ENCOUNTER — Telehealth: Payer: Self-pay

## 2022-07-28 NOTE — Telephone Encounter (Signed)
Fluconazole   [facility] on [address]

## 2022-07-29 ENCOUNTER — Other Ambulatory Visit: Payer: Self-pay

## 2022-07-29 MED ORDER — FLUCONAZOLE 150 MG PO TABS: 150.0000 mg | ORAL_TABLET | Freq: Every day | ORAL | 0 refills | Status: DC

## 2022-07-29 NOTE — Telephone Encounter (Signed)
done

## 2022-07-30 ENCOUNTER — Ambulatory Visit: Payer: Federal, State, Local not specified - PPO | Admitting: Internal Medicine

## 2022-07-30 NOTE — Progress Notes (Deleted)
Name: Grace Daniels  MRN/ DOB: [id number], 1991/02/23   Age/ Sex: 31 y.o., female    PCP: Fenton Foy, NP   Reason for Endocrinology Evaluation: Type 2 Diabetes Mellitus     Date of Initial Endocrinology Visit: 07/30/2022     PATIENT IDENTIFIER: Grace Daniels is a 31 y.o. female with a past medical history of ***. The patient presented for initial endocrinology clinic visit on 07/30/2022 for consultative assistance with her diabetes management.    HPI: Grace Daniels was    Diagnosed with DM *** Prior Medications tried/Intolerance: *** Currently checking blood sugars *** x / day,  before breakfast and ***.  Hypoglycemia episodes : ***               Symptoms: ***                 Frequency: ***/  Hemoglobin A1c has ranged from 7.2% in 2020, peaking at 10.9% in 2023. Patient required assistance for hypoglycemia:  Patient has required hospitalization within the last 1 year from hyper or hypoglycemia:   In terms of diet, the patient ***   HOME DIABETES REGIMEN: Metformin 1000 mg twice daily Glipizide 10 mg twice daily   Statin: Yes ACE-I/ARB: No    METER DOWNLOAD SUMMARY: Date range evaluated: *** Fingerstick Blood Glucose Tests = *** Average Number Tests/Day = *** Overall Mean FS Glucose = *** Standard Deviation = ***  BG Ranges: Low = *** High = ***   Hypoglycemic Events/30 Days: BG < 50 = *** Episodes of symptomatic severe hypoglycemia = ***   DIABETIC COMPLICATIONS: Microvascular complications:  *** Denies: CKD Last eye exam: Completed   Macrovascular complications:  *** Denies: CAD, PVD, CVA   PAST HISTORY: Past Medical History:  Past Medical History:  Diagnosis Date   Coronavirus infection 11/2020   Diabetes ([place])    Hyperlipidemia    Hypertension    Morbid obesity ([place])    Right knee pain 05/2020   Right thigh pain 05/2020   Sleep apnea    Vitamin D deficiency 01/2021   Past Surgical History: No past surgical history on file.   Social History:  reports that she has never smoked. She has never been exposed to tobacco smoke. She has never used smokeless tobacco. She reports that she does not drink alcohol and does not use drugs. Family History: No family history on file.   HOME MEDICATIONS: Allergies as of 07/30/2022   No Known Allergies      Medication List        Accurate as of July 30, 2022  7:33 AM. If you have any questions, ask your nurse or doctor.          [employer] Blood Glucose Meter w/Device Kit 1 applicator by Does not apply route 4 (four) times daily -  before meals and at bedtime.   albuterol 108 (90 Base) MCG/ACT inhaler Commonly known as: VENTOLIN HFA Inhale 1-2 puffs into the lungs every 6 (six) hours as needed for wheezing or shortness of breath.   atorvastatin 10 MG tablet Commonly known as: LIPITOR Take 1 tablet (10 mg total) by mouth daily.   blood glucose meter kit and supplies Kit Dispense based on patient and insurance preference. Use up to four times daily as directed. (FOR ICD-9 250.00, 250.01).   Blood Pressure Monitor Automat Devi 1 kit by Does not apply route daily.   diclofenac 75 MG EC tablet Commonly known as: VOLTAREN Take 1 tablet (  75 mg total) by mouth 2 (two) times daily.   fluconazole 150 MG tablet Commonly known as: Diflucan Take 1 tablet (150 mg total) by mouth daily. May take 1 tablet today and repeat in 3 days   glipiZIDE 10 MG tablet Commonly known as: GLUCOTROL Take 1 tablet (10 mg total) by mouth 2 (two) times daily.   glucose blood test strip Use as instructed   metFORMIN 1000 MG tablet Commonly known as: GLUCOPHAGE Take 1 tablet (1,000 mg total) by mouth 2 (two) times daily with a meal.   triamterene-hydrochlorothiazide 37.5-25 MG capsule Commonly known as: DYAZIDE TAKE 1 EACH (1 CAPSULE TOTAL) BY MOUTH DAILY.   Vitamin D (Ergocalciferol) 1.25 MG (50000 UNIT) Caps capsule Commonly known as: DRISDOL Take 1 capsule (50,000 Units total)  by mouth every 7 (seven) days.         ALLERGIES: No Known Allergies   REVIEW OF SYSTEMS: A comprehensive ROS was conducted with the patient and is negative except as per HPI and below:  ROS    OBJECTIVE:   VITAL SIGNS: There were no vitals taken for this visit.   PHYSICAL EXAM:  General: Pt appears well and is in NAD  Neck: General: Supple without adenopathy or carotid bruits. Thyroid: Thyroid size normal.  No goiter or nodules appreciated.   Lungs: Clear with good BS bilat with no rales, rhonchi, or wheezes  Heart: RRR   Abdomen:  soft, nontender  Extremities:  Lower extremities - No pretibial edema. No lesions.  Skin: Normal texture and temperature to palpation. No rash noted.  Neuro: MS is good with appropriate affect, pt is alert and Ox3    DM foot exam:    DATA REVIEWED:  Lab Results  Component Value Date   HGBA1C 10.9 (A) 07/02/2022   HGBA1C 10.9 07/02/2022   HGBA1C 10.9 (A) 07/02/2022   HGBA1C 10.9 (A) 07/02/2022    Latest Reference Range & Units 03/30/22 15:58  Sodium 134 - 144 mmol/L 136  Potassium 3.5 - 5.2 mmol/L 3.9  Chloride 96 - 106 mmol/L 102  CO2 20 - 29 mmol/L 19 (L)  Glucose 70 - 99 mg/dL 268 (H)  BUN 6 - 20 mg/dL 10  Creatinine 0.57 - 1.00 mg/dL 0.73  Calcium 8.7 - 10.2 mg/dL 9.8  BUN/Creatinine Ratio 9 - 23  14  eGFR >59 mL/min/1.73 112  Alkaline Phosphatase 44 - 121 IU/L 79  Albumin 3.8 - 4.8 g/dL 4.5  Albumin/Globulin Ratio 1.2 - 2.2  1.4  AST 0 - 40 IU/L 7  ALT 0 - 32 IU/L 13  Total Protein 6.0 - 8.5 g/dL 7.8  Total Bilirubin 0.0 - 1.2 mg/dL <0.2    Latest Reference Range & Units 03/30/22 15:58  Total CHOL/HDL Ratio 0.0 - 4.4 ratio 4.7 (H)  Cholesterol, Total 100 - 199 mg/dL 180  HDL Cholesterol >39 mg/dL 38 (L)  Triglycerides 0 - 149 mg/dL 312 (H)  VLDL Cholesterol Cal 5 - 40 mg/dL 52 (H)  LDL Chol Calc (NIH) 0 - 99 mg/dL 90     ASSESSMENT / PLAN / RECOMMENDATIONS:   1) Type 2 Diabetes Mellitus, ***controlled,  With*** complications - Most recent A1c of *** %. Goal A1c < *** %.    Plan: GENERAL: ***  MEDICATIONS: ***  EDUCATION / INSTRUCTIONS: BG monitoring instructions: Patient is instructed to check her blood sugars *** times a day, ***. Call [facility] if: BG persistently < 70  I reviewed the Rule of 15 for the  treatment of hypoglycemia in detail with the patient. Literature supplied.   2) Diabetic complications:  Eye: Does *** have known diabetic retinopathy.  Neuro/ Feet: Does *** have known diabetic peripheral neuropathy. Renal: Patient does not have known baseline CKD. She is not on an ACEI/ARB at present.     3) Hypertriglyceridemia:      Signed electronically by: Mack Guise, MD  [facility]  [facility] [address] [place], [place] [address] Phone: [phone] FAX: [phone]   CC: Fenton Foy, NP [place] [address] [place] [address] Phone: [phone]  Fax: [phone]    Return to Endocrinology clinic as below: Future Appointments  Date Time Provider [place]  07/30/2022 12:30 PM Shamleffer, Melanie Crazier, MD [facility] None  08/04/2022  9:45 AM [facility] DM CORE CLASS 2 [facility] NDM  08/11/2022  9:45 AM [facility] DM CORE CLASS 3 [facility] NDM  09/03/2022  1:30 PM Parrett, Fonnie Mu, NP [facility] None  10/02/2022 10:20 AM Fenton Foy, NP [facility] None

## 2022-08-04 ENCOUNTER — Encounter: Payer: Self-pay | Admitting: Nurse Practitioner

## 2022-08-04 ENCOUNTER — Ambulatory Visit: Payer: Federal, State, Local not specified - PPO

## 2022-08-04 DIAGNOSIS — L299 Pruritus, unspecified: Secondary | ICD-10-CM | POA: Insufficient documentation

## 2022-08-04 NOTE — Progress Notes (Signed)
_0  ID: Grace Daniels, female    DOB: January 14, 1991, 31 y.o.   MRN: [id number]  Chief Complaint  Patient presents with   Urinary Tract Infection    Referring provider: Fenton Foy, NP   HPI  Patient presents today with UTI symptoms including irritation when urinating.  Patient is also having vaginal itching.  We will check UA and vaginal swab.Denies f/c/s, n/v/d, hemoptysis, PND, leg swelling Denies chest pain or edema      No Known Allergies  Immunization History  Administered Date(s) Administered   Tdap 03/26/2022    Past Medical History:  Diagnosis Date   Coronavirus infection 11/2020   Diabetes ([place])    Hyperlipidemia    Hypertension    Morbid obesity ([place])    Right knee pain 05/2020   Right thigh pain 05/2020   Sleep apnea    Vitamin D deficiency 01/2021    Tobacco History: Social History   Tobacco Use  Smoking Status Never   Passive exposure: Never  Smokeless Tobacco Never   Counseling given: Not Answered   Outpatient Encounter Medications as of 06/12/2022  Medication Sig   albuterol (VENTOLIN HFA) 108 (90 Base) MCG/ACT inhaler Inhale 1-2 puffs into the lungs every 6 (six) hours as needed for wheezing or shortness of breath.   atorvastatin (LIPITOR) 10 MG tablet Take 1 tablet (10 mg total) by mouth daily.   Blood Glucose Monitoring Suppl ([employer] BLOOD GLUCOSE METER) w/Device KIT 1 applicator by Does not apply route 4 (four) times daily -  before meals and at bedtime.   Blood Pressure Monitoring (BLOOD PRESSURE MONITOR AUTOMAT) DEVI 1 kit by Does not apply route daily.   diclofenac (VOLTAREN) 75 MG EC tablet Take 1 tablet (75 mg total) by mouth 2 (two) times daily.   glucose blood test strip Use as instructed   triamterene-hydrochlorothiazide (DYAZIDE) 37.5-25 MG capsule TAKE 1 EACH (1 CAPSULE TOTAL) BY MOUTH DAILY.   Vitamin D, Ergocalciferol, (DRISDOL) 1.25 MG (50000 UNIT) CAPS capsule Take 1 capsule (50,000 Units total) by mouth every 7 (seven)  days.   [DISCONTINUED] blood glucose meter kit and supplies KIT Dispense based on patient and insurance preference. Use up to four times daily as directed. (FOR ICD-9 250.00, 250.01).   [DISCONTINUED] fluconazole (DIFLUCAN) 150 MG tablet Take 1 tablet (150 mg total) by mouth daily. May take 1 tablet today and repeat in 3 days   [DISCONTINUED] glipiZIDE (GLUCOTROL) 10 MG tablet Take 1 tablet (10 mg total) by mouth 2 (two) times daily.   [DISCONTINUED] hydrOXYzine (ATARAX/VISTARIL) 10 MG tablet Take 1 tablet (10 mg total) by mouth 3 (three) times daily as needed for itching. (Patient not taking: Reported on 07/02/2022)   [DISCONTINUED] metFORMIN (GLUCOPHAGE) 1000 MG tablet Take 1 tablet (1,000 mg total) by mouth 2 (two) times daily with a meal.   No facility-administered encounter medications on file as of 06/12/2022.     Review of Systems  Review of Systems  Constitutional: Negative.   HENT: Negative.    Cardiovascular: Negative.   Gastrointestinal: Negative.   Genitourinary:  Positive for dysuria, frequency and vaginal discharge.  Allergic/Immunologic: Negative.   Neurological: Negative.   Psychiatric/Behavioral: Negative.         Physical Exam  There were no vitals taken for this visit.  Wt Readings from Last 5 Encounters:  07/23/22 (!) 353 lb 12.8 oz (160.5 kg)  06/29/22 (!) 340 lb (154.2 kg)  03/26/22 (!) 347 lb (157.4 kg)  04/25/21 (!) 351 lb 0.4  oz (159.2 kg)  01/21/21 (!) 357 lb (161.9 kg)     Physical Exam Vitals and nursing note reviewed.  Constitutional:      General: She is not in acute distress.    Appearance: She is well-developed.  Cardiovascular:     Rate and Rhythm: Normal rate and regular rhythm.  Pulmonary:     Effort: Pulmonary effort is normal.     Breath sounds: Normal breath sounds.  Neurological:     Mental Status: She is alert and oriented to person, place, and time.      Lab Results:  CBC    Component Value Date/Time   WBC 9.9  03/30/2022 1558   WBC 9.9 12/12/2021 2341   RBC 5.43 (H) 03/30/2022 1558   RBC 5.89 (H) 12/12/2021 2341   HGB 11.3 03/30/2022 1558   HCT 35.8 03/30/2022 1558   PLT 418 03/30/2022 1558   MCV 66 (L) 03/30/2022 1558   MCH 20.8 (L) 03/30/2022 1558   MCH 20.4 (L) 12/12/2021 2341   MCHC 31.6 03/30/2022 1558   MCHC 31.4 12/12/2021 2341   RDW 18.0 (H) 03/30/2022 1558   LYMPHSABS 4.5 (H) 12/12/2021 2341   LYMPHSABS 4.4 (H) 01/21/2021 0935   MONOABS 0.7 12/12/2021 2341   EOSABS 0.1 12/12/2021 2341   EOSABS 0.1 01/21/2021 0935   BASOSABS 0.1 12/12/2021 2341   BASOSABS 0.1 01/21/2021 0935    BMET    Component Value Date/Time   NA 136 03/30/2022 1558   K 3.9 03/30/2022 1558   CL 102 03/30/2022 1558   CO2 19 (L) 03/30/2022 1558   GLUCOSE 268 (H) 03/30/2022 1558   GLUCOSE 270 (H) 12/12/2021 2341   BUN 10 03/30/2022 1558   CREATININE 0.73 03/30/2022 1558   CREATININE 0.70 04/26/2017 1133   CALCIUM 9.8 03/30/2022 1558   GFRNONAA >60 12/12/2021 2341   GFRNONAA >89 04/26/2017 1133   GFRAA >60 04/27/2020 2101   GFRAA >89 04/26/2017 1133    Assessment & Plan:   Vaginal itching - Urine Culture - NuSwab Vaginitis Plus (VG+) - POCT URINALYSIS DIP (CLINITEK)   Follow up as needed     Fenton Foy, NP 08/04/2022

## 2022-08-04 NOTE — Patient Instructions (Addendum)
1. Itching  - Urine Culture - NuSwab Vaginitis Plus (VG+) - POCT URINALYSIS DIP (CLINITEK)   Follow up as needed

## 2022-08-04 NOTE — Assessment & Plan Note (Signed)
-   Urine Culture - NuSwab Vaginitis Plus (VG+) - POCT URINALYSIS DIP (CLINITEK)   Follow up as needed

## 2022-08-11 ENCOUNTER — Ambulatory Visit: Payer: Federal, State, Local not specified - PPO

## 2022-08-12 ENCOUNTER — Other Ambulatory Visit: Payer: Self-pay

## 2022-08-12 ENCOUNTER — Encounter (HOSPITAL_COMMUNITY): Payer: Self-pay

## 2022-08-12 ENCOUNTER — Emergency Department (HOSPITAL_COMMUNITY)
Admission: EM | Admit: 2022-08-12 | Discharge: 2022-08-12 | Disposition: A | Discharge status: Home / Self Care | Payer: Federal, State, Local not specified - PPO | Attending: Emergency Medicine | Admitting: Emergency Medicine

## 2022-08-12 DIAGNOSIS — R300 Dysuria: Secondary | ICD-10-CM | POA: Insufficient documentation

## 2022-08-12 DIAGNOSIS — R102 Pelvic and perineal pain: Secondary | ICD-10-CM | POA: Diagnosis not present

## 2022-08-12 DIAGNOSIS — L292 Pruritus vulvae: Secondary | ICD-10-CM | POA: Diagnosis not present

## 2022-08-12 DIAGNOSIS — Z7984 Long term (current) use of oral hypoglycemic drugs: Secondary | ICD-10-CM | POA: Diagnosis not present

## 2022-08-12 DIAGNOSIS — N898 Other specified noninflammatory disorders of vagina: Secondary | ICD-10-CM | POA: Insufficient documentation

## 2022-08-12 DIAGNOSIS — G4733 Obstructive sleep apnea (adult) (pediatric): Secondary | ICD-10-CM | POA: Diagnosis not present

## 2022-08-12 DIAGNOSIS — E119 Type 2 diabetes mellitus without complications: Secondary | ICD-10-CM | POA: Insufficient documentation

## 2022-08-12 LAB — URINALYSIS, ROUTINE W REFLEX MICROSCOPIC
Bilirubin Urine: NEGATIVE
Glucose, UA: >=500 mg/dL — AB
Ketones, ur: 5 mg/dL — AB
Leukocytes,Ua: NEGATIVE
Nitrite: NEGATIVE
Protein, ur: NEGATIVE mg/dL
Specific Gravity, Urine: 1.024 (ref 1.005–1.030)
pH: 5 (ref 5.0–8.0)

## 2022-08-12 LAB — WET PREP, GENITAL
Clue Cells Wet Prep HPF POC: NONE SEEN
Sperm: NONE SEEN
Trich, Wet Prep: NONE SEEN
WBC, Wet Prep HPF POC: <10 (ref <10)
Yeast Wet Prep HPF POC: NONE SEEN

## 2022-08-12 LAB — PREGNANCY, URINE: Preg Test, Ur: NEGATIVE

## 2022-08-12 MED ORDER — FLUCONAZOLE 150 MG PO TABS: 150.0000 mg | ORAL_TABLET | Freq: Once | ORAL | Status: DC

## 2022-08-12 MED ORDER — DOXYCYCLINE HYCLATE 100 MG PO CAPS: 100.0000 mg | ORAL_CAPSULE | Freq: Two times a day (BID) | ORAL | 0 refills | Status: DC

## 2022-08-12 MED ORDER — FLUCONAZOLE 150 MG PO TABS: 150.0000 mg | ORAL_TABLET | Freq: Every day | ORAL | 0 refills | Status: AC

## 2022-08-12 MED ORDER — DOXYCYCLINE HYCLATE 100 MG PO TABS: 100.0000 mg | ORAL_TABLET | Freq: Once | ORAL | Status: DC

## 2022-08-12 MED ORDER — CEFTRIAXONE SODIUM 1 G IJ SOLR: 1.0000 g | Freq: Once | INTRAMUSCULAR | Status: DC

## 2022-08-12 NOTE — Discharge Instructions (Addendum)
Please avoid sexual contact until your testing comes back negative. Have all sexual partners tested. Please take diflucan after completing antibiotic.

## 2022-08-12 NOTE — ED Triage Notes (Signed)
Pt reports vaginal itching, irritation, and burning with urination after taking amoxicillin 3 weeks ago for dental pain, patient reports took diflucan 2 weeks ago with improvement but then the symptoms came back.

## 2022-08-12 NOTE — ED Provider Notes (Signed)
[facility] DEPT Provider Note   CSN: [id number] Arrival date & time: 08/12/22  1132     History  Chief Complaint  Patient presents with   Vaginal Itching    Grace Daniels is a 31 y.o. female, who presents to the ED 2/2 to dysuria, vaginal irritation, vaginal discharge for the last 2 weeks. Reports having a dental infection and being treated with amoxicillin when she developed burning with urination, vaginal itching and irritation. Denies any sores on vagina. Is sexually active. No fever chills. Endorses dysuria and urinary frequency.     Home Medications Prior to Admission medications   Medication Sig Start Date End Date Taking? Authorizing Provider  doxycycline (VIBRAMYCIN) 100 MG capsule Take 1 capsule (100 mg total) by mouth 2 (two) times daily. 08/12/22  Yes Melbourne Jakubiak L, PA  fluconazole (DIFLUCAN) 150 MG tablet Take 1 tablet (150 mg total) by mouth daily for 1 day. 08/12/22 08/13/22 Yes Clancy Mullarkey L, PA  albuterol (VENTOLIN HFA) 108 (90 Base) MCG/ACT inhaler Inhale 1-2 puffs into the lungs every 6 (six) hours as needed for wheezing or shortness of breath. 12/04/20   Palumbo, April, MD  atorvastatin (LIPITOR) 10 MG tablet Take 1 tablet (10 mg total) by mouth daily. 03/30/22   Fenton Foy, NP  blood glucose meter kit and supplies KIT Dispense based on patient and insurance preference. Use up to four times daily as directed. (FOR ICD-9 250.00, 250.01). 06/12/22   Fenton Foy, NP  Blood Glucose Monitoring Suppl [facility] BLOOD GLUCOSE METER) w/Device KIT 1 applicator by Does not apply route 4 (four) times daily -  before meals and at bedtime. 05/16/18   Azzie Glatter, FNP  Blood Pressure Monitoring (BLOOD PRESSURE MONITOR AUTOMAT) DEVI 1 kit by Does not apply route daily. 04/25/21   Vevelyn Francois, NP  diclofenac (VOLTAREN) 75 MG EC tablet Take 1 tablet (75 mg total) by mouth 2 (two) times daily. 03/28/21   Wallene Huh, DPM  fluconazole  (DIFLUCAN) 150 MG tablet Take 1 tablet (150 mg total) by mouth daily. May take 1 tablet today and repeat in 3 days 07/29/22   Fenton Foy, NP  glipiZIDE (GLUCOTROL) 10 MG tablet Take 1 tablet (10 mg total) by mouth 2 (two) times daily. 07/02/22   Fenton Foy, NP  glucose blood test strip Use as instructed 03/30/22   Fenton Foy, NP  metFORMIN (GLUCOPHAGE) 1000 MG tablet Take 1 tablet (1,000 mg total) by mouth 2 (two) times daily with a meal. 07/02/22 07/02/23  Fenton Foy, NP  triamterene-hydrochlorothiazide (DYAZIDE) 37.5-25 MG capsule TAKE 1 EACH (1 CAPSULE TOTAL) BY MOUTH DAILY. 03/30/22   Fenton Foy, NP  Vitamin D, Ergocalciferol, (DRISDOL) 1.25 MG (50000 UNIT) CAPS capsule Take 1 capsule (50,000 Units total) by mouth every 7 (seven) days. 01/22/21   Azzie Glatter, FNP      Allergies    Patient has no known allergies.    Review of Systems   Review of Systems  Constitutional:  Negative for chills and fever.  Genitourinary:  Positive for dysuria, vaginal discharge and vaginal pain.    Physical Exam Updated Vital Signs BP (!) 166/132   Pulse 92   Temp 98 F (36.7 C)   Resp 20   SpO2 99%  Physical Exam Vitals and nursing note reviewed. Exam conducted with a chaperone present.  Constitutional:      Appearance: Normal appearance.  HENT:  Head: Normocephalic and atraumatic.     Mouth/Throat:     Mouth: Mucous membranes are moist.  Eyes:     Extraocular Movements: Extraocular movements intact.     Pupils: Pupils are equal, round, and reactive to light.  Cardiovascular:     Rate and Rhythm: Normal rate and regular rhythm.     Pulses: Normal pulses.  Pulmonary:     Effort: Pulmonary effort is normal.     Breath sounds: Normal breath sounds.  Abdominal:     General: Abdomen is flat.     Palpations: Abdomen is soft.     Tenderness: There is no right CVA tenderness or left CVA tenderness.  Genitourinary:    General: Normal vulva.     Labia:         Right: No rash.        Left: No rash.      Vagina: Vaginal discharge present.     Comments: White thin discharge Musculoskeletal:     Cervical back: Normal range of motion and neck supple.  Neurological:     Mental Status: She is alert.     ED Results / Procedures / Treatments   Labs (all labs ordered are listed, but only abnormal results are displayed) Labs Reviewed  URINALYSIS, ROUTINE W REFLEX MICROSCOPIC - Abnormal; Notable for the following components:      Result Value   Glucose, UA >=500 (*)    Hgb urine dipstick Keishana Klinger (*)    Ketones, ur 5 (*)    Bacteria, UA RARE (*)    All other components within normal limits  WET PREP, GENITAL  URINE CULTURE  PREGNANCY, URINE  GC/CHLAMYDIA PROBE AMP ([place]) NOT AT [facility]    EKG None  Radiology No results found.  Procedures Procedures    Medications Ordered in ED Medications  cefTRIAXone (ROCEPHIN) injection 1 g (1 g Intramuscular Patient Refused/Not Given 08/12/22 1514)  doxycycline (VIBRA-TABS) tablet 100 mg (100 mg Oral Patient Refused/Not Given 08/12/22 1515)  fluconazole (DIFLUCAN) tablet 150 mg (150 mg Oral Patient Refused/Not Given 08/12/22 1515)    ED Course/ Medical Decision Making/ A&P Clinical Course as of 08/12/22 1637  Wed Aug 12, 2022  1500 Yeast Wet Prep HPF POC: NONE SEEN [BS]    Clinical Course User Index [BS] Adalie Mand, Si Gaul, PA                           Medical Decision Making Amount and/or Complexity of Data Reviewed Labs: ordered. Decision-making details documented in ED Course.  Risk Prescription drug management.   This patient presents to the ED for concern of vaginal itching, discharge ,dysuria  Co morbidities that complicate the patient evaluation  DM II  Lab Tests:  I Ordered, and personally interpreted labs.  The pertinent results include:  negative wet prep, rare bacteria  Test / Admission - Considered:  Patient is a 31 year old female, here for dysuria, vaginal  discharge, wet prep negative.  However diabetic, history of frequent yeast infections, given 1 dose of fluconazole while here.  Given sexual activity, was tested for gonorrhea and chlamydia, prophylactically treated.  Urine culture sent off.  Doxycycline, given, with ceftriaxone, discharged with doxycycline and strict return precautions.  Dose of fluconazole given at discharge, or after completion of antibiotics.  Return symptoms emphasized, discussed follow-up with primary care. Physical exam was significant for diffuse vaginal discharge that was gray/white in color.  However BV was negative. Final Clinical  Impression(s) / ED Diagnoses Final diagnoses:  Vaginal itching  Vaginal discharge  Dysuria    Rx / DC Orders ED Discharge Orders          Ordered    doxycycline (VIBRAMYCIN) 100 MG capsule  2 times daily        08/12/22 1507    fluconazole (DIFLUCAN) 150 MG tablet  Daily        08/12/22 1507              Anhar Mcdermott Carlean Jews, PA 08/12/22 1640    Cristie Hem, MD 08/13/22 [id number]

## 2022-08-13 DIAGNOSIS — E119 Type 2 diabetes mellitus without complications: Secondary | ICD-10-CM | POA: Diagnosis not present

## 2022-08-13 LAB — URINE CULTURE

## 2022-08-13 LAB — GC/CHLAMYDIA PROBE AMP (~~LOC~~) NOT AT ARMC
Chlamydia: NEGATIVE
Comment: NEGATIVE
Comment: NORMAL
Neisseria Gonorrhea: NEGATIVE

## 2022-09-01 ENCOUNTER — Encounter (HOSPITAL_COMMUNITY): Payer: Self-pay | Admitting: *Deleted

## 2022-09-01 ENCOUNTER — Ambulatory Visit (HOSPITAL_COMMUNITY)
Admission: EM | Admit: 2022-09-01 | Discharge: 2022-09-01 | Disposition: A | Discharge status: Home / Self Care | Payer: Federal, State, Local not specified - PPO | Attending: Urgent Care | Admitting: Urgent Care

## 2022-09-01 DIAGNOSIS — E1159 Type 2 diabetes mellitus with other circulatory complications: Secondary | ICD-10-CM | POA: Diagnosis not present

## 2022-09-01 DIAGNOSIS — E1165 Type 2 diabetes mellitus with hyperglycemia: Secondary | ICD-10-CM | POA: Diagnosis not present

## 2022-09-01 DIAGNOSIS — B3731 Acute candidiasis of vulva and vagina: Secondary | ICD-10-CM | POA: Insufficient documentation

## 2022-09-01 DIAGNOSIS — I10 Essential (primary) hypertension: Secondary | ICD-10-CM | POA: Insufficient documentation

## 2022-09-01 DIAGNOSIS — Z3202 Encounter for pregnancy test, result negative: Secondary | ICD-10-CM

## 2022-09-01 LAB — POC URINE PREG, ED: Preg Test, Ur: NEGATIVE

## 2022-09-01 LAB — POCT URINALYSIS DIPSTICK, ED / UC
Bilirubin Urine: NEGATIVE
Glucose, UA: >=1000 mg/dL — AB
Hgb urine dipstick: NEGATIVE
Ketones, ur: 15 mg/dL — AB
Nitrite: NEGATIVE
Protein, ur: NEGATIVE mg/dL
Specific Gravity, Urine: 1.015 (ref 1.005–1.030)
Urobilinogen, UA: 0.2 mg/dL (ref 0.0–1.0)
pH: 5.5 (ref 5.0–8.0)

## 2022-09-01 LAB — CBG MONITORING, ED: Glucose-Capillary: 411 mg/dL — ABNORMAL HIGH (ref 70–99)

## 2022-09-01 MED ORDER — CLOTRIMAZOLE 1 % VA CREA: 1.0000 | TOPICAL_CREAM | Freq: Every day | VAGINAL | 0 refills | Status: DC

## 2022-09-01 MED ORDER — FLUCONAZOLE 150 MG PO TABS
ORAL_TABLET | ORAL | 0 refills | Status: DC
Start: 2022-09-01 — End: 2022-12-08

## 2022-09-01 NOTE — ED Triage Notes (Signed)
Pt states that she has lower back pain and some vaginal irritation x 2 weeks. She is worried about a UTI. The back pain has got worse last couple days.

## 2022-09-01 NOTE — ED Provider Notes (Signed)
[place]    CSN: [id number] Arrival date & time: 09/01/22  5329      History   Chief Complaint Chief Complaint  Patient presents with   Back Pain    HPI Grace Daniels is a 31 y.o. female.   31 year old female presents today due to concerns of bilateral lower back pain.  States it started roughly 2 weeks ago.  Reports that it feels "deep, and not muscular.  Also reports some discomfort with urination, and vaginal discharge.  She is a known diabetic, has not taken her medications today, and does not check her blood sugar.  Is prone to yeast infections.  States that it is intensely pruritic both externally and internally.  Feels that many of her symptoms are related to yeast.  Denies any pelvic cramping or adnexal pain.  Is due for her menstrual period in the next week or so.  Denies any hematuria, flank pain, fever.  No history of kidney stones.  No abdominal pain, nausea, vomiting, diarrhea.   Back Pain   Past Medical History:  Diagnosis Date   Coronavirus infection 11/2020   Diabetes ([place])    Hyperlipidemia    Hypertension    Morbid obesity ([place])    Right knee pain 05/2020   Right thigh pain 05/2020   Sleep apnea    Vitamin D deficiency 01/2021    Patient Active Problem List   Diagnosis Date Noted   Itching 08/04/2022   Sleep apnea 07/02/2022   Acute pain of right thigh 06/24/2020   Acute pain of right knee 06/24/2020   Vaginal itching 08/01/2019   Vaginal burning 08/01/2019   Yeast infection 06/07/2019   Hemoglobin A1C greater than 9%, indicating poor diabetic control 05/14/2019   Type 2 diabetes mellitus without complication, without long-term current use of insulin ([place]) 03/08/2019   Elevated glucose 03/08/2019   Recent weight loss 03/08/2019   Obstructive sleep apnea 08/02/2017   Morbid obesity due to excess calories ([place]) 08/02/2017   HTN (hypertension) 04/26/2017   Abnormal uterine bleeding (AUB) 03/30/2016    History reviewed. No  pertinent surgical history.  OB History     Gravida  0   Para      Term      Preterm      AB      Living  0      SAB      IAB      Ectopic      Multiple      Live Births               Home Medications    Prior to Admission medications   Medication Sig Start Date End Date Taking? Authorizing Provider  atorvastatin (LIPITOR) 10 MG tablet Take 1 tablet (10 mg total) by mouth daily. 03/30/22  Yes Fenton Foy, NP  blood glucose meter kit and supplies KIT Dispense based on patient and insurance preference. Use up to four times daily as directed. (FOR ICD-9 250.00, 250.01). 06/12/22  Yes Fenton Foy, NP  Blood Glucose Monitoring Suppl (ACURA BLOOD GLUCOSE METER) w/Device KIT 1 applicator by Does not apply route 4 (four) times daily -  before meals and at bedtime. 05/16/18  Yes Azzie Glatter, FNP  Blood Pressure Monitoring (BLOOD PRESSURE MONITOR AUTOMAT) DEVI 1 kit by Does not apply route daily. 04/25/21  Yes Vevelyn Francois, NP  clotrimazole (GYNE-LOTRIMIN) 1 % vaginal cream Place 1 Applicatorful vaginally at bedtime. 09/01/22  Yes  Crain, Whitney L, PA  fluconazole (DIFLUCAN) 150 MG tablet Take one tab PO now. May repeat in 72 hours if needed 09/01/22  Yes Crain, Whitney L, PA  glipiZIDE (GLUCOTROL) 10 MG tablet Take 1 tablet (10 mg total) by mouth 2 (two) times daily. 07/02/22  Yes Fenton Foy, NP  glucose blood test strip Use as instructed 03/30/22  Yes Fenton Foy, NP  metFORMIN (GLUCOPHAGE) 1000 MG tablet Take 1 tablet (1,000 mg total) by mouth 2 (two) times daily with a meal. 07/02/22 07/02/23 Yes Fenton Foy, NP  triamterene-hydrochlorothiazide (DYAZIDE) 37.5-25 MG capsule TAKE 1 EACH (1 CAPSULE TOTAL) BY MOUTH DAILY. 03/30/22  Yes Fenton Foy, NP    Family History History reviewed. No pertinent family history.  Social History Social History   Tobacco Use   Smoking status: Never    Passive exposure: Never   Smokeless tobacco: Never   Vaping Use   Vaping Use: Never used  Substance Use Topics   Alcohol use: No   Drug use: No     Allergies   Patient has no known allergies.   Review of Systems Review of Systems  Musculoskeletal:  Positive for back pain.  As per HPI   Physical Exam Triage Vital Signs ED Triage Vitals  Enc Vitals Group     BP 09/01/22 1019 (!) 146/96     Pulse Rate 09/01/22 1019 89     Resp 09/01/22 1019 18     Temp 09/01/22 1019 97.8 F (36.6 C)     Temp Source 09/01/22 1019 Oral     SpO2 09/01/22 1019 97 %     Weight --      Height --      Head Circumference --      Peak Flow --      Pain Score 09/01/22 1017 10     Pain Loc --      Pain Edu? --      Excl. in [place]? --    No data found.  Updated Vital Signs BP (!) 146/96 (BP Location: Left Arm)   Pulse 89   Temp 97.8 F (36.6 C) (Oral)   Resp 18   LMP 08/12/2022 (Approximate)   SpO2 97%   Visual Acuity Right Eye Distance:   Left Eye Distance:   Bilateral Distance:    Right Eye Near:   Left Eye Near:    Bilateral Near:     Physical Exam Vitals and nursing note reviewed. Chaperone present: deferred.  Constitutional:      General: She is not in acute distress.    Appearance: Normal appearance. She is well-developed. She is obese. She is not ill-appearing, toxic-appearing or diaphoretic.  HENT:     Head: Normocephalic and atraumatic.     Right Ear: External ear normal.     Left Ear: External ear normal.     Mouth/Throat:     Mouth: Mucous membranes are moist.     Pharynx: No oropharyngeal exudate or posterior oropharyngeal erythema.  Eyes:     Extraocular Movements: Extraocular movements intact.     Conjunctiva/sclera: Conjunctivae normal.     Pupils: Pupils are equal, round, and reactive to light.  Cardiovascular:     Rate and Rhythm: Normal rate and regular rhythm.     Heart sounds: No murmur heard. Pulmonary:     Effort: Pulmonary effort is normal. No respiratory distress.     Breath sounds: No wheezing.   Abdominal:     General:  Abdomen is flat. Bowel sounds are normal. There is no distension. There are no signs of injury.     Palpations: Abdomen is soft. There is no shifting dullness, fluid wave, hepatomegaly, splenomegaly, mass or pulsatile mass.     Tenderness: There is no abdominal tenderness. There is no right CVA tenderness, left CVA tenderness, guarding or rebound. Negative signs include Murphy's sign, Rovsing's sign, McBurney's sign, psoas sign and obturator sign.     Hernia: No hernia is present. There is no hernia in the left inguinal area or right inguinal area.  Genitourinary:    General: Normal vulva.     Pubic Area: No rash or pubic lice.      Labia:        Right: No rash, tenderness, lesion or injury.        Left: No rash, tenderness, lesion or injury.      Urethra: No prolapse, urethral pain, urethral swelling or urethral lesion.     Vagina: No signs of injury and foreign body. Vaginal discharge (white, thick, chunky) present. No erythema, tenderness, bleeding, lesions or prolapsed vaginal walls.     Cervix: No cervical motion tenderness, discharge, friability, lesion, cervical bleeding or eversion.     Uterus: Normal. Not deviated, not enlarged, not fixed, not tender and no uterine prolapse.      Adnexa: Right adnexa normal and left adnexa normal.       Right: No mass, tenderness or fullness.         Left: No mass, tenderness or fullness.       Rectum: Normal.       Comments: Open skin Lymphadenopathy:     Lower Body: No right inguinal adenopathy. No left inguinal adenopathy.  Neurological:     Mental Status: She is alert.      UC Treatments / Results  Labs (all labs ordered are listed, but only abnormal results are displayed) Labs Reviewed  POCT URINALYSIS DIPSTICK, ED / UC - Abnormal; Notable for the following components:      Result Value   Glucose, UA >=1000 (*)    Ketones, ur 15 (*)    Leukocytes,Ua TRACE (*)    All other components within normal limits   CBG MONITORING, ED - Abnormal; Notable for the following components:   Glucose-Capillary 411 (*)    All other components within normal limits  POC URINE PREG, ED  CERVICOVAGINAL ANCILLARY ONLY    EKG   Radiology No results found.  Procedures Procedures (including critical care time)  Medications Ordered in UC Medications - No data to display  Initial Impression / Assessment and Plan / UC Course  I have reviewed the triage vital signs and the nursing notes.  Pertinent labs & imaging results that were available during my care of the patient were reviewed by me and considered in my medical decision making (see chart for details).     Yeast vaginitis -UA negative for urinary tract infection.  Pregnancy test negative.  Physical exam consistent with yeast vaginitis, likely secondary to uncontrolled diabetes.  Aptima swab was collected.  We will do Diflucan today, repeat in 72 hours if needed.  We will also do topical clotrimazole given both the internal and external appearance of yeast.  Several open areas to skin noted likely secondary to pruritus.  Recommended topical Vaseline on these areas. Type 2 diabetes with hyperglycemia -glucose 411 in office today.  Patient states she ate a large bag of fruit snacks in the waiting room and  has not taken her medications today.  We did spend a large amount of time this morning discussing carbohydrate counting, carbohydrate servings, healthy food choices, and ways to decrease glucose from a dietary aspect.  Patient has not checked her glucose in over a week.  Encouraged compliance with her medications, and glucose monitoring.  Recommended she follow-up with her PCP within the next week for further evaluation and follow-up regarding her DM. Essential HTN - also not controlled. Compliance with HTN meds reviewed. Monitor at home, will need f/u with PCP in one week.   Final Clinical Impressions(s) / UC Diagnoses   Final diagnoses:  Yeast vaginitis   Type 2 diabetes mellitus with hyperglycemia, without long-term current use of insulin ([place])  Essential hypertension     Discharge Instructions      You have yeast vaginitis.  This is likely secondary to your uncontrolled diabetes. Please take 1 tablet of Diflucan now.  You may repeat the second 1 in 3 days. Please use the clotrimazole vaginal cream both internally and externally. Please apply Vaseline to the irritated areas of your legs to help lubricate. Please follow-up with your primary care physician as soon as possible to further address your uncontrolled diabetes. Please try to maximize proteins and nonstarchy vegetables, cut back on carbohydrates as we discussed.  It is extremely important that your sugars remain in the low 100s to prevent problems to your eyes, kidneys, heart. Consider a referral to a dietitian to review healthy food options.    ED Prescriptions     Medication Sig Dispense Auth. Provider   fluconazole (DIFLUCAN) 150 MG tablet Take one tab PO now. May repeat in 72 hours if needed 2 tablet Eyob Godlewski L, PA   clotrimazole (GYNE-LOTRIMIN) 1 % vaginal cream Place 1 Applicatorful vaginally at bedtime. 45 g Yolunda Kloos L, [place]      PDMP not reviewed this encounter.   Chaney Malling, [place] 09/01/22 1116

## 2022-09-01 NOTE — Discharge Instructions (Addendum)
You have yeast vaginitis.  This is likely secondary to your uncontrolled diabetes. Please take 1 tablet of Diflucan now.  You may repeat the second 1 in 3 days. Please use the clotrimazole vaginal cream both internally and externally. Please apply Vaseline to the irritated areas of your legs to help lubricate. Please follow-up with your primary care physician as soon as possible to further address your uncontrolled diabetes. Please try to maximize proteins and nonstarchy vegetables, cut back on carbohydrates as we discussed.  It is extremely important that your sugars remain in the low 100s to prevent problems to your eyes, kidneys, heart. Consider a referral to a dietitian to review healthy food options.

## 2022-09-02 LAB — CERVICOVAGINAL ANCILLARY ONLY
Bacterial Vaginitis (gardnerella): NEGATIVE
Candida Glabrata: NEGATIVE
Candida Vaginitis: POSITIVE — AB
Chlamydia: NEGATIVE
Comment: NEGATIVE
Comment: NEGATIVE
Comment: NEGATIVE
Comment: NEGATIVE
Comment: NEGATIVE
Comment: NORMAL
Neisseria Gonorrhea: NEGATIVE
Trichomonas: NEGATIVE

## 2022-09-03 ENCOUNTER — Ambulatory Visit: Payer: Federal, State, Local not specified - PPO | Admitting: Adult Health

## 2022-09-11 DIAGNOSIS — G4733 Obstructive sleep apnea (adult) (pediatric): Secondary | ICD-10-CM | POA: Diagnosis not present

## 2022-10-02 ENCOUNTER — Ambulatory Visit: Payer: Self-pay | Admitting: Nurse Practitioner

## 2022-10-12 DIAGNOSIS — G4733 Obstructive sleep apnea (adult) (pediatric): Secondary | ICD-10-CM | POA: Diagnosis not present

## 2022-10-29 ENCOUNTER — Encounter: Payer: Self-pay | Admitting: Nurse Practitioner

## 2022-10-29 ENCOUNTER — Ambulatory Visit (INDEPENDENT_AMBULATORY_CARE_PROVIDER_SITE_OTHER): Payer: Federal, State, Local not specified - PPO | Admitting: Nurse Practitioner

## 2022-10-29 VITALS — BP 136/80 | HR 88 | Ht 70.0 in | Wt 341.0 lb

## 2022-10-29 DIAGNOSIS — E119 Type 2 diabetes mellitus without complications: Secondary | ICD-10-CM | POA: Diagnosis not present

## 2022-10-29 DIAGNOSIS — Z13220 Encounter for screening for lipoid disorders: Secondary | ICD-10-CM

## 2022-10-29 LAB — POCT GLYCOSYLATED HEMOGLOBIN (HGB A1C): Hemoglobin A1C: 12.8 % — AB (ref 4.0–5.6)

## 2022-10-29 MED ORDER — METFORMIN HCL 1000 MG PO TABS: 1000.0000 mg | ORAL_TABLET | Freq: Two times a day (BID) | ORAL | 3 refills | Status: DC

## 2022-10-29 MED ORDER — ATORVASTATIN CALCIUM 10 MG PO TABS: 10.0000 mg | ORAL_TABLET | Freq: Every day | ORAL | 3 refills | Status: DC

## 2022-10-29 MED ORDER — GLIPIZIDE 10 MG PO TABS: 10.0000 mg | ORAL_TABLET | Freq: Two times a day (BID) | ORAL | 3 refills | Status: DC

## 2022-10-29 NOTE — Progress Notes (Signed)
_0  ID: Grace Daniels, female    DOB: January 19, 1991, 31 y.o.   MRN: [id number]  Chief Complaint  Patient presents with   Follow-up    Referring provider: Fenton Foy, NP   HPI  Grace Daniels presents for follow up. A former patient of NP [place]. She  has a past medical history of Coronavirus infection (11/2020), Diabetes ([place]), Hyperlipidemia, Hypertension, Morbid obesity ([place]), Right knee pain (05/2020), Right thigh pain (05/2020), Sleep apnea, and Vitamin D deficiency (01/2021).    Patient presents today for a follow up visit. Overall she has been doing well.  Patient's hemoglobin A1c was up at 12.8 today compared to 10.9 at last visit. Suspect noncompliance with medications. Will consult with pharmacy for diabetic medication management. May benefit from ozempic. Denies f/c/s, n/v/d, hemoptysis, PND, leg swelling Denies chest pain or edema     No Known Allergies  Immunization History  Administered Date(s) Administered   Tdap 03/26/2022    Past Medical History:  Diagnosis Date   Coronavirus infection 11/2020   Diabetes ([place])    Hyperlipidemia    Hypertension    Morbid obesity ([place])    Right knee pain 05/2020   Right thigh pain 05/2020   Sleep apnea    Vitamin D deficiency 01/2021    Tobacco History: Social History   Tobacco Use  Smoking Status Never   Passive exposure: Never  Smokeless Tobacco Never   Counseling given: Not Answered   Outpatient Encounter Medications as of 10/29/2022  Medication Sig   blood glucose meter kit and supplies KIT Dispense based on patient and insurance preference. Use up to four times daily as directed. (FOR ICD-9 250.00, 250.01).   Blood Glucose Monitoring Suppl (ACURA BLOOD GLUCOSE METER) w/Device KIT 1 applicator by Does not apply route 4 (four) times daily -  before meals and at bedtime.   Blood Pressure Monitoring (BLOOD PRESSURE MONITOR AUTOMAT) DEVI 1 kit by Does not apply route daily.   clotrimazole  (GYNE-LOTRIMIN) 1 % vaginal cream Place 1 Applicatorful vaginally at bedtime.   fluconazole (DIFLUCAN) 150 MG tablet Take one tab PO now. May repeat in 72 hours if needed   glucose blood test strip Use as instructed   triamterene-hydrochlorothiazide (DYAZIDE) 37.5-25 MG capsule TAKE 1 EACH (1 CAPSULE TOTAL) BY MOUTH DAILY.   [DISCONTINUED] atorvastatin (LIPITOR) 10 MG tablet Take 1 tablet (10 mg total) by mouth daily.   [DISCONTINUED] glipiZIDE (GLUCOTROL) 10 MG tablet Take 1 tablet (10 mg total) by mouth 2 (two) times daily.   [DISCONTINUED] metFORMIN (GLUCOPHAGE) 1000 MG tablet Take 1 tablet (1,000 mg total) by mouth 2 (two) times daily with a meal.   atorvastatin (LIPITOR) 10 MG tablet Take 1 tablet (10 mg total) by mouth daily.   glipiZIDE (GLUCOTROL) 10 MG tablet Take 1 tablet (10 mg total) by mouth 2 (two) times daily.   metFORMIN (GLUCOPHAGE) 1000 MG tablet Take 1 tablet (1,000 mg total) by mouth 2 (two) times daily with a meal.   No facility-administered encounter medications on file as of 10/29/2022.     Review of Systems  Review of Systems  Constitutional: Negative.   HENT: Negative.    Cardiovascular: Negative.   Gastrointestinal: Negative.   Allergic/Immunologic: Negative.   Neurological: Negative.   Psychiatric/Behavioral: Negative.         Physical Exam  BP 136/80   Pulse 88   Ht _1  (1.778 m)   Wt (!) 341 lb (154.7 kg)   SpO2 98%  BMI 48.93 kg/m   Wt Readings from Last 5 Encounters:  10/29/22 (!) 341 lb (154.7 kg)  07/23/22 (!) 353 lb 12.8 oz (160.5 kg)  06/29/22 (!) 340 lb (154.2 kg)  03/26/22 (!) 347 lb (157.4 kg)  04/25/21 (!) 351 lb 0.4 oz (159.2 kg)     Physical Exam Vitals and nursing note reviewed.  Constitutional:      General: She is not in acute distress.    Appearance: She is well-developed.  Cardiovascular:     Rate and Rhythm: Normal rate and regular rhythm.  Pulmonary:     Effort: Pulmonary effort is normal.     Breath sounds:  Normal breath sounds.  Neurological:     Mental Status: She is alert and oriented to person, place, and time.       Assessment & Plan:   Type 2 diabetes mellitus without complication, without long-term current use of insulin ([facility]) - POCT glycosylated hemoglobin (Hb A1C) - AMB Referral to Pharmacy Medication Management - atorvastatin (LIPITOR) 10 MG tablet; Take 1 tablet (10 mg total) by mouth daily.  Dispense: 90 tablet; Refill: 3 - metFORMIN (GLUCOPHAGE) 1000 MG tablet; Take 1 tablet (1,000 mg total) by mouth 2 (two) times daily with a meal.  Dispense: 180 tablet; Refill: 3 - glipiZIDE (GLUCOTROL) 10 MG tablet; Take 1 tablet (10 mg total) by mouth 2 (two) times daily.  Dispense: 180 tablet; Refill: 3   Follow up:  Follow up in 3 months or sooner     Fenton Foy, NP 10/29/2022

## 2022-10-29 NOTE — Assessment & Plan Note (Signed)
-   POCT glycosylated hemoglobin (Hb A1C) - AMB Referral to Pharmacy Medication Management - atorvastatin (LIPITOR) 10 MG tablet; Take 1 tablet (10 mg total) by mouth daily.  Dispense: 90 tablet; Refill: 3 - metFORMIN (GLUCOPHAGE) 1000 MG tablet; Take 1 tablet (1,000 mg total) by mouth 2 (two) times daily with a meal.  Dispense: 180 tablet; Refill: 3 - glipiZIDE (GLUCOTROL) 10 MG tablet; Take 1 tablet (10 mg total) by mouth 2 (two) times daily.  Dispense: 180 tablet; Refill: 3   Follow up:  Follow up in 3 months or sooner

## 2022-10-29 NOTE — Patient Instructions (Addendum)
1. Type 2 diabetes mellitus without complication, without long-term current use of insulin (HCC)  - POCT glycosylated hemoglobin (Hb A1C) - AMB Referral to Pharmacy Medication Management - atorvastatin (LIPITOR) 10 MG tablet; Take 1 tablet (10 mg total) by mouth daily.  Dispense: 90 tablet; Refill: 3 - metFORMIN (GLUCOPHAGE) 1000 MG tablet; Take 1 tablet (1,000 mg total) by mouth 2 (two) times daily with a meal.  Dispense: 180 tablet; Refill: 3 - glipiZIDE (GLUCOTROL) 10 MG tablet; Take 1 tablet (10 mg total) by mouth 2 (two) times daily.  Dispense: 180 tablet; Refill: 3   Follow up:  Follow up in 3 months or sooner

## 2022-10-30 LAB — COMPREHENSIVE METABOLIC PANEL
ALT: 12 IU/L (ref 0–32)
AST: 12 IU/L (ref 0–40)
Albumin/Globulin Ratio: 1.2 (ref 1.2–2.2)
Albumin: 4.1 g/dL (ref 3.9–4.9)
Alkaline Phosphatase: 74 IU/L (ref 44–121)
BUN/Creatinine Ratio: 10 (ref 9–23)
BUN: 7 mg/dL (ref 6–20)
Bilirubin Total: 0.3 mg/dL (ref 0.0–1.2)
CO2: 21 mmol/L (ref 20–29)
Calcium: 9.6 mg/dL (ref 8.7–10.2)
Chloride: 100 mmol/L (ref 96–106)
Creatinine, Ser: 0.68 mg/dL (ref 0.57–1.00)
Globulin, Total: 3.4 g/dL (ref 1.5–4.5)
Glucose: 307 mg/dL — ABNORMAL HIGH (ref 70–99)
Potassium: 4 mmol/L (ref 3.5–5.2)
Sodium: 135 mmol/L (ref 134–144)
Total Protein: 7.5 g/dL (ref 6.0–8.5)
eGFR: 119 mL/min/{1.73_m2} (ref >59)

## 2022-10-30 LAB — CBC
Hematocrit: 38.7 % (ref 34.0–46.6)
Hemoglobin: 11.8 g/dL (ref 11.1–15.9)
MCH: 21.4 pg — ABNORMAL LOW (ref 26.6–33.0)
MCHC: 30.5 g/dL — ABNORMAL LOW (ref 31.5–35.7)
MCV: 70 fL — ABNORMAL LOW (ref 79–97)
Platelets: 369 10*3/uL (ref 150–450)
RBC: 5.51 x10E6/uL — ABNORMAL HIGH (ref 3.77–5.28)
RDW: 16.6 % — ABNORMAL HIGH (ref 11.7–15.4)
WBC: 9.2 10*3/uL (ref 3.4–10.8)

## 2022-10-30 LAB — LIPID PANEL
Chol/HDL Ratio: 4.3 ratio (ref 0.0–4.4)
Cholesterol, Total: 149 mg/dL (ref 100–199)
HDL: 35 mg/dL — ABNORMAL LOW (ref >39)
LDL Chol Calc (NIH): 89 mg/dL (ref 0–99)
Triglycerides: 138 mg/dL (ref 0–149)
VLDL Cholesterol Cal: 25 mg/dL (ref 5–40)

## 2022-11-03 ENCOUNTER — Telehealth: Payer: Self-pay

## 2022-11-03 NOTE — Progress Notes (Signed)
   Care Guide Note  11/03/2022 Name: Grace Daniels MRN: [id number] DOB: 1990/11/22  Referred by: Ivonne Andrew, NP Reason for referral : Care Coordination (Outreach to schedule with Pharm D )   Grace Daniels is a 31 y.o. year old female who is a primary care patient of Ivonne Andrew, NP. [name] was referred to the pharmacist for assistance related to DM.    Successful contact was made with the patient to discuss pharmacy services including being ready for the pharmacist to call at least 5 minutes before the scheduled appointment time, to have medication bottles and any blood sugar or blood pressure readings ready for review. The patient agreed to meet with the pharmacist via with the pharmacist via telephone visit on (date/time).  11/30/2022  Penne Lash, RMA Care Guide [facility]  [place], [place] [address] Direct Dial: [phone] [email or web address]

## 2022-11-17 DIAGNOSIS — G4733 Obstructive sleep apnea (adult) (pediatric): Secondary | ICD-10-CM | POA: Diagnosis not present

## 2022-11-30 ENCOUNTER — Other Ambulatory Visit: Payer: Federal, State, Local not specified - PPO | Admitting: Pharmacist

## 2022-11-30 MED ORDER — METFORMIN HCL ER 500 MG PO TB24: 500.0000 mg | ORAL_TABLET | Freq: Two times a day (BID) | ORAL | 2 refills | Status: DC

## 2022-11-30 MED ORDER — OZEMPIC (0.25 OR 0.5 MG/DOSE) 2 MG/3ML ~~LOC~~ SOPN: PEN_INJECTOR | SUBCUTANEOUS | 2 refills | Status: DC

## 2022-11-30 MED ORDER — GLIPIZIDE 5 MG PO TABS: 5.0000 mg | ORAL_TABLET | Freq: Two times a day (BID) | ORAL | 0 refills | Status: DC

## 2022-11-30 NOTE — Progress Notes (Signed)
11/30/2022 Name: Grace Daniels MRN: [id number] DOB: 1991-04-02  Chief Complaint  Patient presents with   Medication Management   Diabetes   Hypertension   Hyperlipidemia    Grace Daniels is a 32 y.o. year old female who presented for a telephone visit.   They were referred to the pharmacist by their PCP for assistance in managing diabetes.    Subjective:  Care Team: Primary Care Provider: Fenton Foy, NP ; Next Scheduled Visit: 01/28/23  Medication Access/Adherence  Current Pharmacy:  [id number] [place], [place] [address] Phone: [phone] Fax: [employer], [place] [address] Phone: [phone] Fax: [phone]   Patient reports affordability concerns with their medications: No  Patient reports access/transportation concerns to their pharmacy: No  Patient reports adherence concerns with their medications:  Yes    [facility] Medication Adherence Questionnaire   11/29/2022  1:31 PM EST - Filed by Patient  During the past 2 weeks, have you missed any doses of your medication(s)? Yes  During the past 2 weeks, have you missed any doses of your medications because you could not afford them? No  During the past 2 weeks, have you missed any doses of your medications because you could not get to the pharmacy? No  During the past 2 weeks, have you missed any doses of your medications because you could not get refills? No  During the past 2 weeks, have you missed any doses of your medication because you do not know what the medication is for? No  During the past 2 weeks, have you missed any doses of your medications because your medications make you feel sick? Yes  During the past 2 weeks, have you missed any doses of your medications because you have too many medications? Yes  During the past  2 weeks, have you missed any doses of your medications because you forgot to take your medications? Yes  During the past 2 weeks, have you missed any doses of your medications because you feel fine and do not think you need the medication(s)? Yes  Have you missed doses of your medications in the past 2 weeks for any other reason not listed above? No      Works 3 pm - 11:30 pm;   Diabetes:  Current medications: metformin 1000 mg twice daily, glipizide 10 mg twice daily   Does report diarrhea  Current glucose readings: has not been checking readings lately, but most recent post prandials 250s  Patient denies hypoglycemic s/sx including dizziness, shakiness, sweating. Patient reports hyperglycemic symptoms including polyuria, polydipsia  Current meal patterns:  - First meal ~ 1 pm; notes she is a picky eater overall. 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, but no formal exercise routine  Hypertension:  Current medications: triamterene/HCTZ 37.5/25 mg daily  Denies having had issues with swelling/edema that prompted this medication   Hyperlipidemia/ASCVD Risk Reduction  Current lipid lowering medications: atorvastatin 10 mg daily  PREVENT ACC/AHA Online Calculator:   This individual has an estimated 10-year risk of CVD: = 5.4% This individual has an estimated 30-year risk of CVD: = 29.9%  Objective:  Lab Results  Component Value Date   HGBA1C  12.8 (A) 10/29/2022    Lab Results  Component Value Date   CREATININE 0.68 10/29/2022   BUN 7 10/29/2022   NA 135 10/29/2022   K 4.0 10/29/2022   CL 100 10/29/2022   CO2 21 10/29/2022    Lab Results  Component Value Date   CHOL 149 10/29/2022   HDL 35 (L) 10/29/2022   LDLCALC 89 10/29/2022   TRIG 138 10/29/2022   CHOLHDL 4.3 10/29/2022    Medications Reviewed Today     Reviewed by  Osker Mason, RPH-CPP (Pharmacist) on 11/30/22 at 11  Med List Status: <None>   Medication Order Taking? Sig Documenting Provider Last Dose Status Informant  atorvastatin (LIPITOR) 10 MG tablet [id number] Yes Take 1 tablet (10 mg total) by mouth daily. Fenton Foy, NP Taking Active   blood glucose meter kit and supplies KIT [id number] Yes Dispense based on patient and insurance preference. Use up to four times daily as directed. (FOR ICD-9 250.00, 250.01). Fenton Foy, NP Taking Active   Blood Glucose Monitoring Suppl [facility] BLOOD GLUCOSE METER) w/Device KIT [id number] Yes 1 applicator by Does not apply route 4 (four) times daily -  before meals and at bedtime. Azzie Glatter, FNP Taking Active Self  Blood Pressure Monitoring (BLOOD PRESSURE MONITOR AUTOMAT) DEVI [id number] Yes 1 kit by Does not apply route daily. Vevelyn Francois, NP Taking Active   clotrimazole (GYNE-LOTRIMIN) 1 % vaginal cream [id number] Yes Place 1 Applicatorful vaginally at bedtime. Crain, Whitney L, PA Taking Active   fluconazole (DIFLUCAN) 150 MG tablet [id number] Yes Take one tab PO now. May repeat in 72 hours if needed Crain, Whitney L, [place] Taking Active   glipiZIDE (GLUCOTROL) 10 MG tablet [id number] Yes Take 1 tablet (10 mg total) by mouth 2 (two) times daily. Fenton Foy, NP Taking Active   glucose blood test strip [id number] Yes Use as instructed Fenton Foy, NP Taking Active   metFORMIN (GLUCOPHAGE) 1000 MG tablet [id number] Yes Take 1 tablet (1,000 mg total) by mouth 2 (two) times daily with a meal. Fenton Foy, NP Taking Active   triamterene-hydrochlorothiazide (DYAZIDE) 37.5-25 MG capsule [id number] Yes TAKE 1 EACH (1 CAPSULE TOTAL) BY MOUTH DAILY. Fenton Foy, NP Taking Active               Assessment/Plan:   Diabetes: - Currently uncontrolled - Reviewed long term cardiovascular and renal outcomes of uncontrolled blood sugar - Reviewed goal A1c, goal fasting, and goal 2 hour  post prandial glucose - Reviewed dietary modifications including: monitoring portion sizes of carbohydrates; focus on lean proteins, vegetables and fruits, whole grains.  - Reviewed lifestyle modifications including: setting goal of increasing moderate intensity physical activity - Recommend to change metformin to XR formulation due to diarrhea, start at 500 mg twice daily. Recommend to start [place]. Discussed mechanism of action, side effects. No history of medullary thyroid cancer, pancreatitis, or gallbladder disease. Discussed side effects. Patient amenable. Recommend to reduce glipizide to 5 mg twice daily, goal of eventual discontinuation. Discussed with PCP, they are in agreement. Orders placed.  - Recommend to check glucose twice daily, fasting and 2 hour post prandial  Hypertension: - Currently controlled - Will discuss moving forward.   Hyperlipidemia/ASCVD Risk Reduction: - Currently unclear if treatment is indicated given age, though patient has risk factors of diabetes, hypertension, and obesity.  - Recommend to continue current therapy at this time.    Follow Up Plan: phone call in 5 weeks  Catie Eppie Gibson, PharmD, BCACP, CPP [facility] [phone]

## 2022-11-30 NOTE — Patient Instructions (Addendum)
Grace Daniels,   It was great talking to you today!  Let's stop the metformin you have and start metformin XR 500 mg twice daily. This version has a lower risk of diarrhea and stomach upset.   Decrease glipizide to 5 mg (1/2 tablet) twice daily. This medication can cause low blood sugars and make it harder to lose weight, so our goal will be to eventually get off this medication.   Start Ozempic 0.25 mg weekly for 4 weeks, then increase to 0.5 mg weekly. This medication may cause stomach upset, queasiness, or constipation, especially when first starting. This generally improves over time. Call our office if these symptoms occur and worsen, or if you have severe symptoms such as vomiting, diarrhea, or stomach pain.   Here is the How To Use video on Ozempic:  RunningShows.fr  https://www.ozempic.com/how-to-take/ozempic-pen.html#:~:text=Select%20your%20dose-,Turn%20the%20dose%20selector%20until%20the%20dose%20counter%20shows%20the,the%20yellow%2Dlabel%20pen).&text=Insert%20the%20needle%20into%20your,0%2C%20slowly%20count%20to%206  Please call me with any questions about that!  Check your blood sugars twice daily:  1) Fasting, first thing before your first meal of the day and  2) 2 hours after your largest meal.   For a goal A1c of less than 7%, goal fasting readings are less than 130 and goal 2 hour after meal readings are less than 180.    This is a website with lots of good dietary/recipe resources: https://diabeteseducation.BakeryReview.is  Take care!  Catie Hedwig Morton, PharmD, [place], [facility] [phone]

## 2022-12-08 ENCOUNTER — Other Ambulatory Visit: Payer: Self-pay | Admitting: Nurse Practitioner

## 2022-12-08 NOTE — Telephone Encounter (Signed)
Caller & Relationship to patient:  MRN #  [id number]   Call Back Number:   Date of Last Office Visit: 10/29/2022     Date of Next Office Visit: 01/28/2023    Medication(s) to be Refilled: Yeast infection med  Michela Pitcher has a history of infections due to sugar   Preferred Pharmacy:   ** Please notify patient to allow 48-72 hours to process** **Let patient know to contact pharmacy at the end of the day to make sure medication is ready. ** **If patient has not been seen in a year or longer, book an appointment **Advise to use MyChart for refill requests OR to contact their pharmacy

## 2022-12-09 MED ORDER — FLUCONAZOLE 150 MG PO TABS: ORAL_TABLET | ORAL | 0 refills | Status: DC

## 2022-12-18 DIAGNOSIS — G4733 Obstructive sleep apnea (adult) (pediatric): Secondary | ICD-10-CM | POA: Diagnosis not present

## 2022-12-31 ENCOUNTER — Other Ambulatory Visit: Payer: Federal, State, Local not specified - PPO | Admitting: Pharmacist

## 2022-12-31 MED ORDER — METFORMIN HCL ER 500 MG PO TB24: 1000.0000 mg | ORAL_TABLET | Freq: Two times a day (BID) | ORAL | 1 refills | Status: DC

## 2022-12-31 MED ORDER — OZEMPIC (0.25 OR 0.5 MG/DOSE) 2 MG/3ML ~~LOC~~ SOPN: 0.5000 mg | PEN_INJECTOR | SUBCUTANEOUS | 2 refills | Status: DC

## 2022-12-31 NOTE — Patient Instructions (Signed)
Dollye,   It was great talking to you today!  Increase metformin to four tablets daily - two XR 500 mg tablets twice daily. Increase Ozempic to 0.5 mg weekly. Stop glipizide.   Check your blood sugars twice daily:  1) Fasting, first thing before your first meal of the day.  2) 2 hours after your largest meal.   For a goal A1c of less than 7%, goal fasting readings are less than 130 and goal 2 hour after meal readings are less than 180.     Take care!  Catie Hedwig Morton, PharmD, [place], [facility] [phone]

## 2022-12-31 NOTE — Progress Notes (Signed)
12/31/2022 Name: Grace Daniels MRN: [id number] DOB: 11/02/1991  Chief Complaint  Patient presents with   Medication Management   Diabetes    Grace Daniels is a 32 y.o. year old female who presented for a telephone visit.   They were referred to the pharmacist by their PCP for assistance in managing diabetes.    Subjective:  Care Team: Primary Care Provider: Fenton Foy, NP ; Next Scheduled Visit: 01/28/23  Medication Access/Adherence  [facility]  [id number] [place]Phone: [phone]: [address]Phone: [phone]: [phone]  Patient reports affordability concerns with their medications: No  Patient reports access/transportation concerns to their pharmacy: No  Patient reports adherence concerns with their medications:  No     Diabetes:  Current medications: metformin XR 500 mg twice daily, Ozempic 0.25 mg weekly x 4 weeks, glipizide 5 mg twice daily  Current glucose readings: post prandial readings ~200s  Patient reports occasional hypoglycemic s/sx including dizziness, shakiness, sweating.   Hypertension:  Current medications: triamterene/HCTZ 37.5/25 mg daily  Objective:  Lab Results  Component Value Date   HGBA1C 12.8 (A) 10/29/2022    Lab Results  Component Value Date   CREATININE 0.68 10/29/2022   BUN 7 10/29/2022   NA 135 10/29/2022   K 4.0 10/29/2022   CL 100 10/29/2022   CO2 21 10/29/2022    Lab Results  Component Value Date   CHOL 149 10/29/2022   HDL 35 (L) 10/29/2022   LDLCALC 89 10/29/2022   TRIG 138 10/29/2022   CHOLHDL 4.3 10/29/2022    Medications Reviewed Today     Reviewed by HOsker Mason RPH-CPP (Pharmacist) on 12/31/22 at 1131  Med List Status: <None>   Medication  Order Taking? Sig Documenting Provider Last Dose Status Informant  atorvastatin (LIPITOR) 10 MG tablet [id number] Take 1 tablet (10 mg total) by mouth daily. NFenton Foy NP Taking Active   blood glucose meter kit and supplies KIT [id number] Dispense based on patient and insurance preference. Use up to four times daily as directed. (FOR ICD-9 250.00, 250.01). NFenton Foy NP  Active   Blood Glucose Monitoring Suppl ([facility] DtBLOOD GLUCOSE METER) w/Device KIT [id number] 1 applicator by Does not apply route 4 (four) times daily -  before meals and at bedtime. SAzzie Glatter FNP  Active Self  Blood Pressure Monitoring (BLOOD PRESSURE MONITOR AUTOMAT) DEVI [id number] 1 kit by Does not apply route daily. KVevelyn Francois NP  Active   clotrimazole (GYNE-LOTRIMIN) 1 % vaginal cream [id number] Place 1 Applicatorful vaginally at bedtime. Crain, Whitney L, PA  Active   fluconazole (DIFLUCAN) 150 MG tablet [id number] Take one tab PO now. May repeat in 72 hours if needed NFenton Foy NP  Active   glipiZIDE (GLUCOTROL) 5 MG tablet [id number] Take 1 tablet (5 mg total) by mouth 2 (two) times daily before a meal. NFenton Foy NP Taking Active   glucose blood test strip [id number] Use as instructed NFenton Foy NP  Active   metFORMIN (GLUCOPHAGE-XR) 500 MG 24 hr tablet [id number] Take 1 tablet (500 mg total) by mouth 2 (two) times daily with a meal. NFenton Foy NP Taking Active   Semaglutide,0.25 or 0.5MG/DOS, (  OZEMPIC, 0.25 OR 0.5 MG/DOSE,) 2 MG/3ML SOPN [id number] Yes Inject 0.25 mg weekly for 4 weeks then increase to 0.5 mg weekly Fenton Foy, NP Taking Active   triamterene-hydrochlorothiazide (DYAZIDE) 37.5-25 MG capsule [id number] Yes TAKE 1 EACH (1 CAPSULE TOTAL) BY MOUTH DAILY. Fenton Foy, NP Taking Active               Assessment/Plan:   Diabetes: - Currently uncontrolled but likely improving - Reviewed goal A1c, goal fasting, and goal 2 hour post  prandial glucose - Recommend to increase Ozempic to 0.5 mg as indicated; increase metformin to XR 500 mg two tablets twice daily; stop glipizide to reduce risk of hypoglycemia. Discussed with PCP, she's in agreement. Orders placed.    Hypertension: - Currently uncontrolled - Reviewed long term cardiovascular and renal outcomes of uncontrolled blood pressure - Recommend to check albuminuria to determine if ARB needed.     Follow Up Plan: phone call in 8 weeks  Catie TJodi Mourning, PharmD, [place], [facility] [phone]

## 2023-01-16 DIAGNOSIS — G4733 Obstructive sleep apnea (adult) (pediatric): Secondary | ICD-10-CM | POA: Diagnosis not present

## 2023-01-28 ENCOUNTER — Ambulatory Visit: Payer: Self-pay | Admitting: Nurse Practitioner

## 2023-03-04 ENCOUNTER — Other Ambulatory Visit: Payer: Federal, State, Local not specified - PPO | Admitting: Pharmacist

## 2023-03-04 NOTE — Progress Notes (Signed)
03/04/2023 Name: Grace Daniels MRN: [id number] DOB: 1990/11/22  Chief Complaint  Patient presents with   Medication Management   Diabetes    AMIT MELOY is a 32 y.o. year old female who presented for a telephone visit.   They were referred to the pharmacist by their PCP for assistance in managing diabetes.   Subjective:  Care Team: Primary Care Provider: Ivonne Andrew, NP ; Next Scheduled Visit: needs to reschedule  Medication Access/Adherence  [facility]acy[facility]  [id number] [place], [place] - [address] AT [facility] [address] [place] [address] Phone: [phone] Fax: [phone]  [facility] - [facility] [address], [address] [place] [address] Phone: [phone] Fax: [phone]   Patient reports affordability concerns with their medications: No  Patient reports access/transportation concerns to their pharmacy: No  Patient reports adherence concerns with their medications:  No     Diabetes:  Current medications: metformin XR 500 mg twice daily, Ozempic 0.5 mg weekly Medications tried in the past: glipizide - discontinued to reduce risk of hypotension  Notes some occasional stomach upset, but altogether tolerable  Current glucose readings: reports a post prandial reading around 170  Patient denies hypoglycemic s/sx including dizziness, shakiness, sweating. Patient denies hyperglycemic symptoms including polyuria, polydipsia, polyphagia, nocturia, neuropathy, blurred vision.   Hypertension:  Current medications: triamterene/HCTZ 37.5/25 mg daily   Objective:  Lab Results  Component Value Date   HGBA1C 12.8 (A) 10/29/2022    Lab Results  Component Value Date   CREATININE 0.68 10/29/2022   BUN 7 10/29/2022   NA 135 10/29/2022   K 4.0 10/29/2022   CL 100 10/29/2022   CO2 21 10/29/2022    Lab Results  Component Value Date   CHOL 149 10/29/2022   HDL  35 (L) 10/29/2022   LDLCALC 89 10/29/2022   TRIG 138 10/29/2022   CHOLHDL 4.3 10/29/2022    Medications Reviewed Today     Reviewed by Alden Hipp, RPH-CPP (Pharmacist) on 12/31/22 at 1131  Med List Status: <None>   Medication Order Taking? Sig Documenting Provider Last Dose Status Informant  atorvastatin (LIPITOR) 10 MG tablet [id number] Yes Take 1 tablet (10 mg total) by mouth daily. Ivonne Andrew, NP Taking Active   blood glucose meter kit and supplies KIT [id number]  Dispense based on patient and insurance preference. Use up to four times daily as directed. (FOR ICD-9 250.00, 250.01). Ivonne Andrew, NP  Active   Blood Glucose Monitoring Suppl [facility] BLOOD GLUCOSE METER) w/Device KIT [id number]  1 applicator by Does not apply route 4 (four) times daily -  before meals and at bedtime. Kallie Locks, FNP  Active Self  Blood Pressure Monitoring (BLOOD PRESSURE MONITOR AUTOMAT) DEVI [id number]  1 kit by Does not apply route daily. Barbette Merino, NP  Active   clotrimazole (GYNE-LOTRIMIN) 1 % vaginal cream [id number]  Place 1 Applicatorful vaginally at bedtime. Crain, Whitney L, PA  Active   fluconazole (DIFLUCAN) 150 MG tablet [id number]  Take one tab PO now. May repeat in 72 hours if needed Ivonne Andrew, NP  Active   glipiZIDE (GLUCOTROL) 5 MG tablet [id number] Yes Take 1 tablet (5 mg total) by mouth 2 (two) times daily before a meal. Ivonne Andrew, NP Taking Active   glucose blood test strip [id number]  Use as instructed Ivonne Andrew, NP  Active   metFORMIN (GLUCOPHAGE-XR) 500 MG 24  hr tablet [id number] Yes Take 1 tablet (500 mg total) by mouth 2 (two) times daily with a meal. Ivonne Andrew, NP Taking Active   Semaglutide,0.25 or 0.5MG /DOS, (OZEMPIC, 0.25 OR 0.5 MG/DOSE,) 2 MG/3ML SOPN [id number] Yes Inject 0.25 mg weekly for 4 weeks then increase to 0.5 mg weekly Ivonne Andrew, NP Taking Active   triamterene-hydrochlorothiazide (DYAZIDE) 37.5-25 MG capsule [id number]  Yes TAKE 1 EACH (1 CAPSULE TOTAL) BY MOUTH DAILY. Ivonne Andrew, NP Taking Active               Assessment/Plan:   Diabetes: - Currently uncontrolled but anticipate improvement - Reviewed goal A1c, goal fasting, and goal 2 hour post prandial glucose - Recommend to continue current regimen. If next A1c is not at goal <7%, recommend to increase 1 mg weekly; however, could also be appropriate to increase to assist with weight management. Encouraged patient to discuss with PCP  - Recommend to check glucose periodically, alternating between fasting and 2 hour post prandial  Hypertension: - Currently controlled - Recommend to check UACR    Follow Up Plan: phone call in ~5 weeks  Catie Eppie Gibson, PharmD, BCACP, CPP [facility] [phone]

## 2023-03-04 NOTE — Patient Instructions (Signed)
Grace Daniels,   It was great talking with you!  Continue your current medications. Consider talking to [facility] about increasing your Ozempic dose next week.   Check your blood sugars periodically, alternating between:  1) Fasting, first thing in the morning before breakfast and  2) 2 hours after your largest meal.   For a goal A1c of less than 7%, goal fasting readings are less than 130 and goal 2 hour after meal readings are less than 180.     Take care!  Catie Eppie Gibson, PharmD, BCACP, CPP [facility] [phone]

## 2023-03-11 ENCOUNTER — Ambulatory Visit (INDEPENDENT_AMBULATORY_CARE_PROVIDER_SITE_OTHER): Payer: Federal, State, Local not specified - PPO | Admitting: Nurse Practitioner

## 2023-03-11 ENCOUNTER — Encounter: Payer: Self-pay | Admitting: Nurse Practitioner

## 2023-03-11 VITALS — BP 132/89 | HR 96 | Temp 97.7°F | Ht 70.0 in | Wt 334.2 lb

## 2023-03-11 DIAGNOSIS — R7309 Other abnormal glucose: Secondary | ICD-10-CM

## 2023-03-11 DIAGNOSIS — N898 Other specified noninflammatory disorders of vagina: Secondary | ICD-10-CM

## 2023-03-11 LAB — POCT GLYCOSYLATED HEMOGLOBIN (HGB A1C): Hemoglobin A1C: 10.5 % — AB (ref 4.0–5.6)

## 2023-03-11 MED ORDER — FLUCONAZOLE 150 MG PO TABS: ORAL_TABLET | ORAL | 0 refills | Status: DC

## 2023-03-11 MED ORDER — SEMAGLUTIDE (1 MG/DOSE) 4 MG/3ML ~~LOC~~ SOPN: 1.0000 mg | PEN_INJECTOR | SUBCUTANEOUS | 4 refills | Status: DC

## 2023-03-11 NOTE — Progress Notes (Signed)
  ID: Grace Daniels, female    DOB: Apr 11, 1991, 32 y.o.   MRN: [id number]  Chief Complaint  Patient presents with   Follow-up    Referring provider: Ivonne Andrew, NP   HPI  Grace Daniels presents for follow up. A former patient of NP [place]. She  has a past medical history of Coronavirus infection (11/2020), Diabetes (HCC), Hyperlipidemia, Hypertension, Morbid obesity (HCC), Right knee pain (05/2020), Right thigh pain (05/2020), Sleep apnea, and Vitamin D deficiency (01/2021).     Patient presents today for a follow up visit. Overall she has been doing well.  Patient's hemoglobin A1c was down to 10.5 today compared to 12.8 at last visit. Pharmacy has been consulted for diabetic medication management. Will increase dosage of ozempic today. Patient is losing weight. Has started a walking routine. Denies f/c/s, n/v/d, hemoptysis, PND, leg swelling Denies chest pain or edema       No Known Allergies  Immunization History  Administered Date(s) Administered   Tdap 03/26/2022    Past Medical History:  Diagnosis Date   Coronavirus infection 11/2020   Diabetes    Hyperlipidemia    Hypertension    Morbid obesity    Right knee pain 05/2020   Right thigh pain 05/2020   Sleep apnea    Vitamin D deficiency 01/2021    Tobacco History: Social History   Tobacco Use  Smoking Status Never   Passive exposure: Never  Smokeless Tobacco Never   Counseling given: Not Answered   Outpatient Encounter Medications as of 03/11/2023  Medication Sig   atorvastatin (LIPITOR) 10 MG tablet Take 1 tablet (10 mg total) by mouth daily.   blood glucose meter kit and supplies KIT Dispense based on patient and insurance preference. Use up to four times daily as directed. (FOR ICD-9 250.00, 250.01).   Blood Glucose Monitoring Suppl (ACURA BLOOD GLUCOSE METER) w/Device KIT 1 applicator by Does not apply route 4 (four) times daily -  before meals and at bedtime.   glipiZIDE (GLUCOTROL)  10 MG tablet Take 10 mg by mouth 2 (two) times daily.   glucose blood test strip Use as instructed   metFORMIN (GLUCOPHAGE-XR) 500 MG 24 hr tablet Take 2 tablets (1,000 mg total) by mouth 2 (two) times daily with a meal.   Semaglutide, 1 MG/DOSE, 4 MG/3ML SOPN Inject 1 mg as directed once a week.   triamterene-hydrochlorothiazide (DYAZIDE) 37.5-25 MG capsule TAKE 1 EACH (1 CAPSULE TOTAL) BY MOUTH DAILY.   [DISCONTINUED] Semaglutide,0.25 or 0.5MG /DOS, (OZEMPIC, 0.25 OR 0.5 MG/DOSE,) 2 MG/3ML SOPN Inject 0.5 mg into the skin once a week. Inject 0.25 mg weekly for 4 weeks then increase to 0.5 mg weekly   Blood Pressure Monitoring (BLOOD PRESSURE MONITOR AUTOMAT) DEVI 1 kit by Does not apply route daily. (Patient not taking: Reported on 03/11/2023)   clotrimazole (GYNE-LOTRIMIN) 1 % vaginal cream Place 1 Applicatorful vaginally at bedtime. (Patient not taking: Reported on 03/11/2023)   fluconazole (DIFLUCAN) 150 MG tablet Take one tab PO now. May repeat in 72 hours if needed   [DISCONTINUED] fluconazole (DIFLUCAN) 150 MG tablet Take one tab PO now. May repeat in 72 hours if needed (Patient not taking: Reported on 03/11/2023)   No facility-administered encounter medications on file as of 03/11/2023.     Review of Systems  Review of Systems  Constitutional: Negative.   HENT: Negative.    Cardiovascular: Negative.   Gastrointestinal: Negative.   Allergic/Immunologic: Negative.   Neurological: Negative.   Psychiatric/Behavioral:  Negative.         Physical Exam  BP 132/89   Pulse 96   Temp 97.7 F (36.5 C)   Ht  (1.778 m)   Wt (!) 334 lb 3.2 oz (151.6 kg)   LMP 01/18/2023 (Approximate)   SpO2 97%   BMI 47.95 kg/m   Wt Readings from Last 5 Encounters:  03/11/23 (!) 334 lb 3.2 oz (151.6 kg)  10/29/22 (!) 341 lb (154.7 kg)  07/23/22 (!) 353 lb 12.8 oz (160.5 kg)  06/29/22 (!) 340 lb (154.2 kg)  03/26/22 (!) 347 lb (157.4 kg)     Physical Exam Vitals and nursing note  reviewed.  Constitutional:      General: She is not in acute distress.    Appearance: She is well-developed.  Cardiovascular:     Rate and Rhythm: Normal rate and regular rhythm.  Pulmonary:     Effort: Pulmonary effort is normal.     Breath sounds: Normal breath sounds.  Neurological:     Mental Status: She is alert and oriented to person, place, and time.      Lab Results:  CBC    Component Value Date/Time   WBC 9.2 10/29/2022 1133   WBC 9.9 12/12/2021 2341   RBC 5.51 (H) 10/29/2022 1133   RBC 5.89 (H) 12/12/2021 2341   HGB 11.8 10/29/2022 1133   HCT 38.7 10/29/2022 1133   PLT 369 10/29/2022 1133   MCV 70 (L) 10/29/2022 1133   MCH 21.4 (L) 10/29/2022 1133   MCH 20.4 (L) 12/12/2021 2341   MCHC 30.5 (L) 10/29/2022 1133   MCHC 31.4 12/12/2021 2341   RDW 16.6 (H) 10/29/2022 1133   LYMPHSABS 4.5 (H) 12/12/2021 2341   LYMPHSABS 4.4 (H) 01/21/2021 0935   MONOABS 0.7 12/12/2021 2341   EOSABS 0.1 12/12/2021 2341   EOSABS 0.1 01/21/2021 0935   BASOSABS 0.1 12/12/2021 2341   BASOSABS 0.1 01/21/2021 0935    BMET    Component Value Date/Time   NA 135 10/29/2022 1133   K 4.0 10/29/2022 1133   CL 100 10/29/2022 1133   CO2 21 10/29/2022 1133   GLUCOSE 307 (H) 10/29/2022 1133   GLUCOSE 270 (H) 12/12/2021 2341   BUN 7 10/29/2022 1133   CREATININE 0.68 10/29/2022 1133   CREATININE 0.70 04/26/2017 1133   CALCIUM 9.6 10/29/2022 1133   GFRNONAA >60 12/12/2021 2341   GFRNONAA >89 04/26/2017 1133   GFRAA >60 04/27/2020 2101   GFRAA >89 04/26/2017 1133     Assessment & Plan:   Hemoglobin A1C greater than 9%, indicating poor diabetic control - POCT glycosylated hemoglobin (Hb A1C) - Microalbumin/Creatinine Ratio, Urine - Semaglutide, 1 MG/DOSE, 4 MG/3ML SOPN; Inject 1 mg as directed once a week.  Dispense: 3 mL; Refill: 4    2. Vaginal irritation  - NuSwab Vaginitis Plus (VG+)    Follow up:  Follow up 3 months     Ivonne Andrew, NP 03/11/2023

## 2023-03-11 NOTE — Patient Instructions (Addendum)
1. Hemoglobin A1C greater than 9%, indicating poor diabetic control  - POCT glycosylated hemoglobin (Hb A1C) - Microalbumin/Creatinine Ratio, Urine - Semaglutide, 1 MG/DOSE, 4 MG/3ML SOPN; Inject 1 mg as directed once a week.  Dispense: 3 mL; Refill: 4    2. Vaginal irritation  - NuSwab Vaginitis Plus (VG+)    Follow up:  Follow up 3 months

## 2023-03-11 NOTE — Assessment & Plan Note (Signed)
-   POCT glycosylated hemoglobin (Hb A1C) - Microalbumin/Creatinine Ratio, Urine - Semaglutide, 1 MG/DOSE, 4 MG/3ML SOPN; Inject 1 mg as directed once a week.  Dispense: 3 mL; Refill: 4    2. Vaginal irritation  - NuSwab Vaginitis Plus (VG+)    Follow up:  Follow up 3 months

## 2023-03-12 LAB — MICROALBUMIN / CREATININE URINE RATIO
Creatinine, Urine: 143.4 mg/dL
Microalb/Creat Ratio: 17 mg/g creat (ref 0–29)
Microalbumin, Urine: 23.8 ug/mL

## 2023-03-15 LAB — NUSWAB VAGINITIS PLUS (VG+)
Candida albicans, NAA: POSITIVE — AB
Candida glabrata, NAA: NEGATIVE
Chlamydia trachomatis, NAA: NEGATIVE
Megasphaera 1: HIGH Score — AB
Neisseria gonorrhoeae, NAA: NEGATIVE
Trich vag by NAA: NEGATIVE

## 2023-03-17 ENCOUNTER — Other Ambulatory Visit: Payer: Self-pay | Admitting: Nurse Practitioner

## 2023-03-17 MED ORDER — METRONIDAZOLE 500 MG PO TABS: 500.0000 mg | ORAL_TABLET | Freq: Two times a day (BID) | ORAL | 0 refills | Status: AC

## 2023-04-01 ENCOUNTER — Other Ambulatory Visit: Payer: Federal, State, Local not specified - PPO | Admitting: Pharmacist

## 2023-04-01 MED ORDER — GLIPIZIDE 5 MG PO TABS: 5.0000 mg | ORAL_TABLET | Freq: Every day | ORAL | 1 refills | Status: DC

## 2023-04-01 MED ORDER — FREESTYLE LIBRE 3 SENSOR MISC: 11 refills | Status: DC

## 2023-04-01 NOTE — Progress Notes (Signed)
04/01/2023 Name: Grace Daniels MRN: [id number] DOB: 1991/04/05  Chief Complaint  Patient presents with   Medication Management   Diabetes    Grace Daniels is a 32 y.o. year old female who presented for a telephone visit.   They were referred to the pharmacist by their PCP for assistance in managing diabetes.   Subjective:  Care Team: Primary Care Provider: Ivonne Andrew, NP ; Next Scheduled Visit: 06/10/23  Medication Access/Adherence  Current Pharmacy:  [id number] [place], [place] - [address] AT [facility] [address] [place] [address] Phone: [phone] Fax: [phone]  [facility] - [facility] [address], [address] [place] [address] Phone: [phone] Fax: [phone]   Patient reports affordability concerns with their medications: No  Patient reports access/transportation concerns to their pharmacy: No  Patient reports adherence concerns with their medications:  No     Diabetes:  Current medications: metformin XR 1000 mg twice daily, Ozempic 1 mg weekly, glipizide 10 mg daily  Current glucose readings: has not checked any readings since increasing Ozempic dose    Patient denies hypoglycemic s/sx including dizziness, shakiness, sweating. Patient denies hyperglycemic symptoms including polyuria, polydipsia, polyphagia, nocturia, neuropathy, blurred vision.   Objective:  Lab Results  Component Value Date   HGBA1C 10.5 (A) 03/11/2023    Lab Results  Component Value Date   CREATININE 0.68 10/29/2022   BUN 7 10/29/2022   NA 135 10/29/2022   K 4.0 10/29/2022   CL 100 10/29/2022   CO2 21 10/29/2022    Lab Results  Component Value Date   CHOL 149 10/29/2022   HDL 35 (L) 10/29/2022   LDLCALC 89 10/29/2022   TRIG 138 10/29/2022   CHOLHDL 4.3 10/29/2022    Medications Reviewed Today     Reviewed by Alden Hipp, RPH-CPP (Pharmacist) on 04/01/23 at  1120  Med List Status: <None>   Medication Order Taking? Sig Documenting Provider Last Dose Status Informant  atorvastatin (LIPITOR) 10 MG tablet [id number]  Take 1 tablet (10 mg total) by mouth daily. Ivonne Andrew, NP  Active   blood glucose meter kit and supplies KIT [id number] Yes Dispense based on patient and insurance preference. Use up to four times daily as directed. (FOR ICD-9 250.00, 250.01). Ivonne Andrew, NP Taking Active   Blood Glucose Monitoring Suppl [facility] BLOOD GLUCOSE METER) w/Device KIT [id number] Yes 1 applicator by Does not apply route 4 (four) times daily -  before meals and at bedtime. Kallie Locks, FNP Taking Active Self  Blood Pressure Monitoring (BLOOD PRESSURE MONITOR AUTOMAT) DEVI [id number] No 1 kit by Does not apply route daily.  Patient not taking: Reported on 03/11/2023   Barbette Merino, NP Not Taking Active   clotrimazole (GYNE-LOTRIMIN) 1 % vaginal cream [id number] No Place 1 Applicatorful vaginally at bedtime.  Patient not taking: Reported on 03/11/2023   Maretta Bees, [place] Not Taking Active   glipiZIDE (GLUCOTROL) 10 MG tablet [id number] Yes Take 10 mg by mouth daily. [provider] Taking Active   glucose blood test strip [id number] No Use as instructed  Patient not taking: Reported on 04/01/2023   Ivonne Andrew, NP Not Taking Active   metFORMIN (GLUCOPHAGE-XR) 500 MG 24 hr tablet [id number] Yes Take 2 tablets (1,000 mg total) by mouth 2 (two) times daily with a meal. Ivonne Andrew, NP Taking Active   Semaglutide, 1 MG/DOSE, 4 MG/3ML SOPN  [id number] Yes Inject 1 mg as directed once a week. Ivonne Andrew, NP Taking Active   triamterene-hydrochlorothiazide (DYAZIDE) 37.5-25 MG capsule [id number] No TAKE 1 EACH (1 CAPSULE TOTAL) BY MOUTH DAILY.  Patient not taking: Reported on 04/01/2023   Ivonne Andrew, NP Not Taking Active               Assessment/Plan:   Diabetes: - Currently uncontrolled - Discussed use of CGM to help with  more comprehensive glucose management. Patient verbalizes interest if covered. Script sent under system wide standing order. Copay is $74.99 for 1 month supply. Patient will pick up once in stock and try for a month.    Follow Up Plan: 4 weeks  Catie Eppie Gibson, PharmD, BCACP, CPP [facility] [phone]

## 2023-04-02 ENCOUNTER — Encounter: Payer: Self-pay | Admitting: Pharmacist

## 2023-04-20 ENCOUNTER — Other Ambulatory Visit: Payer: Self-pay | Admitting: Nurse Practitioner

## 2023-04-20 DIAGNOSIS — I10 Essential (primary) hypertension: Secondary | ICD-10-CM

## 2023-04-20 DIAGNOSIS — E119 Type 2 diabetes mellitus without complications: Secondary | ICD-10-CM

## 2023-04-27 ENCOUNTER — Encounter: Payer: Self-pay | Admitting: Pharmacist

## 2023-04-29 ENCOUNTER — Ambulatory Visit (INDEPENDENT_AMBULATORY_CARE_PROVIDER_SITE_OTHER): Payer: Federal, State, Local not specified - PPO

## 2023-04-29 DIAGNOSIS — R319 Hematuria, unspecified: Secondary | ICD-10-CM | POA: Diagnosis not present

## 2023-04-29 DIAGNOSIS — N898 Other specified noninflammatory disorders of vagina: Secondary | ICD-10-CM

## 2023-04-29 LAB — POCT URINALYSIS DIP (PROADVANTAGE DEVICE)
Bilirubin, UA: NEGATIVE
Glucose, UA: 250 mg/dL — AB
Ketones, POC UA: NEGATIVE mg/dL
Leukocytes, UA: NEGATIVE
Nitrite, UA: NEGATIVE
Protein Ur, POC: 30 mg/dL — AB
Specific Gravity, Urine: >=1.03
Urobilinogen, Ur: 1
pH, UA: 5.5 (ref 5.0–8.0)

## 2023-04-29 NOTE — Progress Notes (Signed)
Pt called and advised that she was having vaginal irritations.  Per Archie Patten pt was scheduled to have U/A and Nu swab. Results were given to provider  and patient will be advised per provider.   Renelda Loma RMA

## 2023-05-01 LAB — URINE CULTURE

## 2023-05-02 LAB — NUSWAB VAGINITIS PLUS (VG+)

## 2023-05-03 ENCOUNTER — Other Ambulatory Visit: Payer: Self-pay | Admitting: Nurse Practitioner

## 2023-05-03 ENCOUNTER — Other Ambulatory Visit: Payer: Federal, State, Local not specified - PPO | Admitting: Pharmacist

## 2023-05-03 LAB — NUSWAB VAGINITIS PLUS (VG+)
Candida albicans, NAA: POSITIVE — AB
Chlamydia trachomatis, NAA: NEGATIVE
Neisseria gonorrhoeae, NAA: NEGATIVE
Trich vag by NAA: NEGATIVE

## 2023-05-03 MED ORDER — AMOXICILLIN-POT CLAVULANATE 875-125 MG PO TABS: 1.0000 | ORAL_TABLET | Freq: Two times a day (BID) | ORAL | 0 refills | Status: AC

## 2023-05-03 MED ORDER — FLUCONAZOLE 150 MG PO TABS: 150.0000 mg | ORAL_TABLET | Freq: Every day | ORAL | 0 refills | Status: DC

## 2023-05-04 LAB — URINE CULTURE

## 2023-05-12 ENCOUNTER — Other Ambulatory Visit: Payer: Federal, State, Local not specified - PPO | Admitting: Pharmacist

## 2023-05-13 ENCOUNTER — Other Ambulatory Visit: Payer: Federal, State, Local not specified - PPO | Admitting: Pharmacist

## 2023-05-13 ENCOUNTER — Telehealth: Payer: Self-pay | Admitting: Pharmacist

## 2023-05-13 NOTE — Progress Notes (Signed)
Attempted to contact patient for scheduled appointment for medication management. Left HIPAA compliant message for patient to return my call at their convenience.   Will send MyChart  Catie T. Hedwig Mcfall, PharmD, BCACP, CPP Clinical Pharmacist [facility] [phone]  

## 2023-06-06 ENCOUNTER — Other Ambulatory Visit: Payer: Self-pay

## 2023-06-06 ENCOUNTER — Emergency Department (HOSPITAL_BASED_OUTPATIENT_CLINIC_OR_DEPARTMENT_OTHER)
Admission: EM | Admit: 2023-06-06 | Discharge: 2023-06-06 | Disposition: A | Discharge status: Home / Self Care | Payer: Federal, State, Local not specified - PPO | Attending: Emergency Medicine | Admitting: Emergency Medicine

## 2023-06-06 ENCOUNTER — Encounter (HOSPITAL_BASED_OUTPATIENT_CLINIC_OR_DEPARTMENT_OTHER): Payer: Self-pay | Admitting: Urology

## 2023-06-06 DIAGNOSIS — Z7984 Long term (current) use of oral hypoglycemic drugs: Secondary | ICD-10-CM | POA: Insufficient documentation

## 2023-06-06 DIAGNOSIS — Z8616 Personal history of COVID-19: Secondary | ICD-10-CM | POA: Insufficient documentation

## 2023-06-06 DIAGNOSIS — R300 Dysuria: Secondary | ICD-10-CM | POA: Diagnosis not present

## 2023-06-06 DIAGNOSIS — L308 Other specified dermatitis: Secondary | ICD-10-CM | POA: Diagnosis not present

## 2023-06-06 DIAGNOSIS — I10 Essential (primary) hypertension: Secondary | ICD-10-CM | POA: Diagnosis not present

## 2023-06-06 DIAGNOSIS — B3731 Acute candidiasis of vulva and vagina: Secondary | ICD-10-CM | POA: Diagnosis not present

## 2023-06-06 DIAGNOSIS — Z79899 Other long term (current) drug therapy: Secondary | ICD-10-CM | POA: Diagnosis not present

## 2023-06-06 DIAGNOSIS — B372 Candidiasis of skin and nail: Secondary | ICD-10-CM

## 2023-06-06 DIAGNOSIS — E1165 Type 2 diabetes mellitus with hyperglycemia: Secondary | ICD-10-CM | POA: Insufficient documentation

## 2023-06-06 LAB — URINALYSIS, ROUTINE W REFLEX MICROSCOPIC
Bilirubin Urine: NEGATIVE
Glucose, UA: 100 mg/dL — AB
Ketones, ur: NEGATIVE mg/dL
Nitrite: NEGATIVE
Protein, ur: 30 mg/dL — AB
Specific Gravity, Urine: >=1.03 (ref 1.005–1.030)
pH: 5.5 (ref 5.0–8.0)

## 2023-06-06 LAB — CBG MONITORING, ED: Glucose-Capillary: 272 mg/dL — ABNORMAL HIGH (ref 70–99)

## 2023-06-06 LAB — URINALYSIS, MICROSCOPIC (REFLEX)

## 2023-06-06 LAB — WET PREP, GENITAL
Clue Cells Wet Prep HPF POC: NONE SEEN
Sperm: NONE SEEN
Trich, Wet Prep: NONE SEEN
WBC, Wet Prep HPF POC: <10 (ref <10)
Yeast Wet Prep HPF POC: NONE SEEN

## 2023-06-06 LAB — PREGNANCY, URINE: Preg Test, Ur: NEGATIVE

## 2023-06-06 MED ORDER — FLUCONAZOLE 150 MG PO TABS: 150.0000 mg | ORAL_TABLET | ORAL | 0 refills | Status: AC

## 2023-06-06 NOTE — ED Provider Notes (Signed)
[facility] Provider Note   CSN: [id number] Arrival date & time: 06/06/23  1451     History  Chief Complaint  Patient presents with   Dysuria    Grace Daniels is a 32 y.o. female.  HPI     32 year old female with history of hypertension, hyperlipidemia, diabetes, OSA, presents with concern for vaginal discharge, dysuria.  2 weeks ago began to have symptoms again 4 weeks ago, saw PCP with symptoms, given antibiotic  Burning, itching, discharge, odor Changed soap to see if that would change things but didn't No abdominal pain No nausea, vomiting, constipation, fevers Intermittent diarrhea thinks from medicine Also having frequency  Taking medications for DM, has not checked glucose    Past Medical History:  Diagnosis Date   Coronavirus infection 11/2020   Diabetes (HCC)    Hyperlipidemia    Hypertension    Morbid obesity (HCC)    Right knee pain 05/2020   Right thigh pain 05/2020   Sleep apnea    Vitamin D deficiency 01/2021      Home Medications Prior to Admission medications   Medication Sig Start Date End Date Taking? Authorizing Provider  fluconazole (DIFLUCAN) 150 MG tablet Take 1 tablet (150 mg total) by mouth every 3 (three) days for 3 doses. 06/06/23 06/13/23 Yes Alvira Monday, MD  atorvastatin (LIPITOR) 10 MG tablet Take 1 tablet (10 mg total) by mouth daily. 10/29/22   Ivonne Andrew, NP  blood glucose meter kit and supplies KIT Dispense based on patient and insurance preference. Use up to four times daily as directed. (FOR ICD-9 250.00, 250.01). 06/12/22   Ivonne Andrew, NP  Blood Glucose Monitoring Suppl [facility] BLOOD GLUCOSE METER) w/Device KIT 1 applicator by Does not apply route 4 (four) times daily -  before meals and at bedtime. 05/16/18   Kallie Locks, FNP  Blood Pressure Monitoring (BLOOD PRESSURE MONITOR AUTOMAT) DEVI 1 kit by Does not apply route daily. Patient not taking: Reported on  03/11/2023 04/25/21   Barbette Merino, NP  clotrimazole (GYNE-LOTRIMIN) 1 % vaginal cream Place 1 Applicatorful vaginally at bedtime. Patient not taking: Reported on 03/11/2023 09/01/22   Guy Sandifer L, PA  Continuous Glucose Sensor (FREESTYLE LIBRE 3 SENSOR) MISC Place 1 sensor on the skin every 14 days. Use to check glucose continuously 04/01/23   Ivonne Andrew, NP  glipiZIDE (GLUCOTROL) 5 MG tablet Take 1 tablet (5 mg total) by mouth daily. 04/01/23   Ivonne Andrew, NP  glucose blood test strip Use as instructed Patient not taking: Reported on 04/01/2023 03/30/22   Ivonne Andrew, NP  metFORMIN (GLUCOPHAGE-XR) 500 MG 24 hr tablet Take 2 tablets (1,000 mg total) by mouth 2 (two) times daily with a meal. 12/31/22   Ivonne Andrew, NP  Semaglutide, 1 MG/DOSE, 4 MG/3ML SOPN Inject 1 mg as directed once a week. 03/11/23   Ivonne Andrew, NP  triamterene-hydrochlorothiazide (DYAZIDE) 37.5-25 MG capsule TAKE 1 EACH (1 CAPSULE TOTAL) BY MOUTH DAILY. 04/20/23   Ivonne Andrew, NP      Allergies    Patient has no known allergies.    Review of Systems   Review of Systems  Physical Exam Updated Vital Signs BP (!) 134/92 (BP Location: Right Arm)   Pulse 100   Temp 97.6 F (36.4 C) (Oral)   Resp 18   Ht 5\' 10"  (1.778 m)   Wt (!) 149.7 kg   SpO2 94%  BMI 47.35 kg/m  Physical Exam Vitals and nursing note reviewed.  Constitutional:      General: She is not in acute distress.    Appearance: She is well-developed. She is not diaphoretic.  HENT:     Head: Normocephalic and atraumatic.  Eyes:     Conjunctiva/sclera: Conjunctivae normal.  Cardiovascular:     Rate and Rhythm: Normal rate and regular rhythm.     Pulses: Normal pulses.  Pulmonary:     Effort: Pulmonary effort is normal.  Abdominal:     General: There is no distension.     Palpations: Abdomen is soft.     Tenderness: There is no abdominal tenderness. There is no guarding.  Genitourinary:    Vagina: Vaginal discharge  present.     Comments: Erythema over perineum, areas of thickened skin over vulva, fissures in crease on right side, tenderness present over areas and vaginal area Musculoskeletal:        General: No tenderness.     Cervical back: Normal range of motion.  Skin:    General: Skin is warm and dry.     Findings: No erythema or rash.  Neurological:     Mental Status: She is alert and oriented to person, place, and time.     ED Results / Procedures / Treatments   Labs (all labs ordered are listed, but only abnormal results are displayed) Labs Reviewed  URINALYSIS, ROUTINE W REFLEX MICROSCOPIC - Abnormal; Notable for the following components:      Result Value   Glucose, UA 100 (*)    Hgb urine dipstick TRACE (*)    Protein, ur 30 (*)    Leukocytes,Ua SMALL (*)    All other components within normal limits  URINALYSIS, MICROSCOPIC (REFLEX) - Abnormal; Notable for the following components:   Bacteria, UA FEW (*)    All other components within normal limits  CBG MONITORING, ED - Abnormal; Notable for the following components:   Glucose-Capillary 272 (*)    All other components within normal limits  WET PREP, GENITAL  PREGNANCY, URINE  GC/CHLAMYDIA PROBE AMP ([place]) NOT AT [facility]    EKG None  Radiology No results found.  Procedures Procedures    Medications Ordered in ED Medications - No data to display  ED Course/ Medical Decision Making/ A&P                               32 year old female with history of hypertension, hyperlipidemia, diabetes, OSA, presents with concern for vaginal discharge, dysuria.  Urinalysis performed and shows no evidence of urinary tract infection.  Pregnancy test negative.  They do not have abdominal pain, low suspicion for PID, ovarian torsion, TOA, or other acute intra-abdominal process.  Wet prep without abnormalities.  Glucose is elevated to 272, however no ketones in the urine or systemic symptoms.  She is noted to have a rash on exam,  most consistent with candidal infection.  Area on exam and history is not consistent with Fornier's Gangrene.   Will treat with antifungal q72 hr x 3 doses.  Recommend follow up with OBGYN.  Patient discharged in stable condition with understanding of reasons to return.         Final Clinical Impression(s) / ED Diagnoses Final diagnoses:  Candidal dermatitis  Dysuria    Rx / DC Orders ED Discharge Orders          Ordered    fluconazole (  DIFLUCAN) 150 MG tablet  every 72 hours        06/06/23 1603              Alvira Monday, MD 06/06/23 1607

## 2023-06-06 NOTE — ED Triage Notes (Signed)
Pt states burning with urination and frequency and urgency x 2 weeks  Also states vaginal discharge and itching as well  Denies any fever

## 2023-06-07 LAB — GC/CHLAMYDIA PROBE AMP (~~LOC~~) NOT AT ARMC
Chlamydia: NEGATIVE
Comment: NEGATIVE
Comment: NORMAL
Neisseria Gonorrhea: NEGATIVE

## 2023-06-09 ENCOUNTER — Telehealth: Payer: Self-pay

## 2023-06-09 ENCOUNTER — Other Ambulatory Visit: Payer: Self-pay

## 2023-06-09 NOTE — Telephone Encounter (Signed)
A prior authorization request for Ozempic has been submitted to insurance today via CoverMyMeds Key: [id number]

## 2023-06-10 ENCOUNTER — Ambulatory Visit: Payer: Self-pay | Admitting: Nurse Practitioner

## 2023-06-23 ENCOUNTER — Other Ambulatory Visit: Payer: Federal, State, Local not specified - PPO | Admitting: Pharmacist

## 2023-06-23 DIAGNOSIS — R7309 Other abnormal glucose: Secondary | ICD-10-CM

## 2023-06-23 NOTE — Progress Notes (Unsigned)
06/23/2023 Name: Grace Daniels MRN: [id number] DOB: [birth year]  Chief Complaint  Patient presents with   Diabetes    Grace Daniels is a 32 y.o. year old female who presented for a telephone visit.   They were referred to the pharmacist by their PCP for assistance in managing diabetes.   Subjective:  Care Team: Primary Care Provider: Ivonne Andrew, NP ; No scheduled visit.   Medication Access/Adherence  Current Pharmacy:  [id number] [place], [place] - [address] AT [facility] [address] [place] [place] [address] Phone: [phone] Fax: [phone]  [facility] - [facility] [address] [place] [address] Phone: [phone] Fax: [phone]   Patient reports affordability concerns with their medications: No . Initially Ozempic was $25, then was $60. Educated pt on Ozempic coupon card.  Patient reports access/transportation concerns to their pharmacy: No  Patient reports adherence concerns with their medications:  Yes  Out of Ozempic for past 2 months.    Diabetes:  Current medications: metformin 1000 mg BID (questionable per dispense report), glipizide 5 mg daily (10 mg tablet last filled on dispense report)  Medications tried in the past: dapagliflozin, dulaglutide, semaglutide 1 mg (recently ran out for 2 mo)  Current glucose readings: not checking BG at home. Does have BG testing supplies. Still expresses interest in trying Freestyle Libre 3 for ~1 mo. Resend instructions for [employer]    Patient denies hypoglycemic s/sx including dizziness, shakiness, sweating. Patient denies hyperglycemic symptoms including polyuria, polydipsia, polyphagia, nocturia, neuropathy, blurred vision.  Current meal patterns: Eats 2 meals a day. Usually has breakfast and dinner.  - Breakfast: varies - Supper: Mix of fast food and cooked meals.   Current physical activity: Walks at work. Has gym at  new apartment - has not been yet but discussed setting goal of starting to go once a week or once every other week.   Current medication access support: none  Objective:  Lab Results  Component Value Date   HGBA1C 10.5 (A) 03/11/2023    Lab Results  Component Value Date   CREATININE 0.68 10/29/2022   BUN 7 10/29/2022   NA 135 10/29/2022   K 4.0 10/29/2022   CL 100 10/29/2022   CO2 21 10/29/2022    Lab Results  Component Value Date   CHOL 149 10/29/2022   HDL 35 (L) 10/29/2022   LDLCALC 89 10/29/2022   TRIG 138 10/29/2022   CHOLHDL 4.3 10/29/2022    Medications Reviewed Today     Reviewed by Particia Lather, RPH (Pharmacist) on 06/23/23 at 1148  Med List Status: <None>   Medication Order Taking? Sig Documenting Provider Last Dose Status Informant  atorvastatin (LIPITOR) 10 MG tablet [id number] Yes Take 1 tablet (10 mg total) by mouth daily. Ivonne Andrew, NP Taking Active   blood glucose meter kit and supplies KIT [id number]  Dispense based on patient and insurance preference. Use up to four times daily as directed. (FOR ICD-9 250.00, 250.01). Ivonne Andrew, NP  Active   Blood Glucose Monitoring Suppl [facility] BLOOD GLUCOSE METER) w/Device KIT [id number]  1 applicator by Does not apply route 4 (four) times daily -  before meals and at bedtime. Kallie Locks, FNP  Active Self  Blood Pressure Monitoring (BLOOD PRESSURE MONITOR AUTOMAT) DEVI [id number]  1 kit by Does not apply route daily.  Patient not taking: Reported on 03/11/2023   Barbette Merino,  NP  Active   clotrimazole (GYNE-LOTRIMIN) 1 % vaginal cream [id number]  Place 1 Applicatorful vaginally at bedtime.  Patient not taking: Reported on 03/11/2023   Maretta Bees, [place]  Active   Continuous Glucose Sensor (FREESTYLE LIBRE 3 [place]) [place] [id number]  Place 1 sensor on the skin every 14 days. Use to check glucose continuously Ivonne Andrew, NP  Active   glipiZIDE (GLUCOTROL) 5 MG tablet [id number] Yes Take 1 tablet (5  mg total) by mouth daily. Ivonne Andrew, NP Taking Active   glucose blood test strip [id number]  Use as instructed  Patient not taking: Reported on 04/01/2023   Ivonne Andrew, NP  Active   metFORMIN (GLUCOPHAGE-XR) 500 MG 24 hr tablet [id number] Yes Take 2 tablets (1,000 mg total) by mouth 2 (two) times daily with a meal. Ivonne Andrew, NP Taking Active   Semaglutide, 1 MG/DOSE, 4 MG/3ML SOPN [id number] No Inject 1 mg as directed once a week.  Patient not taking: Reported on 06/23/2023   Ivonne Andrew, NP Not Taking Active            Med Note Particia Lather   Wed Jun 23, 2023  9:52 AM) Has been out for 2 months  triamterene-hydrochlorothiazide (DYAZIDE) 37.5-25 MG capsule [id number] Yes TAKE 1 EACH (1 CAPSULE TOTAL) BY MOUTH DAILY. Ivonne Andrew, NP Taking Active             Assessment/Plan:   Diabetes: - Currently uncontrolled per most recent A1c in Apr 2024 of 10.5% above goal <7%. Pt has not been testing blood sugars, so no additional BG data available. Because pt has been off of semaglutide (Ozempic) 1 mg for >1 month, recommend restarting semaglutide titration at 0.25 mg weekly x 2 weeks followed by 0.5 mg weekly for 4 weeks, then resume 1 mg weekly. Great candidate for GLP-1RA given metabolic syndrome and BMI >45. Recommend continuing other agents (metformin, glipizide), although unsure of adherence given discrepancy between dispense report and patient report. Educated pt on benefit of monitoring BG either by finger stick or CGM. Pt is not currently a candidate for SGLT2i therapy given recent ED visit for candidal infection and A1c >10%.  - Reviewed long term cardiovascular and renal outcomes of uncontrolled blood sugar - Reviewed goal A1c, goal fasting, and goal 2 hour post prandial glucose - Reviewed dietary modifications including increasing water intake, trying to reduce fast food.  - Reviewed lifestyle modifications including: increasing protein intake at each meal. Reduce  intake of foods high in fat or carbohydrates to prevent GI AE with GLP-1.  - Recommend to start Ozempic (semaglutide) 0.25 mg weekly x2 weeks then increase to 0.5 mg weekly x4 weeks. May then reevalulate resuming Ozempic 1 mg weekly. Reviewed potential AE of GLP-1 and appropriate management.  - Recommend continuing metformin 1000 mg BID and glipizide 5 mg daily.  - Recommend to check glucose 1-2 times week via fingerstick or continuously for 1 mo with Freestyle Libre CGM.  - Educated pt on how to use [employer] Card to make copay $25 instead of $60  Follow Up Plan: Pharmacist telephone ~4 weeks.  Nils Pyle, PharmD PGY1 Pharmacy Resident

## 2023-06-24 MED ORDER — SEMAGLUTIDE(0.25 OR 0.5MG/DOS) 2 MG/3ML ~~LOC~~ SOPN
0.5000 mg | PEN_INJECTOR | SUBCUTANEOUS | 0 refills | Status: DC
Start: 2023-06-24 — End: 2023-11-25

## 2023-07-26 ENCOUNTER — Other Ambulatory Visit: Payer: Federal, State, Local not specified - PPO | Admitting: Pharmacist

## 2023-07-29 ENCOUNTER — Telehealth: Payer: Self-pay | Admitting: Pharmacist

## 2023-07-29 ENCOUNTER — Other Ambulatory Visit: Payer: Federal, State, Local not specified - PPO | Admitting: Pharmacist

## 2023-07-29 NOTE — Progress Notes (Signed)
Attempted to contact patient for scheduled appointment for medication management. Left HIPAA compliant message for patient to return my call at their convenience.   Will send MyChart.  Catie T. Harper, PharmD, BCACP, CPP Clinical Pharmacist [facility] [phone]  

## 2023-07-30 ENCOUNTER — Other Ambulatory Visit: Payer: Self-pay | Admitting: Nurse Practitioner

## 2023-07-30 DIAGNOSIS — I10 Essential (primary) hypertension: Secondary | ICD-10-CM

## 2023-07-30 DIAGNOSIS — E119 Type 2 diabetes mellitus without complications: Secondary | ICD-10-CM

## 2023-08-04 ENCOUNTER — Telehealth: Payer: Self-pay

## 2023-08-04 NOTE — Progress Notes (Signed)
Care Coordination Note  08/04/2023 Name: Grace Daniels MRN: [id number] DOB: 02-Apr-1991  Grace Daniels is a 32 y.o. year old female who is a primary care patient of Ivonne Andrew, NP and is actively engaged with the Chronic Care Management team. I reached out to [name] by phone today to assist with re-scheduling a follow up visit with the Pharmacist  Follow up plan: Unsuccessful telephone outreach attempt made. A HIPAA compliant phone message was left for the patient providing contact information and requesting a return call.  If patient returns call to provider office, please advise to call CCM Care Guide Penne Lash  at [phone]  Penne Lash, RMA Care Guide [facility]  [place], [place] [address] Direct Dial: [phone] [email or web address]

## 2023-08-12 NOTE — Progress Notes (Signed)
Care Coordination Note  08/12/2023 Name: Grace Daniels MRN: [id number] DOB: 1991-07-19  Grace Daniels is a 32 y.o. year old female who is a primary care patient of Ivonne Andrew, NP and is actively engaged with the Chronic Care Management team. I reached out to [name] by phone today to assist with re-scheduling a follow up visit with the Pharmacist  Follow up plan: Telephone appointment with care management team member scheduled for:08/26/2023  Penne Lash, RMA Care Guide [facility]  [place], [place] [address] Direct Dial: [phone] [email or web address]

## 2023-08-26 ENCOUNTER — Other Ambulatory Visit: Payer: Federal, State, Local not specified - PPO

## 2023-09-02 ENCOUNTER — Other Ambulatory Visit: Payer: Federal, State, Local not specified - PPO

## 2023-09-02 ENCOUNTER — Telehealth: Payer: Self-pay | Admitting: Pharmacist

## 2023-09-02 NOTE — Progress Notes (Signed)
Attempted to contact patient for scheduled appointment for medication management. Left HIPAA compliant message for patient to return my call at their convenience.   Will send MyChart  Catie Eppie Gibson, PharmD, BCACP, CPP Clinical Pharmacist [facility] [phone]

## 2023-09-07 ENCOUNTER — Telehealth: Payer: Self-pay

## 2023-09-07 NOTE — Progress Notes (Signed)
  Care Coordination Note  09/07/2023 Name: Grace Daniels MRN: [id number] DOB: Feb 22, 1991  Grace Daniels is a 32 y.o. year old female who is a primary care patient of Ivonne Andrew, NP and is actively engaged with the  Care Management team. I reached out to [name] by phone today to assist with re-scheduling a follow up visit with the Pharmacist  Follow up plan: Unsuccessful telephone outreach attempt made. A HIPAA compliant phone message was left for the patient providing contact information and requesting a return call.  If patient returns call to provider office, please advise to call Care Guide Penne Lash  at [phone]  Penne Lash, RMA Care Guide [facility]  [place], [place] [address] Direct Dial: [phone] [email or web address]

## 2023-09-15 NOTE — Progress Notes (Signed)
  Care Coordination Note  09/15/2023 Name: [name] MRN: [id number] DOB: 02/25/1991  Grace Daniels is a 32 y.o. year old female who is a primary care patient of Ivonne Andrew, NP and is actively engaged with the Care Management team. I reached out to [name] by phone today to assist with re-scheduling a follow up visit with the Pharmacist  Follow up plan: Unsuccessful telephone outreach attempt made. A HIPAA compliant phone message was left for the patient providing contact information and requesting a return call.   Penne Lash, RMA Care Guide [facility]  [place], [place] [address] Direct Dial: [phone] [email or web address]

## 2023-10-04 NOTE — Progress Notes (Signed)
  Care Coordination Note  10/04/2023 Name: [name] MRN: [id number] DOB: [birth year]  Grace Daniels is a 32 y.o. year old female who is a primary care patient of Ivonne Andrew, NP and is actively engaged with the Chronic Care Management team. I reached out to [name] by phone today to assist with re-scheduling a follow up visit with the Pharmacist  Follow up plan: Unable to make contact on outreach attempts x 3.   Penne Lash , RMA     [facility]  [facility] Guide  Direct Dial: [phone]  Website: [email or web address]

## 2023-11-02 ENCOUNTER — Other Ambulatory Visit: Payer: Self-pay

## 2023-11-02 ENCOUNTER — Emergency Department (HOSPITAL_BASED_OUTPATIENT_CLINIC_OR_DEPARTMENT_OTHER): Payer: Federal, State, Local not specified - PPO

## 2023-11-02 ENCOUNTER — Emergency Department (HOSPITAL_BASED_OUTPATIENT_CLINIC_OR_DEPARTMENT_OTHER)
Admission: EM | Admit: 2023-11-02 | Discharge: 2023-11-02 | Disposition: A | Discharge status: Home / Self Care | Payer: Federal, State, Local not specified - PPO | Attending: Emergency Medicine | Admitting: Emergency Medicine

## 2023-11-02 DIAGNOSIS — J029 Acute pharyngitis, unspecified: Secondary | ICD-10-CM | POA: Diagnosis not present

## 2023-11-02 DIAGNOSIS — Z1152 Encounter for screening for COVID-19: Secondary | ICD-10-CM | POA: Diagnosis not present

## 2023-11-02 DIAGNOSIS — J069 Acute upper respiratory infection, unspecified: Secondary | ICD-10-CM | POA: Insufficient documentation

## 2023-11-02 DIAGNOSIS — J020 Streptococcal pharyngitis: Secondary | ICD-10-CM | POA: Diagnosis not present

## 2023-11-02 DIAGNOSIS — B9789 Other viral agents as the cause of diseases classified elsewhere: Secondary | ICD-10-CM | POA: Diagnosis not present

## 2023-11-02 DIAGNOSIS — R0602 Shortness of breath: Secondary | ICD-10-CM | POA: Diagnosis not present

## 2023-11-02 LAB — COMPREHENSIVE METABOLIC PANEL
ALT: 12 U/L (ref 0–44)
AST: 12 U/L — ABNORMAL LOW (ref 15–41)
Albumin: 3.7 g/dL (ref 3.5–5.0)
Alkaline Phosphatase: 74 U/L (ref 38–126)
Anion gap: 7 (ref 5–15)
BUN: 12 mg/dL (ref 6–20)
CO2: 22 mmol/L (ref 22–32)
Calcium: 8.9 mg/dL (ref 8.9–10.3)
Chloride: 105 mmol/L (ref 98–111)
Creatinine, Ser: 0.62 mg/dL (ref 0.44–1.00)
GFR, Estimated: >60 mL/min (ref >60)
Glucose, Bld: 280 mg/dL — ABNORMAL HIGH (ref 70–99)
Potassium: 3.8 mmol/L (ref 3.5–5.1)
Sodium: 134 mmol/L — ABNORMAL LOW (ref 135–145)
Total Bilirubin: 0.2 mg/dL (ref <1.2)
Total Protein: 7.9 g/dL (ref 6.5–8.1)

## 2023-11-02 LAB — CBC
HCT: 35.9 % — ABNORMAL LOW (ref 36.0–46.0)
Hemoglobin: 11.4 g/dL — ABNORMAL LOW (ref 12.0–15.0)
MCH: 21 pg — ABNORMAL LOW (ref 26.0–34.0)
MCHC: 31.8 g/dL (ref 30.0–36.0)
MCV: 66 fL — ABNORMAL LOW (ref 80.0–100.0)
Platelets: 432 10*3/uL — ABNORMAL HIGH (ref 150–400)
RBC: 5.44 MIL/uL — ABNORMAL HIGH (ref 3.87–5.11)
RDW: 16.3 % — ABNORMAL HIGH (ref 11.5–15.5)
WBC: 13 10*3/uL — ABNORMAL HIGH (ref 4.0–10.5)
nRBC: 0 % (ref 0.0–0.2)

## 2023-11-02 LAB — RESP PANEL BY RT-PCR (RSV, FLU A&B, COVID)  RVPGX2
Influenza A by PCR: NEGATIVE
Influenza B by PCR: NEGATIVE
Resp Syncytial Virus by PCR: NEGATIVE
SARS Coronavirus 2 by RT PCR: NEGATIVE

## 2023-11-02 LAB — GROUP A STREP BY PCR: Group A Strep by PCR: DETECTED — AB

## 2023-11-02 LAB — PREGNANCY, URINE: Preg Test, Ur: NEGATIVE

## 2023-11-02 MED ORDER — FLUCONAZOLE 150 MG PO TABS: 150.0000 mg | ORAL_TABLET | Freq: Once | ORAL | 0 refills | Status: DC | PRN

## 2023-11-02 MED ORDER — BENZONATATE 100 MG PO CAPS: 100.0000 mg | ORAL_CAPSULE | Freq: Three times a day (TID) | ORAL | 0 refills | Status: DC

## 2023-11-02 MED ORDER — ONDANSETRON 4 MG PO TBDP: ORAL_TABLET | ORAL | 0 refills | Status: DC

## 2023-11-02 MED ORDER — AMOXICILLIN 500 MG PO CAPS: 1000.0000 mg | ORAL_CAPSULE | Freq: Two times a day (BID) | ORAL | 0 refills | Status: DC

## 2023-11-02 NOTE — ED Notes (Signed)
BLUE TOP SENT TO LAB

## 2023-11-02 NOTE — ED Provider Notes (Signed)
[facility] Provider Note   CSN: [id number] Arrival date & time: 11/02/23  0154     History  Chief Complaint  Patient presents with   Shortness of Breath    Grace Daniels is a 32 y.o. female.  32 yo F with a chief complaint of cough congestion and sore throat.  Going on for about a week.  Sounds like her partner as well as their child is sick.  Denies history of smoking.  Denies history of asthma.   Shortness of Breath      Home Medications Prior to Admission medications   Medication Sig Start Date End Date Taking? Authorizing Provider  amoxicillin (AMOXIL) 500 MG capsule Take 2 capsules (1,000 mg total) by mouth 2 (two) times daily. 11/02/23  Yes Melene Plan, DO  benzonatate (TESSALON) 100 MG capsule Take 1 capsule (100 mg total) by mouth every 8 (eight) hours. 11/02/23  Yes Melene Plan, DO  fluconazole (DIFLUCAN) 150 MG tablet Take 1 tablet (150 mg total) by mouth once as needed for up to 1 dose (yeast infection). You can repeat the dose in a week as needed 11/02/23  Yes Melene Plan, DO  ondansetron (ZOFRAN-ODT) 4 MG disintegrating tablet 4mg  ODT q4 hours prn nausea/vomit 11/02/23  Yes Melene Plan, DO  atorvastatin (LIPITOR) 10 MG tablet Take 1 tablet (10 mg total) by mouth daily. 10/29/22   Ivonne Andrew, NP  blood glucose meter kit and supplies KIT Dispense based on patient and insurance preference. Use up to four times daily as directed. (FOR ICD-9 250.00, 250.01). 06/12/22   Ivonne Andrew, NP  Blood Glucose Monitoring Suppl [facility] BLOOD GLUCOSE METER) w/Device KIT 1 applicator by Does not apply route 4 (four) times daily -  before meals and at bedtime. 05/16/18   Kallie Locks, FNP  Blood Pressure Monitoring (BLOOD PRESSURE MONITOR AUTOMAT) DEVI 1 kit by Does not apply route daily. Patient not taking: Reported on 03/11/2023 04/25/21   Barbette Merino, NP  clotrimazole (GYNE-LOTRIMIN) 1 % vaginal cream Place 1 Applicatorful  vaginally at bedtime. Patient not taking: Reported on 03/11/2023 09/01/22   Guy Sandifer L, PA  Continuous Glucose Sensor (FREESTYLE LIBRE 3 SENSOR) MISC Place 1 sensor on the skin every 14 days. Use to check glucose continuously 04/01/23   Ivonne Andrew, NP  glipiZIDE (GLUCOTROL) 5 MG tablet Take 1 tablet (5 mg total) by mouth daily. 04/01/23   Ivonne Andrew, NP  glucose blood test strip Use as instructed Patient not taking: Reported on 04/01/2023 03/30/22   Ivonne Andrew, NP  metFORMIN (GLUCOPHAGE-XR) 500 MG 24 hr tablet Take 2 tablets (1,000 mg total) by mouth 2 (two) times daily with a meal. 12/31/22   Ivonne Andrew, NP  Semaglutide,0.25 or 0.5MG /DOS, 2 MG/3ML SOPN Inject 0.5 mg into the skin once a week. 06/24/23   Ivonne Andrew, NP  triamterene-hydrochlorothiazide (DYAZIDE) 37.5-25 MG capsule TAKE 1 CAPSULE BY MOUTH EVERY DAY 07/30/23   Ivonne Andrew, NP      Allergies    Patient has no known allergies.    Review of Systems   Review of Systems  Respiratory:  Positive for shortness of breath.     Physical Exam Updated Vital Signs BP (!) 143/94   Pulse 92   Temp (!) 97.1 F (36.2 C)   Resp 18   Ht 5\' 10"  (1.778 m)   Wt (!) 149.7 kg   SpO2 100%  BMI 47.35 kg/m  Physical Exam Vitals and nursing note reviewed.  Constitutional:      General: She is not in acute distress.    Appearance: She is well-developed. She is not diaphoretic.     Comments: Smells of marijuana  HENT:     Head: Normocephalic and atraumatic.     Comments: Swollen turbinates, posterior nasal drip, no noted sinus ttp, tm normal bilaterally.   Eyes:     Pupils: Pupils are equal, round, and reactive to light.  Cardiovascular:     Rate and Rhythm: Normal rate and regular rhythm.     Heart sounds: No murmur heard.    No friction rub. No gallop.  Pulmonary:     Effort: Pulmonary effort is normal.     Breath sounds: No wheezing or rales.  Abdominal:     General: There is no distension.      Palpations: Abdomen is soft.     Tenderness: There is no abdominal tenderness.  Musculoskeletal:        General: No tenderness.     Cervical back: Normal range of motion and neck supple.  Skin:    General: Skin is warm and dry.  Neurological:     Mental Status: She is alert and oriented to person, place, and time.  Psychiatric:        Behavior: Behavior normal.     ED Results / Procedures / Treatments   Labs (all labs ordered are listed, but only abnormal results are displayed) Labs Reviewed  GROUP A STREP BY PCR - Abnormal; Notable for the following components:      Result Value   Group A Strep by PCR DETECTED (*)    All other components within normal limits  COMPREHENSIVE METABOLIC PANEL - Abnormal; Notable for the following components:   Sodium 134 (*)    Glucose, Bld 280 (*)    AST 12 (*)    All other components within normal limits  CBC - Abnormal; Notable for the following components:   WBC 13.0 (*)    RBC 5.44 (*)    Hemoglobin 11.4 (*)    HCT 35.9 (*)    MCV 66.0 (*)    MCH 21.0 (*)    RDW 16.3 (*)    Platelets 432 (*)    All other components within normal limits  RESP PANEL BY RT-PCR (RSV, FLU A&B, COVID)  [id number]  PREGNANCY, URINE    EKG EKG Interpretation Date/Time:  Tuesday November 02 2023 02:11:22 EST Ventricular Rate:  101 PR Interval:  154 QRS Duration:  78 QT Interval:  358 QTC Calculation: 464 R Axis:   111  Text Interpretation: Sinus tachycardia with occasional Premature ventricular complexes Right axis deviation Abnormal ECG No significant change since last tracing Confirmed by Melene Plan [phone]) on 11/02/2023 3:49:57 AM  Radiology No results found.  Procedures Procedures    Medications Ordered in ED Medications - No data to display  ED Course/ Medical Decision Making/ A&P                                 Medical Decision Making Amount and/or Complexity of Data Reviewed Labs: ordered. Radiology: ordered.  Risk Prescription  drug management.   32 yo F with a chief complaint of cough congestion and sore throat.  This been going on for a week.  She is well-appearing nontoxic.  Chest x-ray independently interpreted by me without focal  infiltrate or pneumothorax.  Hyperglycemic without acidosis or anion gap.  Mild leukocytosis.  Mild anemia.  Patient's strep test ordered in triage was positive.  I discussion with her about how I felt this was unlikely to be acute strep pharyngitis it was much more likely be colonization.  She did elect for treatment with antibiotics.  3:50 AM:  I have discussed the diagnosis/risks/treatment options with the patient.  Evaluation and diagnostic testing in the emergency department does not suggest an emergent condition requiring admission or immediate intervention beyond what has been performed at this time.  They will follow up with PCP. We also discussed returning to the ED immediately if new or worsening sx occur. We discussed the sx which are most concerning (e.g., sudden worsening pain, fever, inability to tolerate by mouth) that necessitate immediate return. Medications administered to the patient during their visit and any new prescriptions provided to the patient are listed below.  Medications given during this visit Medications - No data to display   The patient appears reasonably screen and/or stabilized for discharge and I doubt any other medical condition or other [facility] requiring further screening, evaluation, or treatment in the ED at this time prior to discharge.          Final Clinical Impression(s) / ED Diagnoses Final diagnoses:  Viral URI with cough  Strep pharyngitis    Rx / DC Orders ED Discharge Orders          Ordered    amoxicillin (AMOXIL) 500 MG capsule  2 times daily        11/02/23 0318    benzonatate (TESSALON) 100 MG capsule  Every 8 hours        11/02/23 0318    ondansetron (ZOFRAN-ODT) 4 MG disintegrating tablet        11/02/23 0318     fluconazole (DIFLUCAN) 150 MG tablet  Once PRN        11/02/23 0326              Melene Plan, DO 11/02/23 0350

## 2023-11-02 NOTE — ED Notes (Signed)
PT CHAIN PLACED IN BAG AND GIVEN TO PT

## 2023-11-02 NOTE — ED Triage Notes (Signed)
Pt here for sore throat and SOB when walking for 1.5 weeks.Pt has productive cough.Reports feeling like she had a fever with chills.Pt taking OTC meds and it helps. Denies CP

## 2023-11-02 NOTE — Discharge Instructions (Signed)
Take tylenol 2 pills 4 times a day and motrin 4 pills 3 times a day.  Drink plenty of fluids.  Return for worsening shortness of breath, headache, confusion. Follow up with your family doctor.   

## 2023-11-25 ENCOUNTER — Ambulatory Visit: Payer: BC Managed Care – PPO | Admitting: Nurse Practitioner

## 2023-11-25 ENCOUNTER — Other Ambulatory Visit: Payer: Self-pay | Admitting: Pharmacist

## 2023-11-25 ENCOUNTER — Encounter: Payer: Self-pay | Admitting: Nurse Practitioner

## 2023-11-25 VITALS — BP 141/96 | HR 106 | Temp 97.1°F | Wt 336.8 lb

## 2023-11-25 DIAGNOSIS — E119 Type 2 diabetes mellitus without complications: Secondary | ICD-10-CM | POA: Diagnosis not present

## 2023-11-25 DIAGNOSIS — I10 Essential (primary) hypertension: Secondary | ICD-10-CM

## 2023-11-25 DIAGNOSIS — R7309 Other abnormal glucose: Secondary | ICD-10-CM

## 2023-11-25 LAB — POCT URINALYSIS DIP (CLINITEK)
Bilirubin, UA: NEGATIVE
Blood, UA: NEGATIVE
Glucose, UA: 500 mg/dL — AB
Leukocytes, UA: NEGATIVE
Nitrite, UA: NEGATIVE
POC PROTEIN,UA: NEGATIVE
Spec Grav, UA: 1.025 (ref 1.010–1.025)
Urobilinogen, UA: 1 U/dL
pH, UA: 7 (ref 5.0–8.0)

## 2023-11-25 LAB — POCT GLYCOSYLATED HEMOGLOBIN (HGB A1C): Hemoglobin A1C: 10.4 % — AB (ref 4.0–5.6)

## 2023-11-25 MED ORDER — AMLODIPINE BESYLATE 5 MG PO TABS: 5.0000 mg | ORAL_TABLET | Freq: Every day | ORAL | 1 refills | Status: DC

## 2023-11-25 MED ORDER — ATORVASTATIN CALCIUM 10 MG PO TABS
10.0000 mg | ORAL_TABLET | Freq: Every day | ORAL | 3 refills | Status: AC
Start: 2023-11-25 — End: ?

## 2023-11-25 MED ORDER — SEMAGLUTIDE(0.25 OR 0.5MG/DOS) 2 MG/3ML ~~LOC~~ SOPN
0.5000 mg | PEN_INJECTOR | SUBCUTANEOUS | 0 refills | Status: DC
Start: 2023-11-25 — End: 2024-05-12

## 2023-11-25 MED ORDER — GLIPIZIDE 5 MG PO TABS
5.0000 mg | ORAL_TABLET | Freq: Every day | ORAL | 1 refills | Status: DC
Start: 2023-11-25 — End: 2024-06-12

## 2023-11-25 MED ORDER — METFORMIN HCL ER 500 MG PO TB24
1000.0000 mg | ORAL_TABLET | Freq: Two times a day (BID) | ORAL | 1 refills | Status: DC
Start: 2023-11-25 — End: 2024-06-23

## 2023-11-25 MED ORDER — TRIAMTERENE-HCTZ 37.5-25 MG PO CAPS: ORAL_CAPSULE | ORAL | 0 refills | Status: DC

## 2023-11-25 NOTE — Progress Notes (Signed)
 Subjective   Patient ID: Grace Daniels, female    DOB: Jan 03, 1991, 33 y.o.   MRN: [id number]  Chief Complaint  Patient presents with   Diabetes    Follow up    Referring provider: Oley Bascom RAMAN, NP  Grace Daniels is a 33 y.o. female with Past Medical History: 11/2020: Coronavirus infection No date: Diabetes (HCC) No date: Hyperlipidemia No date: Hypertension No date: Morbid obesity (HCC) 05/2020: Right knee pain 05/2020: Right thigh pain No date: Sleep apnea 01/2021: Vitamin D  deficiency   HPI  Patient presents today for follow-up visit.  Overall she has been doing well.  A1c today in office was 10.4.  Patient states that she has stopped her Ozempic .  We will reorder this today.  We are also going to add amlodipine  5 mg daily for blood pressure.  Denies f/c/s, n/v/d, hemoptysis, PND, leg swelling Denies chest pain or edema      No Known Allergies  Immunization History  Administered Date(s) Administered   Tdap 03/26/2022    Tobacco History: Social History   Tobacco Use  Smoking Status Never   Passive exposure: Never  Smokeless Tobacco Never   Counseling given: Not Answered   Outpatient Encounter Medications as of 11/25/2023  Medication Sig   amLODipine  (NORVASC ) 5 MG tablet Take 1 tablet (5 mg total) by mouth daily.   blood glucose meter kit and supplies KIT Dispense based on patient and insurance preference. Use up to four times daily as directed. (FOR ICD-9 250.00, 250.01).   Blood Glucose Monitoring Suppl (ACURA BLOOD GLUCOSE METER) w/Device KIT 1 applicator by Does not apply route 4 (four) times daily -  before meals and at bedtime.   Continuous Glucose Sensor (FREESTYLE LIBRE 3 SENSOR) MISC Place 1 sensor on the skin every 14 days. Use to check glucose continuously   [DISCONTINUED] atorvastatin  (LIPITOR) 10 MG tablet Take 1 tablet (10 mg total) by mouth daily.   [DISCONTINUED] glipiZIDE  (GLUCOTROL ) 5 MG tablet Take 1 tablet (5 mg total) by mouth  daily.   [DISCONTINUED] metFORMIN  (GLUCOPHAGE -XR) 500 MG 24 hr tablet Take 2 tablets (1,000 mg total) by mouth 2 (two) times daily with a meal.   amoxicillin  (AMOXIL ) 500 MG capsule Take 2 capsules (1,000 mg total) by mouth 2 (two) times daily. (Patient not taking: Reported on 11/25/2023)   atorvastatin  (LIPITOR) 10 MG tablet Take 1 tablet (10 mg total) by mouth daily.   benzonatate  (TESSALON ) 100 MG capsule Take 1 capsule (100 mg total) by mouth every 8 (eight) hours. (Patient not taking: Reported on 11/25/2023)   Blood Pressure Monitoring (BLOOD PRESSURE MONITOR AUTOMAT) DEVI 1 kit by Does not apply route daily. (Patient not taking: Reported on 11/25/2023)   clotrimazole  (GYNE-LOTRIMIN ) 1 % vaginal cream Place 1 Applicatorful vaginally at bedtime. (Patient not taking: Reported on 11/25/2023)   fluconazole  (DIFLUCAN ) 150 MG tablet Take 1 tablet (150 mg total) by mouth once as needed for up to 1 dose (yeast infection). You can repeat the dose in a week as needed (Patient not taking: Reported on 11/25/2023)   glipiZIDE  (GLUCOTROL ) 5 MG tablet Take 1 tablet (5 mg total) by mouth daily.   glucose blood test strip Use as instructed (Patient not taking: Reported on 11/25/2023)   metFORMIN  (GLUCOPHAGE -XR) 500 MG 24 hr tablet Take 2 tablets (1,000 mg total) by mouth 2 (two) times daily with a meal.   ondansetron  (ZOFRAN -ODT) 4 MG disintegrating tablet 4mg  ODT q4 hours prn nausea/vomit (Patient not taking: Reported  on 11/25/2023)   Semaglutide ,0.25 or 0.5MG /DOS, 2 MG/3ML SOPN Inject 0.5 mg into the skin once a week.   triamterene -hydrochlorothiazide (DYAZIDE) 37.5-25 MG capsule TAKE 1 CAPSULE BY MOUTH EVERY DAY   [DISCONTINUED] Semaglutide ,0.25 or 0.5MG /DOS, 2 MG/3ML SOPN Inject 0.5 mg into the skin once a week. (Patient not taking: Reported on 11/25/2023)   [DISCONTINUED] triamterene -hydrochlorothiazide (DYAZIDE) 37.5-25 MG capsule TAKE 1 CAPSULE BY MOUTH EVERY DAY (Patient not taking: Reported on 11/25/2023)   No  facility-administered encounter medications on file as of 11/25/2023.    Review of Systems  Review of Systems  Constitutional: Negative.   HENT: Negative.    Cardiovascular: Negative.   Gastrointestinal: Negative.   Allergic/Immunologic: Negative.   Neurological: Negative.   Psychiatric/Behavioral: Negative.       Objective:   BP (!) 141/96   Pulse (!) 106   Temp (!) 97.1 F (36.2 C)   Wt (!) 336 lb 12.8 oz (152.8 kg)   SpO2 100%   BMI 48.33 kg/m   Wt Readings from Last 5 Encounters:  11/25/23 (!) 336 lb 12.8 oz (152.8 kg)  11/02/23 (!) 330 lb (149.7 kg)  06/06/23 (!) 330 lb (149.7 kg)  03/11/23 (!) 334 lb 3.2 oz (151.6 kg)  10/29/22 (!) 341 lb (154.7 kg)     Physical Exam Vitals and nursing note reviewed.  Constitutional:      General: She is not in acute distress.    Appearance: She is well-developed.  Cardiovascular:     Rate and Rhythm: Normal rate and regular rhythm.  Pulmonary:     Effort: Pulmonary effort is normal.     Breath sounds: Normal breath sounds.  Neurological:     Mental Status: She is alert and oriented to person, place, and time.       Assessment & Plan:   Hypertension, unspecified type  Type 2 diabetes mellitus without complication, without long-term current use of insulin  (HCC) -     CBC -     Comprehensive metabolic panel -     Lipid panel -     Microalbumin / creatinine urine ratio -     AMB Referral VBCI Care Management -     Atorvastatin  Calcium ; Take 1 tablet (10 mg total) by mouth daily.  Dispense: 90 tablet; Refill: 3 -     glipiZIDE ; Take 1 tablet (5 mg total) by mouth daily.  Dispense: 90 tablet; Refill: 1 -     metFORMIN  HCl ER; Take 2 tablets (1,000 mg total) by mouth 2 (two) times daily with a meal.  Dispense: 360 tablet; Refill: 1 -     POCT glycosylated hemoglobin (Hb A1C) -     POCT URINALYSIS DIP (CLINITEK) -     Triamterene -HCTZ; TAKE 1 CAPSULE BY MOUTH EVERY DAY  Dispense: 90 capsule; Refill: 0  Hemoglobin A1C  greater than 9%, indicating poor diabetic control -     Semaglutide (0.25 or 0.5MG /DOS); Inject 0.5 mg into the skin once a week.  Dispense: 3 mL; Refill: 0  Essential (primary) hypertension -     Triamterene -HCTZ; TAKE 1 CAPSULE BY MOUTH EVERY DAY  Dispense: 90 capsule; Refill: 0  Other orders -     amLODIPine  Besylate; Take 1 tablet (5 mg total) by mouth daily.  Dispense: 90 tablet; Refill: 1     Return in about 3 months (around 02/23/2024).   Bascom GORMAN Borer, NP 12/10/2023

## 2023-11-25 NOTE — Progress Notes (Signed)
 Care Coordination  Met with patient while seeing PCP. She stopped Ozempic  due to constipation. Discussed retrying Ozempic  vs basal insulin , patient elects to restart Ozempic .   Discussed increasing fluid and fibers through whole grains, fruits and vegetables, or OTC metamucil. Start Ozempic  back at 0.25 mg weekly for 2 weeks then increase to 0.5 mg weekly.   Also discussed blood pressure. Patient tells me that she took her triamterene /hydrochlorothiazide about an hour ago. Pressures have been consistently elevated. Start amlodipine  5 mg daily.   Follow up: phone call in ~5-6 weeks  Catie IVAR Centers, PharmD, BCACP, CPP Clinical Pharmacist [facility] [phone]

## 2023-11-25 NOTE — Patient Instructions (Addendum)
 Start Ozempic  back at 0.25 mg weekly for 2 weeks, then increase to 0.5 mg weekly.   Check your blood sugars twice daily:  1) Fasting, first thing in the morning before breakfast and  2) 2 hours after your largest meal.   For a goal A1c of less than 7%, goal fasting readings are less than 130 and goal 2 hour after meal readings are less than 180.    Please add amlodipine  5 mg daily for your blood pressure.   Thanks!

## 2023-11-26 ENCOUNTER — Encounter: Payer: Self-pay | Admitting: Pharmacist

## 2023-11-26 LAB — CBC
Hematocrit: 37.6 % (ref 34.0–46.6)
Hemoglobin: 11.5 g/dL (ref 11.1–15.9)
MCH: 20.9 pg — ABNORMAL LOW (ref 26.6–33.0)
MCHC: 30.6 g/dL — ABNORMAL LOW (ref 31.5–35.7)
MCV: 69 fL — ABNORMAL LOW (ref 79–97)
Platelets: 405 10*3/uL (ref 150–450)
RBC: 5.49 x10E6/uL — ABNORMAL HIGH (ref 3.77–5.28)
RDW: 17.1 % — ABNORMAL HIGH (ref 11.7–15.4)
WBC: 9.4 10*3/uL (ref 3.4–10.8)

## 2023-11-26 LAB — COMPREHENSIVE METABOLIC PANEL
ALT: 8 [IU]/L (ref 0–32)
AST: 11 [IU]/L (ref 0–40)
Albumin: 3.9 g/dL (ref 3.9–4.9)
Alkaline Phosphatase: 76 [IU]/L (ref 44–121)
BUN/Creatinine Ratio: 12 (ref 9–23)
BUN: 8 mg/dL (ref 6–20)
Bilirubin Total: <0.2 mg/dL (ref 0.0–1.2)
CO2: 21 mmol/L (ref 20–29)
Calcium: 9.3 mg/dL (ref 8.7–10.2)
Chloride: 103 mmol/L (ref 96–106)
Creatinine, Ser: 0.66 mg/dL (ref 0.57–1.00)
Globulin, Total: 3.3 g/dL (ref 1.5–4.5)
Glucose: 240 mg/dL — ABNORMAL HIGH (ref 70–99)
Potassium: 4.3 mmol/L (ref 3.5–5.2)
Sodium: 137 mmol/L (ref 134–144)
Total Protein: 7.2 g/dL (ref 6.0–8.5)
eGFR: 119 mL/min/{1.73_m2} (ref >59)

## 2023-11-26 LAB — LIPID PANEL
Chol/HDL Ratio: 4.4 {ratio} (ref 0.0–4.4)
Cholesterol, Total: 161 mg/dL (ref 100–199)
HDL: 37 mg/dL — ABNORMAL LOW (ref >39)
LDL Chol Calc (NIH): 103 mg/dL — ABNORMAL HIGH (ref 0–99)
Triglycerides: 114 mg/dL (ref 0–149)
VLDL Cholesterol Cal: 21 mg/dL (ref 5–40)

## 2023-11-28 LAB — MICROALBUMIN / CREATININE URINE RATIO
Creatinine, Urine: 69.7 mg/dL
Microalb/Creat Ratio: 30 mg/g{creat} — ABNORMAL HIGH (ref 0–29)
Microalbumin, Urine: 21.2 ug/mL

## 2023-12-01 ENCOUNTER — Telehealth: Payer: Self-pay | Admitting: Nurse Practitioner

## 2023-12-01 NOTE — Telephone Encounter (Signed)
 Copied from CRM [id number]. Topic: Clinical - Lab/Test Results >> Dec 01, 2023  2:12 PM Star East wrote: Reason for CRM: Patient has questions about lab results, please call patient [phone]

## 2023-12-10 ENCOUNTER — Encounter: Payer: Self-pay | Admitting: Nurse Practitioner

## 2023-12-16 DIAGNOSIS — G4733 Obstructive sleep apnea (adult) (pediatric): Secondary | ICD-10-CM | POA: Diagnosis not present

## 2024-01-02 ENCOUNTER — Other Ambulatory Visit: Payer: Self-pay

## 2024-01-02 NOTE — Progress Notes (Unsigned)
01/02/2024 Name: Grace Daniels MRN: [id number] DOB: 12-Jan-1991  No chief complaint on file.   Grace Daniels is a 33 y.o. year old female who presented for a telephone visit.   They were referred to the pharmacist by their PCP for assistance in managing diabetes. PMH includes HTN, T2DM, OSA, obesity.  Subjective:  At PCP appt on 11/25/23 - pt reported that she discontinued Ozempic back in Sept/Oct 2024 due to constipation. She had continued metformin. Her A1c remained elevated at 10.4%. She was seen in person by PharmD during PCP visit and elected to restart Ozempic. She was counseled on increasing fluids, fiber, and taking metamucil if constipation returns. She was also restarted on glipizide. Her BP was also elevated at this visit and she was started on amlodipine 5 mg.   Care Team: Primary Care Provider: Ivonne Andrew, NP ; 02/24/24  Medication Access/Adherence  Current Pharmacy:  [id number] [place], [place] - [address] AT [facility] [address] [name] [address] Phone: [phone] Fax: [phone]  [facility] - [facility] [address] [place] [address] Phone: [phone] Fax: [phone]   Patient reports affordability concerns with their medications: No . Initially Ozempic was $25, then was $60. Educated pt on Ozempic coupon card.  Patient reports access/transportation concerns to their pharmacy: No  Patient reports adherence concerns with their medications:  Yes  Out of Ozempic for past 2 months.    Diabetes:  Current medications: metformin ER 1000 mg BID, glipizide 5 mg daily, semaglutide (Ozempic) 0.25 mg subcutaneous weekly   Medications tried in the past: dapagliflozin, dulaglutide, semaglutide (Ozempic) - has dc'd in the past due to constipation.  Current glucose readings: not checking BG at home. Does have BG testing supplies. Still expresses interest in trying  Freestyle Libre 3 for ~1 mo. Resend instructions for [employer]    Patient denies hypoglycemic s/sx including dizziness, shakiness, sweating. Patient denies hyperglycemic symptoms including polyuria, polydipsia, polyphagia, nocturia, neuropathy, blurred vision.  Current meal patterns: Eats 2 meals a day. Usually has breakfast and dinner.  - Breakfast: varies - Supper: Mix of fast food and cooked meals.   Current physical activity: Walks at work. Has gym at new apartment - has not been yet but discussed setting goal of starting to go once a week or once every other week.   Current medication access support: none  Objective:  Lab Results  Component Value Date   HGBA1C 10.4 (A) 11/25/2023   UACR elevated at 30 mg/g (11/25/23)  Lab Results  Component Value Date   CREATININE 0.66 11/25/2023   BUN 8 11/25/2023   NA 137 11/25/2023   K 4.3 11/25/2023   CL 103 11/25/2023   CO2 21 11/25/2023    Lab Results  Component Value Date   CHOL 161 11/25/2023   HDL 37 (L) 11/25/2023   LDLCALC 103 (H) 11/25/2023   TRIG 114 11/25/2023   CHOLHDL 4.4 11/25/2023    Medications Reviewed Today   Medications were not reviewed in this encounter     Assessment/Plan:   Diabetes: - Currently uncontrolled per most recent A1c in Apr 2024 of 10.5% above goal <7%. Pt has not been testing blood sugars, so no additional BG data available. Because pt has been off of semaglutide (Ozempic) 1 mg for >1 month, recommend restarting semaglutide titration at 0.25 mg weekly x 2 weeks followed by 0.5 mg weekly for 4 weeks, then  resume 1 mg weekly. Great candidate for GLP-1RA given metabolic syndrome and BMI >45. Recommend continuing other agents (metformin, glipizide), although unsure of adherence given discrepancy between dispense report and patient report. Educated pt on benefit of monitoring BG either by finger stick or CGM. Pt is not currently a candidate for SGLT2i therapy given recent ED visit for candidal infection  and A1c >10%.  - Reviewed long term cardiovascular and renal outcomes of uncontrolled blood sugar - Reviewed goal A1c, goal fasting, and goal 2 hour post prandial glucose - Reviewed dietary modifications including increasing water intake, trying to reduce fast food.  - Reviewed lifestyle modifications including: increasing protein intake at each meal. Reduce intake of foods high in fat or carbohydrates to prevent GI AE with GLP-1.  - Recommend to start Ozempic (semaglutide) 0.25 mg weekly x2 weeks then increase to 0.5 mg weekly x4 weeks. May then reevalulate resuming Ozempic 1 mg weekly. Reviewed potential AE of GLP-1 and appropriate management.  - Recommend continuing metformin 1000 mg BID and glipizide 5 mg daily.  - Recommend to check glucose 1-2 times week via fingerstick or continuously for 1 mo with Freestyle Libre CGM.  - Educated pt on how to use [employer] Card to make copay $25 instead of $60  Follow Up Plan: Pharmacist telephone ~4 weeks.  Nils Pyle, PharmD PGY1 Pharmacy Resident

## 2024-01-04 ENCOUNTER — Other Ambulatory Visit: Payer: Self-pay

## 2024-01-04 NOTE — Progress Notes (Signed)
Attempted to contact patient for scheduled appointment for medication management. VM full - unable to leave call back information. Sent patient MyChart message with instructions on how to reschedule.   Nils Pyle, PharmD PGY1 Pharmacy Resident

## 2024-01-14 DIAGNOSIS — G4733 Obstructive sleep apnea (adult) (pediatric): Secondary | ICD-10-CM | POA: Diagnosis not present

## 2024-01-20 ENCOUNTER — Telehealth: Payer: Self-pay

## 2024-01-20 ENCOUNTER — Telehealth: Payer: Self-pay | Admitting: Nurse Practitioner

## 2024-01-20 NOTE — Telephone Encounter (Signed)
 Patient was identified as falling into the [place] Measure - Diabetes.   Patient was: Appointment scheduled with primary care provider in the next 30 days.

## 2024-01-20 NOTE — Progress Notes (Signed)
 Complex Care Management Care Guide Note  01/20/2024 Name: Grace Daniels MRN: [id number] DOB: 1991-10-31  Grace Daniels is a 33 y.o. year old female who is a primary care patient of Ivonne Andrew, NP and is actively engaged with the care management team. I reached out to [name] by phone today to assist with re-scheduling  with the Pharmacist.  Follow up plan: Unsuccessful telephone outreach attempt made. A HIPAA compliant phone message was left for the patient providing contact information and requesting a return call.  Penne Lash , RMA     [facility] Guide  Direct Dial: [phone]  Website: [email or web address]

## 2024-02-08 NOTE — Progress Notes (Signed)
 Complex Care Management Care Guide Note  02/08/2024 Name: Grace Daniels MRN: [id number] DOB: 10/19/1991  Grace Daniels is a 33 y.o. year old female who is a primary care patient of Ivonne Andrew, NP and is actively engaged with the care management team. I reached out to [name] by phone today to assist with re-scheduling  with the Pharmacist.  Follow up plan: Unsuccessful telephone outreach attempt made. A HIPAA compliant phone message was left for the patient providing contact information and requesting a return call. Penne Lash , RMA     [facility]  [facility], [facility] Guide  Direct Dial: [phone]  Website: [email or web address]

## 2024-02-13 DIAGNOSIS — G4733 Obstructive sleep apnea (adult) (pediatric): Secondary | ICD-10-CM | POA: Diagnosis not present

## 2024-02-24 ENCOUNTER — Ambulatory Visit: Payer: Self-pay | Admitting: Nurse Practitioner

## 2024-02-25 IMAGING — CR DG KNEE COMPLETE 4+V*L*
4 series · 4 of 4 positions shown · non-contrast
Comparison: None Available.

CLINICAL DATA: Left knee injury

EXAM:
LEFT KNEE - COMPLETE 4+ VIEW

[t knee obl left (1 of 2)]
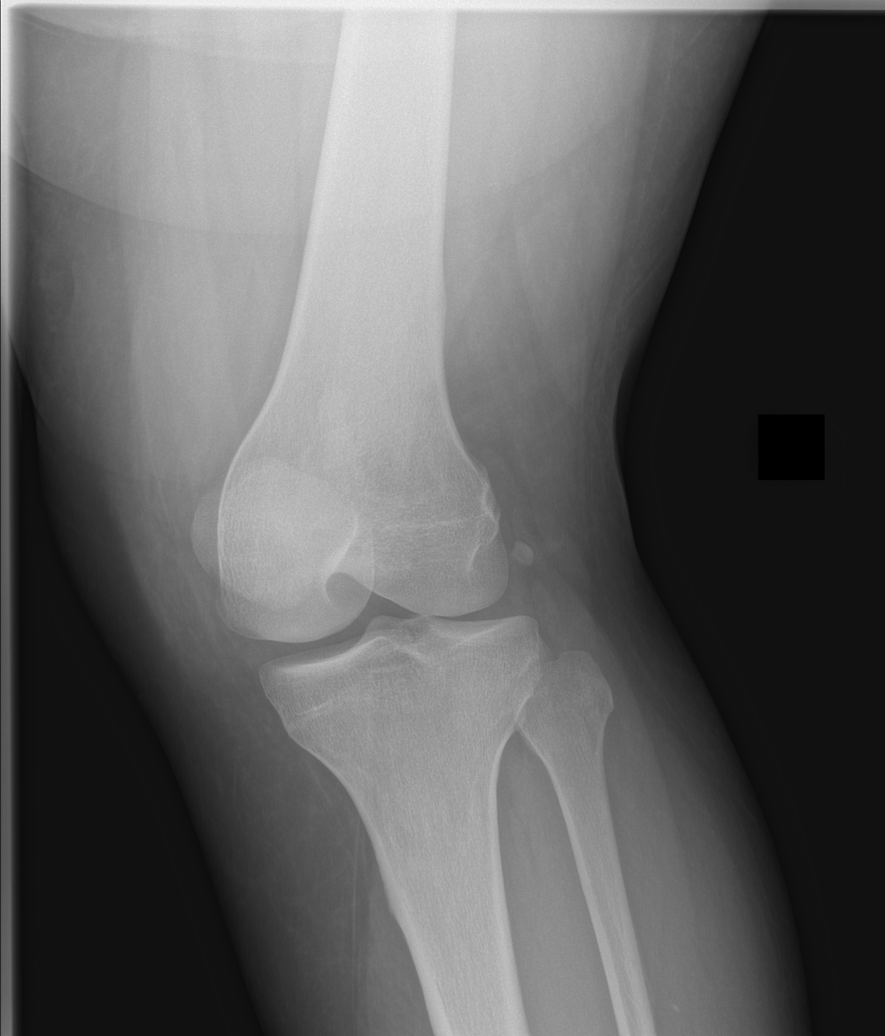

[t knee obl left (2 of 2)]
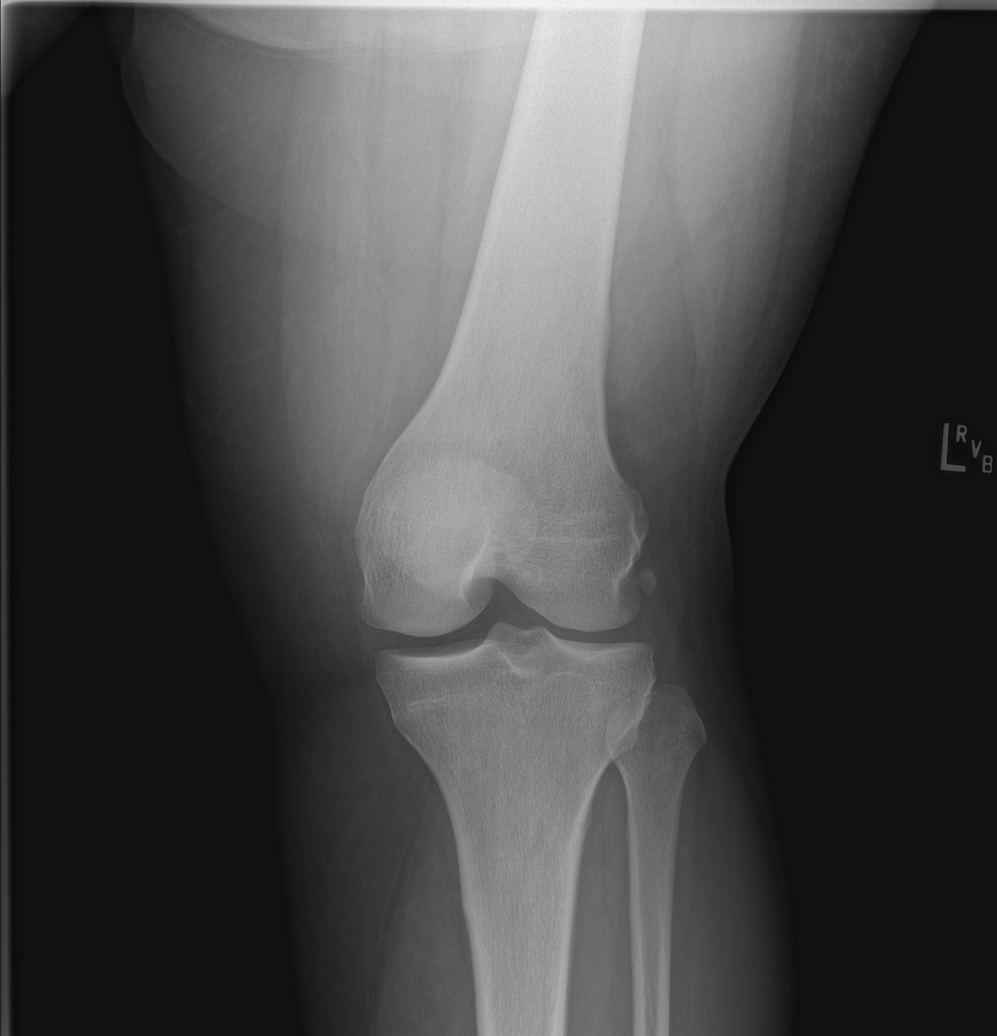

[t knee lat left]
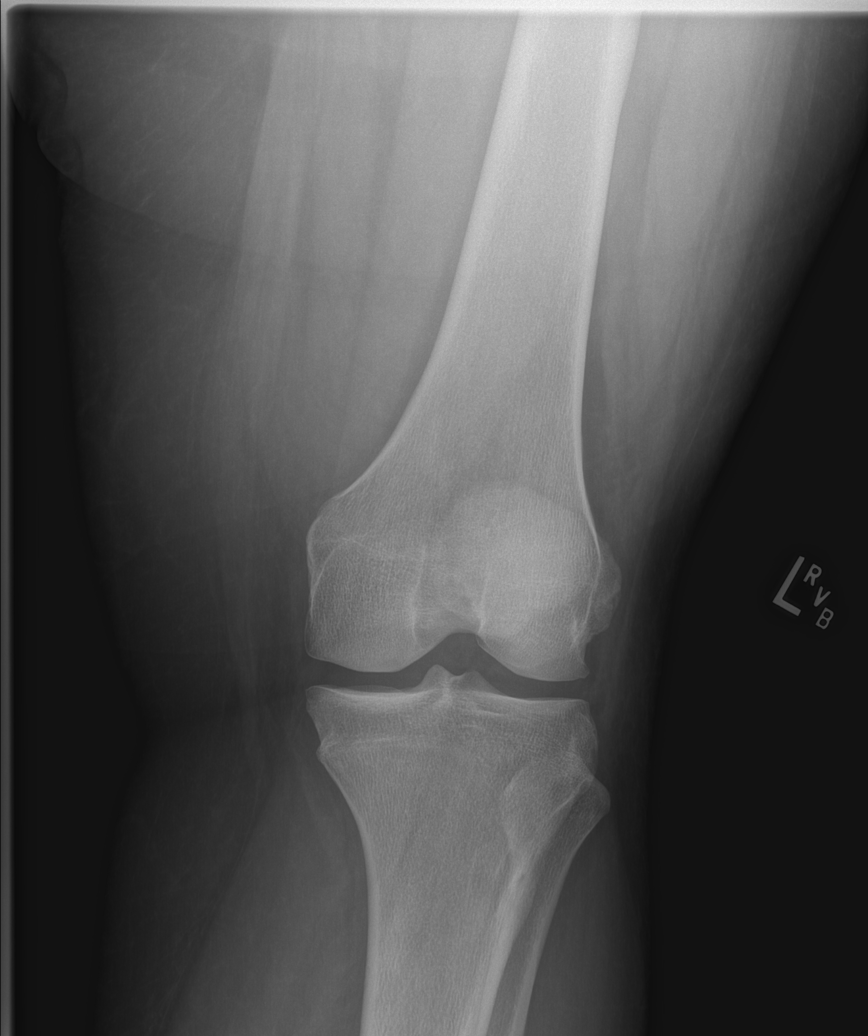

[x knee lat left]
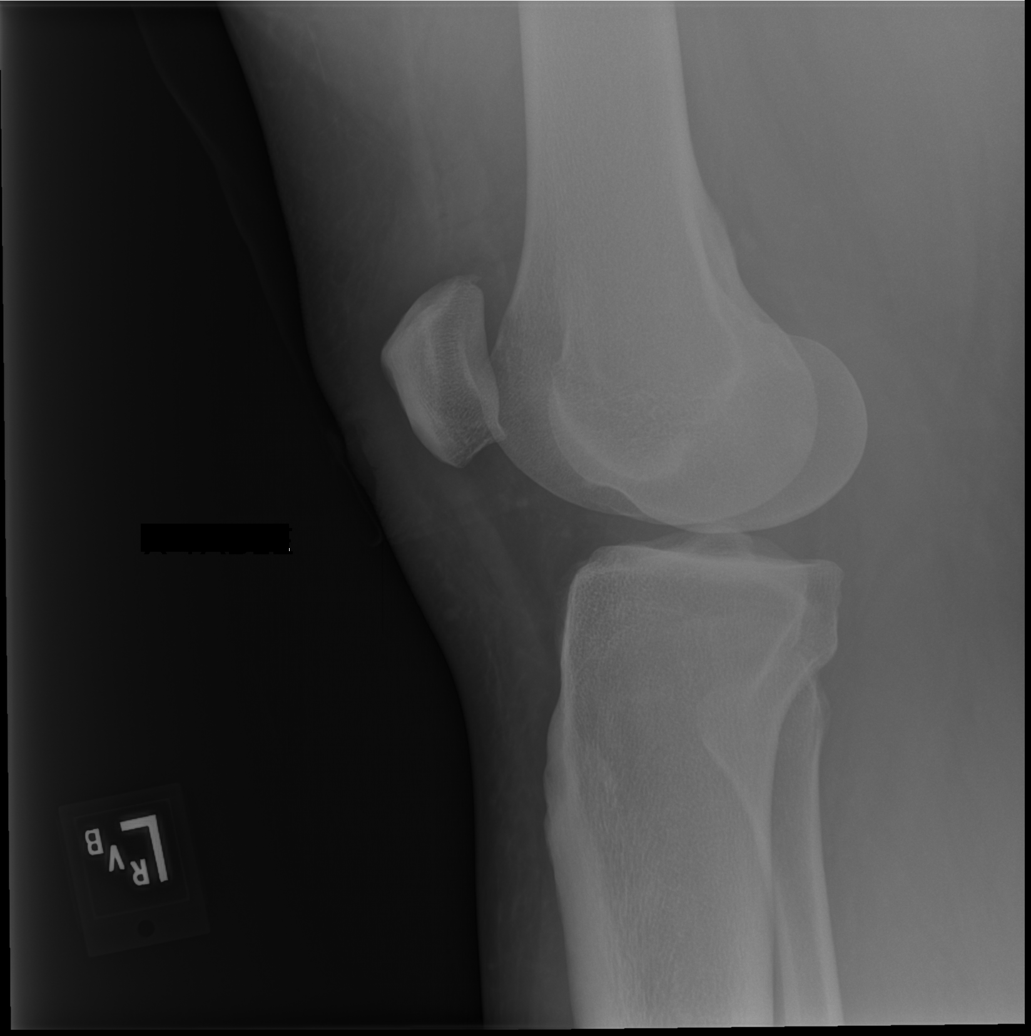

[4 of 4 positions shown; findings below may reference images not displayed]

FINDINGS: No evidence of fracture, dislocation, or joint effusion. No evidence
of significant arthropathy or other focal bone abnormality. Appears
to be prepatellar soft tissue wound injury.
IMPRESSION: No acute osseous abnormality identified. Prepatellar soft tissue
injury.

## 2024-04-18 ENCOUNTER — Ambulatory Visit: Payer: Self-pay | Admitting: *Deleted

## 2024-04-18 NOTE — Telephone Encounter (Signed)
 Chief Complaint: left knee pain swelling  Symptoms: see above  Frequency: on going  Pertinent Negatives: Patient denies inability to walk. No fever  Disposition: [] ED /[] Urgent Care (no appt availability in office) / [] Appointment(In office/virtual)/ []  [facility]/ [] Home Care/ [] Refused Recommended Disposition /[x] [facility]/ []  Follow-up with PCP Additional Notes:   No available OV until June 27. Recommended mobile bus tomorrow and patient requesting location for June 5. Gave locations for mobile unit for 2 days . Recommended if worsening sx go to UC /ED.     Copied from CRM [id number]. Topic: Clinical - Red Word Triage >> Apr 18, 2024  3:37 PM Fonda T wrote: Red Word that prompted transfer to Nurse Triage: Left knee pain, and swelling Reason for Disposition  [1] Very swollen joint AND [2] no fever  Answer Assessment - Initial Assessment Questions 1. LOCATION and RADIATION: "Where is the pain located?"      Left knee 2. QUALITY: "What does the pain feel like?"  (e.g., sharp, dull, aching, burning)     Dull ache can bend  3. SEVERITY: "How bad is the pain?" "What does it keep you from doing?"   (Scale 1-10; or mild, moderate, severe)   -  MILD (1-3): doesn't interfere with normal activities    -  MODERATE (4-7): interferes with normal activities (e.g., work or school) or awakens from sleep, limping    -  SEVERE (8-10): excruciating pain, unable to do any normal activities, unable to walk     Pain with climbing stairs pain level 8/10 4. ONSET: "When did the pain start?" "Does it come and go, or is it there all the time?"     *No Answer* 5. RECURRENT: "Have you had this pain before?" If Yes, ask: "When, and what happened then?"     *No Answer* 6. SETTING: "Has there been any recent work, exercise or other activity that involved that part of the body?"      *No Answer* 7. AGGRAVATING FACTORS: "What makes the knee pain worse?" (e.g., walking, climbing stairs,  running)     *No Answer* 8. ASSOCIATED SYMPTOMS: "Is there any swelling or redness of the knee?"     *No Answer* 9. OTHER SYMPTOMS: "Do you have any other symptoms?" (e.g., chest pain, difficulty breathing, fever, calf pain)     Left knee swelling  10. PREGNANCY: "Is there any chance you are pregnant?" "When was your last menstrual period?"       na  Protocols used: Knee Pain-A-AH  Reason for Disposition  [1] Very swollen joint AND [2] no fever  Answer Assessment - Initial Assessment Questions 1. LOCATION and RADIATION: "Where is the pain located?"      Left knee 2. QUALITY: "What does the pain feel like?"  (e.g., sharp, dull, aching, burning)     Dull ache can bend  3. SEVERITY: "How bad is the pain?" "What does it keep you from doing?"   (Scale 1-10; or mild, moderate, severe)   -  MILD (1-3): doesn't interfere with normal activities    -  MODERATE (4-7): interferes with normal activities (e.g., work or school) or awakens from sleep, limping    -  SEVERE (8-10): excruciating pain, unable to do any normal activities, unable to walk     Pain with climbing stairs pain level 8/10 4. ONSET: "When did the pain start?" "Does it come and go, or is it there all the time?"     Comes and  goes worse in am getting out of bed 5. RECURRENT: "Have you had this pain before?" If Yes, ask: "When, and what happened then?"     Yes  6. SETTING: "Has there been any recent work, exercise or other activity that involved that part of the body?"      Na  7. AGGRAVATING FACTORS: "What makes the knee pain worse?" (e.g., walking, climbing stairs, running)     Climbing stairs  8. ASSOCIATED SYMPTOMS: "Is there any swelling or redness of the knee?"     Yes  9. OTHER SYMPTOMS: "Do you have any other symptoms?" (e.g., chest pain, difficulty breathing, fever, calf pain)     Left knee swelling pain  10. PREGNANCY: "Is there any chance you are pregnant?" "When was your last menstrual period?"       na  Protocols  used: Knee Pain-A-AH

## 2024-04-19 NOTE — Telephone Encounter (Signed)
 Patient have an appointment for 05/12/24

## 2024-04-21 ENCOUNTER — Emergency Department (HOSPITAL_BASED_OUTPATIENT_CLINIC_OR_DEPARTMENT_OTHER)
Admission: EM | Admit: 2024-04-21 | Discharge: 2024-04-21 | Disposition: A | Discharge status: Home / Self Care | Attending: Emergency Medicine | Admitting: Emergency Medicine

## 2024-04-21 ENCOUNTER — Other Ambulatory Visit: Payer: Self-pay

## 2024-04-21 ENCOUNTER — Emergency Department (HOSPITAL_BASED_OUTPATIENT_CLINIC_OR_DEPARTMENT_OTHER)

## 2024-04-21 ENCOUNTER — Encounter (HOSPITAL_BASED_OUTPATIENT_CLINIC_OR_DEPARTMENT_OTHER): Payer: Self-pay | Admitting: Emergency Medicine

## 2024-04-21 DIAGNOSIS — I10 Essential (primary) hypertension: Secondary | ICD-10-CM | POA: Insufficient documentation

## 2024-04-21 DIAGNOSIS — Z79899 Other long term (current) drug therapy: Secondary | ICD-10-CM | POA: Insufficient documentation

## 2024-04-21 DIAGNOSIS — M25562 Pain in left knee: Secondary | ICD-10-CM | POA: Diagnosis not present

## 2024-04-21 MED ORDER — KETOROLAC TROMETHAMINE 30 MG/ML IJ SOLN: 30.0000 mg | Freq: Once | INTRAMUSCULAR | Status: DC

## 2024-04-21 MED ORDER — ONDANSETRON HCL 4 MG/2ML IJ SOLN: 4.0000 mg | Freq: Once | INTRAMUSCULAR | Status: DC

## 2024-04-21 MED ORDER — IBUPROFEN 800 MG PO TABS: 800.0000 mg | ORAL_TABLET | Freq: Three times a day (TID) | ORAL | 0 refills | Status: AC | PRN

## 2024-04-21 MED ORDER — SODIUM CHLORIDE 0.9 % IV BOLUS: 1000.0000 mL | Freq: Once | INTRAVENOUS | Status: DC

## 2024-04-21 NOTE — ED Triage Notes (Signed)
 Left knee pain x 2-3 weeks denies any injury or fall  no swelling sharp pain hurts to bend

## 2024-04-21 NOTE — ED Notes (Signed)
 ED Provider at bedside.

## 2024-04-21 NOTE — ED Provider Notes (Signed)
 [place] [facility]GENCY DEPARTMENT AT [facility] Provider Note   CSN: [id number] Arrival date & time: 04/21/24  1146     History  Chief Complaint  Patient presents with   Knee Pain    Grace Daniels is a 33 y.o. female with history of obesity, hypertension, presents ED with left knee pain.  Patient reports that there was no preceding trauma but she woke up about 2 or 3 weeks ago with aching pain in her left knee.  It has been persistent since then.  It hurts to bend her knee and bear weight.  She reports that she had left knee pain issues about a year ago but that was after a fall and scraping her leg on the ground.  She denies fevers or chills or history of gout  HPI     Home Medications Prior to Admission medications   Medication Sig Start Date End Date Taking? Authorizing Provider  ibuprofen  (ADVIL ) 800 MG tablet Take 1 tablet (800 mg total) by mouth every 8 (eight) hours as needed for up to 30 doses. 04/21/24  Yes Arvilla Birmingham, MD  amLODipine  (NORVASC ) 5 MG tablet Take 1 tablet (5 mg total) by mouth daily. 11/25/23   Jerrlyn Morel, NP  amoxicillin  (AMOXIL ) 500 MG capsule Take 2 capsules (1,000 mg total) by mouth 2 (two) times daily. Patient not taking: Reported on 11/25/2023 11/02/23   Albertus Hughs, DO  atorvastatin  (LIPITOR) 10 MG tablet Take 1 tablet (10 mg total) by mouth daily. 11/25/23   Jerrlyn Morel, NP  benzonatate  (TESSALON ) 100 MG capsule Take 1 capsule (100 mg total) by mouth every 8 (eight) hours. Patient not taking: Reported on 11/25/2023 11/02/23   Albertus Hughs, DO  blood glucose meter kit and supplies KIT Dispense based on patient and insurance preference. Use up to four times daily as directed. (FOR ICD-9 250.00, 250.01). 06/12/22   Jerrlyn Morel, NP  Blood Glucose Monitoring Suppl [facility] BLOOD GLUCOSE METER) w/Device KIT 1 applicator by Does not apply route 4 (four) times daily -  before meals and at bedtime. 05/16/18   Stroud, Natalie M, FNP  Blood  Pressure Monitoring (BLOOD PRESSURE MONITOR AUTOMAT) DEVI 1 kit by Does not apply route daily. Patient not taking: Reported on 11/25/2023 04/25/21   Gregoria Leas, NP  clotrimazole  (GYNE-LOTRIMIN ) 1 % vaginal cream Place 1 Applicatorful vaginally at bedtime. Patient not taking: Reported on 11/25/2023 09/01/22   Crain, Laurina Popper L, PA  Continuous Glucose Sensor (FREESTYLE LIBRE 3 SENSOR) MISC Place 1 sensor on the skin every 14 days. Use to check glucose continuously 04/01/23   Nichols, Tonya S, NP  fluconazole  (DIFLUCAN ) 150 MG tablet Take 1 tablet (150 mg total) by mouth once as needed for up to 1 dose (yeast infection). You can repeat the dose in a week as needed Patient not taking: Reported on 11/25/2023 11/02/23   Albertus Hughs, DO  glipiZIDE  (GLUCOTROL ) 5 MG tablet Take 1 tablet (5 mg total) by mouth daily. 11/25/23   Jerrlyn Morel, NP  glucose blood test strip Use as instructed Patient not taking: Reported on 11/25/2023 03/30/22   Jerrlyn Morel, NP  metFORMIN  (GLUCOPHAGE -XR) 500 MG 24 hr tablet Take 2 tablets (1,000 mg total) by mouth 2 (two) times daily with a meal. 11/25/23   Jerrlyn Morel, NP  ondansetron  (ZOFRAN -ODT) 4 MG disintegrating tablet 4mg  ODT q4 hours prn nausea/vomit Patient not taking: Reported on 11/25/2023 11/02/23   Albertus Hughs, DO  Semaglutide ,0.25 or 0.5MG /DOS, 2 MG/3ML SOPN Inject 0.5 mg into the skin once a week. 11/25/23   Jerrlyn Morel, NP  triamterene -hydrochlorothiazide (DYAZIDE) 37.5-25 MG capsule TAKE 1 CAPSULE BY MOUTH EVERY DAY 11/25/23   Nichols, Tonya S, NP      Allergies    Patient has no known allergies.    Review of Systems   Review of Systems  Physical Exam Updated Vital Signs BP (!) 167/111   Pulse 86   Temp 98.2 F (36.8 C) (Oral)   Resp 16   Ht 5\' 10"  (1.778 m)   Wt (!) 152.8 kg   SpO2 97%   BMI 48.33 kg/m  Physical Exam Constitutional:      General: She is not in acute distress.    Appearance: She is obese.  HENT:     Head: Normocephalic and  atraumatic.  Eyes:     Conjunctiva/sclera: Conjunctivae normal.     Pupils: Pupils are equal, round, and reactive to light.  Cardiovascular:     Rate and Rhythm: Normal rate and regular rhythm.  Pulmonary:     Effort: Pulmonary effort is normal. No respiratory distress.  Abdominal:     General: There is no distension.     Tenderness: There is no abdominal tenderness.  Musculoskeletal:     Comments: Patient is able to ambulate, bear weight, fully flex and extend left knee although there is some pain with flexion and extension and movement.  She has a small left peripatellar effusion without overlying redness.  Skin:    General: Skin is warm and dry.  Neurological:     General: No focal deficit present.     Mental Status: She is alert. Mental status is at baseline.  Psychiatric:        Mood and Affect: Mood normal.        Behavior: Behavior normal.     ED Results / Procedures / Treatments   Labs (all labs ordered are listed, but only abnormal results are displayed) Labs Reviewed - No data to display  EKG None  Radiology DG Knee Complete 4 Views Left Result Date: 04/21/2024 CLINICAL DATA:  Atraumatic left knee pain x3 weeks. EXAM: LEFT KNEE - COMPLETE 4+ VIEW COMPARISON:  Mar 26, 2022 FINDINGS: No evidence of fracture, dislocation, or joint effusion. No evidence of arthropathy or other focal bone abnormality. Soft tissues are unremarkable. IMPRESSION: Negative. Electronically Signed   By: Virgle Grime M.D.   On: 04/21/2024 12:21    Procedures Procedures    Medications Ordered in ED Medications - No data to display  ED Course/ Medical Decision Making/ A&P                                 Medical Decision Making Amount and/or Complexity of Data Reviewed Radiology: ordered.  Risk Prescription drug management.   Patient is here with atraumatic left knee pain.  Differential would include arthritis versus gout or pseudogout versus tendinitis versus other  I have a  very low concern for septic joint.  Doubt DVT.  This is mechanical and directly reproducible with anterior palpation of the knee and joint space.  Do not believe there is an emergent indication for blood testing or arthrocentesis.  The patient had x-rays ordered from triage reviewed myself no emergent findings.  Given is been 3 weeks now, I did recommend follow-up with an orthopedic clinic.  In the meantime we will put  her on high-dose NSAIDs.  We talked about ice and compression and elevation is much as possible.  She verbalized understanding.  Incidentally noted blood pressure is high today and she can follow-up as an outpatient for this issue.        Final Clinical Impression(s) / ED Diagnoses Final diagnoses:  Acute pain of left knee  Hypertension, unspecified type    Rx / DC Orders ED Discharge Orders          Ordered    ibuprofen  (ADVIL ) 800 MG tablet  Every 8 hours PRN        04/21/24 1232              Arvilla Birmingham, MD 04/21/24 1235

## 2024-04-21 NOTE — Discharge Instructions (Addendum)
 Please be aware that your blood pressure was also high in the ER today.  This could be caused by many things including pain.  However it is important that you follow-up with your primary care clinic and monitor and recheck your blood pressure at home.

## 2024-05-06 ENCOUNTER — Emergency Department (HOSPITAL_BASED_OUTPATIENT_CLINIC_OR_DEPARTMENT_OTHER): Admitting: Radiology

## 2024-05-06 ENCOUNTER — Encounter (HOSPITAL_BASED_OUTPATIENT_CLINIC_OR_DEPARTMENT_OTHER): Payer: Self-pay

## 2024-05-06 ENCOUNTER — Other Ambulatory Visit: Payer: Self-pay

## 2024-05-06 ENCOUNTER — Emergency Department (HOSPITAL_BASED_OUTPATIENT_CLINIC_OR_DEPARTMENT_OTHER): Admission: EM | Admit: 2024-05-06 | Discharge: 2024-05-07 | Disposition: A | Discharge status: Home / Self Care

## 2024-05-06 DIAGNOSIS — Z79899 Other long term (current) drug therapy: Secondary | ICD-10-CM | POA: Insufficient documentation

## 2024-05-06 DIAGNOSIS — Z7984 Long term (current) use of oral hypoglycemic drugs: Secondary | ICD-10-CM | POA: Diagnosis not present

## 2024-05-06 DIAGNOSIS — I10 Essential (primary) hypertension: Secondary | ICD-10-CM | POA: Insufficient documentation

## 2024-05-06 DIAGNOSIS — E119 Type 2 diabetes mellitus without complications: Secondary | ICD-10-CM | POA: Insufficient documentation

## 2024-05-06 DIAGNOSIS — M25512 Pain in left shoulder: Secondary | ICD-10-CM | POA: Diagnosis not present

## 2024-05-06 MED ORDER — LIDOCAINE 5 % EX PTCH
1.0000 | MEDICATED_PATCH | CUTANEOUS | Status: DC
  Administered 2024-05-07: 1 via TRANSDERMAL
  Filled 2024-05-06: qty 1

## 2024-05-06 MED ORDER — LIDOCAINE 5 % EX PTCH: 1.0000 | MEDICATED_PATCH | CUTANEOUS | 0 refills | Status: AC

## 2024-05-06 MED ORDER — METHOCARBAMOL 500 MG PO TABS: 500.0000 mg | ORAL_TABLET | Freq: Two times a day (BID) | ORAL | 0 refills | Status: DC

## 2024-05-06 MED ORDER — NAPROXEN 500 MG PO TABS
500.0000 mg | ORAL_TABLET | Freq: Two times a day (BID) | ORAL | 0 refills | Status: DC
Start: 2024-05-06 — End: 2024-06-27

## 2024-05-06 MED ORDER — KETOROLAC TROMETHAMINE 15 MG/ML IJ SOLN: 15.0000 mg | Freq: Once | INTRAMUSCULAR | Status: DC

## 2024-05-06 MED ORDER — KETOROLAC TROMETHAMINE 15 MG/ML IJ SOLN
15.0000 mg | Freq: Once | INTRAMUSCULAR | Status: AC
Start: 2024-05-06 — End: 2024-05-07
  Administered 2024-05-07: 15 mg via INTRAMUSCULAR
  Filled 2024-05-06: qty 1

## 2024-05-06 NOTE — Discharge Instructions (Addendum)
 Recommend taking naproxen  once a morning once in the evening.  You can also alternate Tylenol  1000 mg every 8 hours. Do not take Goodie powders with this.  Use lidocaine  patch, ice and heat over area of pain.  Once you start improvement of symptoms you can try gentle stretching as well. Robaxin  as needed for muscle spasm, don't take a work, driving, etc.  Follow up with PCP.

## 2024-05-06 NOTE — ED Provider Notes (Signed)
 [place] EMERGENCY DEPARTMENT AT [facility] Provider Note   CSN: [id number] Arrival date & time: 05/06/24  2233     Patient presents with: Shoulder Pain   Grace Daniels is a 33 y.o. female.  With past medical history of hypertension, hyperlipidemia, obesity, diabetes presents to emergency room with complaint of shoulder pain. Ongoing for 2 weeks. Locates pain to left upper back and shoulder it is worse with movement.  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  She notes her pain is quite aggravated at work.  She reports this feels similar to a sore muscle but it has not improved like she thought it would.  She denies any cellulitis, fever or swelling.     Shoulder Pain      Prior to Admission medications   Medication Sig Start Date End Date Taking? Authorizing Provider  amLODipine  (NORVASC ) 5 MG tablet Take 1 tablet (5 mg total) by mouth daily. 11/25/23   Oley Bascom RAMAN, NP  amoxicillin  (AMOXIL ) 500 MG capsule Take 2 capsules (1,000 mg total) by mouth 2 (two) times daily. Patient not taking: Reported on 11/25/2023 11/02/23   Emil Share, DO  atorvastatin  (LIPITOR) 10 MG tablet Take 1 tablet (10 mg total) by mouth daily. 11/25/23   Oley Bascom RAMAN, NP  benzonatate  (TESSALON ) 100 MG capsule Take 1 capsule (100 mg total) by mouth every 8 (eight) hours. Patient not taking: Reported on 11/25/2023 11/02/23   Emil Share, DO  blood glucose meter kit and supplies KIT Dispense based on patient and insurance preference. Use up to four times daily as directed. (FOR ICD-9 250.00, 250.01). 06/12/22   Oley Bascom RAMAN, NP  Blood Glucose Monitoring Suppl [facility] BLOOD GLUCOSE METER) w/Device KIT 1 applicator by Does not apply route 4 (four) times daily -  before meals and at bedtime. 05/16/18   Stroud, Natalie M, FNP  Blood Pressure Monitoring (BLOOD PRESSURE MONITOR AUTOMAT) DEVI 1 kit by Does not apply route daily. Patient not taking: Reported on 11/25/2023  04/25/21   Myrna Camelia HERO, NP  clotrimazole  (GYNE-LOTRIMIN ) 1 % vaginal cream Place 1 Applicatorful vaginally at bedtime. Patient not taking: Reported on 11/25/2023 09/01/22   Crain, Benton L, PA  Continuous Glucose Sensor (FREESTYLE LIBRE 3 SENSOR) MISC Place 1 sensor on the skin every 14 days. Use to check glucose continuously 04/01/23   Oley Bascom RAMAN, NP  fluconazole  (DIFLUCAN ) 150 MG tablet Take 1 tablet (150 mg total) by mouth once as needed for up to 1 dose (yeast infection). You can repeat the dose in a week as needed Patient not taking: Reported on 11/25/2023 11/02/23   Emil Share, DO  glipiZIDE  (GLUCOTROL ) 5 MG tablet Take 1 tablet (5 mg total) by mouth daily. 11/25/23   Oley Bascom RAMAN, NP  glucose blood test strip Use as instructed Patient not taking: Reported on 11/25/2023 03/30/22   Oley Bascom RAMAN, NP  ibuprofen  (ADVIL ) 800 MG tablet Take 1 tablet (800 mg total) by mouth every 8 (eight) hours as needed for up to 30 doses. 04/21/24   Cottie Donnice PARAS, MD  metFORMIN  (GLUCOPHAGE -XR) 500 MG 24 hr tablet Take 2 tablets (1,000 mg total) by mouth 2 (two) times daily with a meal. 11/25/23   Oley Bascom RAMAN, NP  ondansetron  (ZOFRAN -ODT) 4 MG disintegrating tablet 4mg  ODT q4 hours prn nausea/vomit Patient not taking: Reported on 11/25/2023 11/02/23   Emil Share, DO  Semaglutide ,0.25 or 0.5MG /DOS, 2 MG/3ML SOPN Inject  0.5 mg into the skin once a week. 11/25/23   Oley Bascom RAMAN, NP  triamterene -hydrochlorothiazide (DYAZIDE) 37.5-25 MG capsule TAKE 1 CAPSULE BY MOUTH EVERY DAY 11/25/23   Nichols, Tonya S, NP    Allergies: Patient has no known allergies.    Review of Systems  Musculoskeletal:  Positive for arthralgias.    Updated Vital Signs BP (!) 141/91   Pulse (!) 110   Temp (!) 97.5 F (36.4 C)   Resp 16   Ht 5' 10 (1.778 m)   Wt (!) 149.7 kg   SpO2 98%   BMI 47.35 kg/m   Physical Exam Vitals and nursing note reviewed.  Constitutional:      General: She is not in acute distress.     Appearance: She is not toxic-appearing.  HENT:     Head: Normocephalic and atraumatic.   Eyes:     General: No scleral icterus.    Conjunctiva/sclera: Conjunctivae normal.    Cardiovascular:     Rate and Rhythm: Normal rate and regular rhythm.     Pulses: Normal pulses.     Heart sounds: Normal heart sounds.  Pulmonary:     Effort: Pulmonary effort is normal. No respiratory distress.     Breath sounds: Normal breath sounds.  Abdominal:     General: Abdomen is flat. Bowel sounds are normal.     Palpations: Abdomen is soft.     Tenderness: There is no abdominal tenderness.   Musculoskeletal:     Right lower leg: No edema.     Left lower leg: No edema.     Comments: Patient has tenderness to palpation over upper traps and into posterior aspect of left shoulder.  She has normal range of motion.  Strength is equal bilaterally.  Sensation equal bilaterally.  Strong radial pulse equal bilaterally   Skin:    General: Skin is warm and dry.     Findings: No lesion.   Neurological:     General: No focal deficit present.     Mental Status: She is alert and oriented to person, place, and time. Mental status is at baseline.     (all labs ordered are listed, but only abnormal results are displayed) Labs Reviewed - No data to display  EKG: None  Radiology: DG Shoulder Left Result Date: 05/06/2024 CLINICAL DATA:  Left shoulder pain EXAM: LEFT SHOULDER - 2+ VIEW COMPARISON:  None Available. FINDINGS: There is no evidence of fracture or dislocation. There is no evidence of arthropathy or other focal bone abnormality. Soft tissues are unremarkable. IMPRESSION: Negative. Electronically Signed   By: Greig Pique M.D.   On: 05/06/2024 23:46     Procedures   Medications Ordered in the ED - No data to display                                  Medical Decision Making Amount and/or Complexity of Data Reviewed Radiology: ordered.  Risk Prescription drug management.   This patient  presents to the ED for concern of left shoulder pain, this involves an extensive number of treatment options, and is a complaint that carries with it a high risk of complications and morbidity.  The differential diagnosis includes septic joint, gout, pseudogout, fracture, strain, sprain   Co morbidities that complicate the patient evaluation  Noncontributory    Imaging Studies ordered:  I ordered imaging studies including left shoulder x-ray I independently visualized and  interpreted imaging which showed no acute findings I agree with the radiologist interpretation   Cardiac Monitoring: / EKG:  The patient was maintained on a cardiac monitor.       Problem List / ED Course / Critical interventions / Medication management  Patient presents with left shoulder pain.  On my exam she has reproducible tenderness primarily to upper trap and outside of shoulder.  She has no significant swelling, erythema.  Symptoms do not seem consistent with septic joint nor gout.  She is able to move extremity with normal active passive range of motion.  Strength and sensation intact.  Strong radial pulse equal bilateral.  X-ray is negative without acute fracture nor dislocation.  Feel exam is most consistent with muscle strain. Feel symptoms management and follow up with PCP is appropriate at this time, stable for discharge.  I ordered medication including Toradol , lidocaine  patch Reevaluation of the patient after these medicines showed that the patient improved I have reviewed the patients home medicines and have made adjustments as needed   Plan F/u w/ PCP in 2-3d to ensure resolution of sx.  Patient was given return precautions. Patient stable for discharge at this time.  Patient educated on sx/dx and verbalized understanding of plan. Return to ER w/ new or worsening sx.       Final diagnoses:  Acute pain of left shoulder    ED Discharge Orders          Ordered    naproxen  (NAPROSYN ) 500  MG tablet  2 times daily        05/06/24 2313    lidocaine  (LIDODERM ) 5 %  Every 24 hours        05/06/24 2313    methocarbamol  (ROBAXIN ) 500 MG tablet  2 times daily        05/06/24 2313               Anaria Kroner, Warren SAILOR, PA-C 05/06/24 2357    Neysa Caron PARAS, DO 05/07/24 0012

## 2024-05-06 NOTE — ED Triage Notes (Signed)
 Nontraumatic left shoulder pain x 2 weeks.   Pain aggravated with movement.

## 2024-05-12 ENCOUNTER — Ambulatory Visit: Payer: Self-pay | Admitting: Nurse Practitioner

## 2024-05-12 ENCOUNTER — Encounter: Payer: Self-pay | Admitting: Nurse Practitioner

## 2024-05-12 VITALS — BP 142/82 | HR 89 | Temp 97.9°F | Wt 324.8 lb

## 2024-05-12 DIAGNOSIS — E119 Type 2 diabetes mellitus without complications: Secondary | ICD-10-CM | POA: Diagnosis not present

## 2024-05-12 DIAGNOSIS — M25512 Pain in left shoulder: Secondary | ICD-10-CM

## 2024-05-12 DIAGNOSIS — Z7985 Long-term (current) use of injectable non-insulin antidiabetic drugs: Secondary | ICD-10-CM

## 2024-05-12 DIAGNOSIS — R7309 Other abnormal glucose: Secondary | ICD-10-CM

## 2024-05-12 LAB — POCT GLYCOSYLATED HEMOGLOBIN (HGB A1C): Hemoglobin A1C: 12.4 % — AB (ref 4.0–5.6)

## 2024-05-12 MED ORDER — KETOROLAC TROMETHAMINE 60 MG/2ML IM SOLN
60.0000 mg | Freq: Once | INTRAMUSCULAR | Status: AC
  Administered 2024-05-12: 60 mg via INTRAMUSCULAR

## 2024-05-12 MED ORDER — BLOOD PRESSURE MONITOR AUTOMAT DEVI: 1.0000 | Freq: Every day | 0 refills | Status: DC

## 2024-05-12 MED ORDER — FIBER ADULT GUMMIES 2 G PO CHEW: 2.0000 g | CHEWABLE_TABLET | Freq: Every day | ORAL | 2 refills | Status: AC

## 2024-05-12 MED ORDER — SEMAGLUTIDE(0.25 OR 0.5MG/DOS) 2 MG/3ML ~~LOC~~ SOPN: 0.5000 mg | PEN_INJECTOR | SUBCUTANEOUS | 0 refills | Status: DC

## 2024-05-12 MED ORDER — MELOXICAM 15 MG PO TABS: 15.0000 mg | ORAL_TABLET | Freq: Every day | ORAL | 0 refills | Status: DC

## 2024-05-12 NOTE — Progress Notes (Signed)
 Subjective   Patient ID: Grace Daniels, female    DOB: August 04, 1991, 33 y.o.   MRN: [id number]  Chief Complaint  Patient presents with   Medical Management of Chronic Issues    Patient stated that her left shoulder has been hurting x 3 weeks    Referring provider: Oley Bascom RAMAN, NP  GABRIAL POPPELL is a 33 y.o. female with Past Medical History: 11/2020: Coronavirus infection No date: Diabetes (HCC) No date: Hyperlipidemia No date: Hypertension No date: Morbid obesity (HCC) 05/2020: Right knee pain 05/2020: Right thigh pain No date: Sleep apnea 01/2021: Vitamin D  deficiency   HPI  Patient presents today for follow-up visit.  Overall she has been doing well.  A1c today in office was 12.4.  Patient states that she has stopped her Ozempic .  We will reorder this today.  Will have pharmacy to follow for medication management and compliance issues.  Denies f/c/s, n/v/d, hemoptysis, PND, leg swelling Denies chest pain or edema  Note: Patient also complains of ongoing left shoulder pain.  She was seen in the ED for this about 2 weeks ago.  X-ray was clear.  We will place urgent referral to orthopedics and give Toradol  injection in office today.   No Known Allergies  Immunization History  Administered Date(s) Administered   Tdap 03/26/2022    Tobacco History: Social History   Tobacco Use  Smoking Status Never   Passive exposure: Never  Smokeless Tobacco Never   Counseling given: Not Answered   Outpatient Encounter Medications as of 05/12/2024  Medication Sig   atorvastatin  (LIPITOR) 10 MG tablet Take 1 tablet (10 mg total) by mouth daily.   Fiber Adult Gummies 2 g CHEW Chew 2 g by mouth daily.   glipiZIDE  (GLUCOTROL ) 5 MG tablet Take 1 tablet (5 mg total) by mouth daily.   ibuprofen  (ADVIL ) 800 MG tablet Take 1 tablet (800 mg total) by mouth every 8 (eight) hours as needed for up to 30 doses.   lidocaine  (LIDODERM ) 5 % Place 1 patch onto the skin daily. Remove &  Discard patch within 12 hours or as directed by MD   meloxicam  (MOBIC ) 15 MG tablet Take 1 tablet (15 mg total) by mouth daily.   metFORMIN  (GLUCOPHAGE -XR) 500 MG 24 hr tablet Take 2 tablets (1,000 mg total) by mouth 2 (two) times daily with a meal.   methocarbamol  (ROBAXIN ) 500 MG tablet Take 1 tablet (500 mg total) by mouth 2 (two) times daily.   naproxen  (NAPROSYN ) 500 MG tablet Take 1 tablet (500 mg total) by mouth 2 (two) times daily.   triamterene -hydrochlorothiazide (DYAZIDE) 37.5-25 MG capsule TAKE 1 CAPSULE BY MOUTH EVERY DAY   [DISCONTINUED] Semaglutide ,0.25 or 0.5MG /DOS, 2 MG/3ML SOPN Inject 0.5 mg into the skin once a week.   amLODipine  (NORVASC ) 5 MG tablet Take 1 tablet (5 mg total) by mouth daily. (Patient not taking: Reported on 05/12/2024)   amoxicillin  (AMOXIL ) 500 MG capsule Take 2 capsules (1,000 mg total) by mouth 2 (two) times daily. (Patient not taking: Reported on 11/25/2023)   benzonatate  (TESSALON ) 100 MG capsule Take 1 capsule (100 mg total) by mouth every 8 (eight) hours. (Patient not taking: Reported on 11/25/2023)   blood glucose meter kit and supplies KIT Dispense based on patient and insurance preference. Use up to four times daily as directed. (FOR ICD-9 250.00, 250.01).   Blood Glucose Monitoring Suppl ([facility] BLOOD GLUCOSE METER) w/Device KIT 1 applicator by Does not apply route 4 (four) times daily -  before meals and at bedtime.   Blood Pressure Monitoring (BLOOD PRESSURE MONITOR AUTOMAT) DEVI 1 kit by Does not apply route daily.   clotrimazole  (GYNE-LOTRIMIN ) 1 % vaginal cream Place 1 Applicatorful vaginally at bedtime. (Patient not taking: Reported on 11/25/2023)   Continuous Glucose Sensor (FREESTYLE LIBRE 3 SENSOR) MISC Place 1 sensor on the skin every 14 days. Use to check glucose continuously   fluconazole  (DIFLUCAN ) 150 MG tablet Take 1 tablet (150 mg total) by mouth once as needed for up to 1 dose (yeast infection). You can repeat the dose in a week as needed  (Patient not taking: Reported on 11/25/2023)   glucose blood test strip Use as instructed (Patient not taking: Reported on 11/25/2023)   ondansetron  (ZOFRAN -ODT) 4 MG disintegrating tablet 4mg  ODT q4 hours prn nausea/vomit (Patient not taking: Reported on 11/25/2023)   Semaglutide ,0.25 or 0.5MG /DOS, 2 MG/3ML SOPN Inject 0.5 mg into the skin once a week.   [DISCONTINUED] Blood Pressure Monitoring (BLOOD PRESSURE MONITOR AUTOMAT) DEVI 1 kit by Does not apply route daily. (Patient not taking: Reported on 11/25/2023)   Facility-Administered Encounter Medications as of 05/12/2024  Medication   ketorolac  (TORADOL ) injection 60 mg    Review of Systems  Review of Systems  Constitutional: Negative.   HENT: Negative.    Cardiovascular: Negative.   Gastrointestinal: Negative.   Allergic/Immunologic: Negative.   Neurological: Negative.   Psychiatric/Behavioral: Negative.       Objective:   BP (!) 142/82   Pulse 89   Temp 97.9 F (36.6 C) (Oral)   Wt (!) 324 lb 12.8 oz (147.3 kg)   SpO2 97%   BMI 46.60 kg/m   Wt Readings from Last 5 Encounters:  05/12/24 (!) 324 lb 12.8 oz (147.3 kg)  05/06/24 (!) 330 lb (149.7 kg)  04/21/24 (!) 336 lb 13.8 oz (152.8 kg)  11/25/23 (!) 336 lb 12.8 oz (152.8 kg)  11/02/23 (!) 330 lb (149.7 kg)     Physical Exam Vitals and nursing note reviewed.  Constitutional:      General: She is not in acute distress.    Appearance: She is well-developed.   Cardiovascular:     Rate and Rhythm: Normal rate and regular rhythm.  Pulmonary:     Effort: Pulmonary effort is normal.     Breath sounds: Normal breath sounds.   Neurological:     Mental Status: She is alert and oriented to person, place, and time.       Assessment & Plan:   Type 2 diabetes mellitus without complication, without long-term current use of insulin  (HCC) -     POCT glycosylated hemoglobin (Hb A1C) -     AMB Referral [facility] Care Management  Hemoglobin A1C greater than 9%, indicating poor  diabetic control -     Semaglutide (0.25 or 0.5MG /DOS); Inject 0.5 mg into the skin once a week.  Dispense: 3 mL; Refill: 0  Acute pain of left shoulder -     Ketorolac  Tromethamine  -     Ambulatory referral to Orthopedic Surgery  Other orders -     Meloxicam ; Take 1 tablet (15 mg total) by mouth daily.  Dispense: 30 tablet; Refill: 0 -     Blood Pressure Monitor Automat; 1 kit by Does not apply route daily.  Dispense: 1 each; Refill: 0 -     Fiber Adult Gummies; Chew 2 g by mouth daily.  Dispense: 30 tablet; Refill: 2     Return in about 3 months (around 08/12/2024).  Bascom GORMAN Borer, NP 05/12/2024

## 2024-05-16 ENCOUNTER — Telehealth: Payer: Self-pay | Admitting: *Deleted

## 2024-05-16 NOTE — Progress Notes (Unsigned)
 Care Guide Pharmacy Note  05/16/2024 Name: LACHANDA BUCZEK MRN: [id number] DOB: 12/21/1990  Referred By: Oley Bascom RAMAN, NP Reason for referral: Complex Care Management (Initial outreach to schedule referral with PharmD )   Dama CINDERELLA Sharps is a 33 y.o. year old female who is a primary care patient of Oley Bascom RAMAN, NP.  Dama CINDERELLA Sharps was referred to the pharmacist for assistance related to: DMII  An unsuccessful telephone outreach was attempted today to contact the patient who was referred to the pharmacy team for assistance with medication management. Additional attempts will be made to contact the patient.  Harlene Satterfield  [facility]  [facility], [facility] Guide  Direct Dial: [phone]  Fax [phone]

## 2024-05-17 NOTE — Progress Notes (Unsigned)
 Complex Care Management Note Care Guide Note  05/17/2024 Name: Grace Daniels MRN: [id number] DOB: 04-08-1991   Complex Care Management Outreach Attempts: A second unsuccessful outreach was attempted today to offer the patient with information about available complex care management services.  Follow Up Plan:  Additional outreach attempts will be made to offer the patient complex care management information and services.   Encounter Outcome:  No Answer  Harlene Satterfield  [facility]  [facility] Guide  Direct Dial: [phone]  Fax [phone]

## 2024-05-18 ENCOUNTER — Other Ambulatory Visit: Payer: Self-pay | Admitting: Pharmacist

## 2024-05-18 NOTE — Progress Notes (Signed)
 Care Coordination Call  Patient discussed with scheduler that she picked up Ozempic  on Monday but wasn't sure where to restart dose. Notes she was feeling hot and flushed some this week. She had been out of Ozempic  for ~ 2 months.   Contacted patient. Discussed to start back at 0.25 mg weekly. She noted previously stomach concerns when on Ozempic  0.5 mg previously, which is what led to her discontinuing the medication. I advised her to stick with 0.25 mg weekly until she sees embedded pharmacist in August. If stomach upset recurs with 0.25 mg weekly, I encouraged her to reach out to discuss other options.   Catie IVAR Centers, PharmD, [facility] Clinical Pharmacist [phone]

## 2024-05-18 NOTE — Progress Notes (Signed)
 Care Guide Pharmacy Note  05/18/2024 Name: Grace Daniels MRN: [id number] DOB: 04-05-1991  Referred By: Oley Bascom RAMAN, NP Reason for referral: Complex Care Management (Initial outreach to schedule referral with PharmD )   Grace Daniels is a 33 y.o. year old female who is a primary care patient of Oley Bascom RAMAN, NP.  Grace Daniels was referred to the pharmacist for assistance related to: DMII  Successful contact was made with the patient to discuss pharmacy services including being ready for the pharmacist to call at least 5 minutes before the scheduled appointment time and to have medication bottles and any blood pressure readings ready for review. The patient agreed to meet with the pharmacist via telephone visit on (date/time).8/12 at 1:30 PM  Harlene Satterfield  [facility], [facility] Guide  Direct Dial: [phone]  Fax [phone]

## 2024-05-22 ENCOUNTER — Other Ambulatory Visit: Payer: Self-pay

## 2024-05-23 ENCOUNTER — Other Ambulatory Visit: Payer: Self-pay

## 2024-05-24 ENCOUNTER — Other Ambulatory Visit: Payer: Self-pay

## 2024-05-29 ENCOUNTER — Other Ambulatory Visit: Payer: Self-pay | Admitting: Nurse Practitioner

## 2024-05-29 DIAGNOSIS — R7309 Other abnormal glucose: Secondary | ICD-10-CM

## 2024-05-29 DIAGNOSIS — M5412 Radiculopathy, cervical region: Secondary | ICD-10-CM | POA: Diagnosis not present

## 2024-06-08 ENCOUNTER — Other Ambulatory Visit: Payer: Self-pay

## 2024-06-11 ENCOUNTER — Other Ambulatory Visit: Payer: Self-pay | Admitting: Nurse Practitioner

## 2024-06-12 ENCOUNTER — Other Ambulatory Visit: Payer: Self-pay | Admitting: Nurse Practitioner

## 2024-06-12 DIAGNOSIS — E119 Type 2 diabetes mellitus without complications: Secondary | ICD-10-CM

## 2024-06-12 DIAGNOSIS — R7309 Other abnormal glucose: Secondary | ICD-10-CM

## 2024-06-12 NOTE — Telephone Encounter (Unsigned)
 Copied from CRM #[id number]. Topic: Clinical - Medication Refill >> Jun 12, 2024 12:10 PM Carlatta H wrote: Medication: Semaglutide ,0.25 or 0.5MG /DOS, 2 MG/3ML SOPN glipiZIDE  (GLUCOTROL ) 5 MG tablet  Has the patient contacted their pharmacy? Yes (Agent: If no, request that the patient contact the pharmacy for the refill. If patient does not wish to contact the pharmacy document the reason why and proceed with request.) (Agent: If yes, when and what did the pharmacy advise?)  This is the patient's preferred pharmacy:  [id number] [address], [place] - [address] [facility] [address] [place] [address] Phone: [phone] Fax: [phone]    Is this the correct pharmacy for this prescription? Yes If no, delete pharmacy and type the correct one.   Has the prescription been filled recently? No  Is the patient out of the medication? Yes  Has the patient been seen for an appointment in the last year OR does the patient have an upcoming appointment? Yes  Can we respond through MyChart? Yes  Agent: Please be advised that Rx refills may take up to 3 business days. We ask that you follow-up with your pharmacy.

## 2024-06-12 NOTE — Telephone Encounter (Signed)
 Please advise [facility]

## 2024-06-14 DIAGNOSIS — F411 Generalized anxiety disorder: Secondary | ICD-10-CM | POA: Diagnosis not present

## 2024-06-14 MED ORDER — GLIPIZIDE 5 MG PO TABS: 5.0000 mg | ORAL_TABLET | Freq: Every day | ORAL | 1 refills | Status: AC

## 2024-06-14 MED ORDER — SEMAGLUTIDE(0.25 OR 0.5MG/DOS) 2 MG/3ML ~~LOC~~ SOPN: 0.5000 mg | PEN_INJECTOR | SUBCUTANEOUS | 0 refills | Status: DC

## 2024-06-15 ENCOUNTER — Encounter: Payer: Self-pay | Admitting: Pharmacist

## 2024-06-23 ENCOUNTER — Other Ambulatory Visit: Payer: Self-pay | Admitting: Nurse Practitioner

## 2024-06-23 DIAGNOSIS — E119 Type 2 diabetes mellitus without complications: Secondary | ICD-10-CM

## 2024-06-24 ENCOUNTER — Other Ambulatory Visit: Payer: Self-pay | Admitting: Nurse Practitioner

## 2024-06-24 DIAGNOSIS — I10 Essential (primary) hypertension: Secondary | ICD-10-CM

## 2024-06-24 DIAGNOSIS — E119 Type 2 diabetes mellitus without complications: Secondary | ICD-10-CM

## 2024-06-27 ENCOUNTER — Other Ambulatory Visit (HOSPITAL_COMMUNITY): Payer: Self-pay

## 2024-06-27 ENCOUNTER — Other Ambulatory Visit (INDEPENDENT_AMBULATORY_CARE_PROVIDER_SITE_OTHER): Payer: Self-pay

## 2024-06-27 DIAGNOSIS — E119 Type 2 diabetes mellitus without complications: Secondary | ICD-10-CM

## 2024-06-27 NOTE — Progress Notes (Signed)
 06/27/2024 Name: Grace Daniels MRN: [id number] DOB: 1991-06-01  Chief Complaint  Patient presents with   Diabetes    JADA FASS is a 33 y.o. year old female who presented for a telephone visit.   They were referred to the pharmacist by their PCP for assistance in managing diabetes. PMH includes HTN, T2DM, OSA, obesity.  Subjective: Patient was last seen by PCP, Bascom Borer, NP on 05/12/24. BP was elevated to 142/82 mmHg, HR 89 bpm. A1C had increased from 12.4% to 10.4%. She reported stopping Ozempic  and was instructed to restart at 0.25 mg weekly.  Today, patient reports doing ok. Reports that she does not have time for a full telephone visit today, but reports that she administered her first Ozempic  shot back in June. Since then, she was not able to continue using the pen - thinks that she took it all in one dose. Reports she has not taken any Ozempic  in ~1 mo. She reports she was waiting for refill to be sent to the pharmacy. She was informed that her PCP had sent the refill on 06/14/24. States that one of her primary motivators to restarting her medications is to avoid having to take insulin .   Care Team: Primary Care Provider: Borer Bascom RAMAN, NP ; 08/18/24  Medication Access/Adherence  Current Pharmacy:  [id number] [address] [place], [place] - [address] AT [facility] [address] [place] [address] Phone: [phone] Fax: [phone]  [facility] [address] [place] [address] Phone: [phone] Fax: [phone]   Patient reports affordability concerns with their medications: No . Initially Ozempic  was $25, then was $60. Educated pt on Ozempic  coupon card.  Patient reports access/transportation concerns to their pharmacy: No  Patient reports adherence concerns with their medications:  Yes  Out of Ozempic  for past 2 months.    Diabetes:  Current medications:  metformin  ER 1000 mg BID, glipizide  5 mg daily, semaglutide  (Ozempic ) 0.25 mg subcutaneous weekly (not currently taking, but planning to restart)  Medications tried in the past: dapagliflozin , dulaglutide , semaglutide  (Ozempic ) - has dc'd in the past due to constipation.  Current glucose readings: not checking BG at home. Does have BG testing supplies. S  Patient denies hypoglycemic s/sx including dizziness, shakiness, sweating. Patient denies hyperglycemic symptoms including polyuria, polydipsia, polyphagia, nocturia, neuropathy, blurred vision.  Current meal patterns: did not discuss today  Current physical activity: did not discuss today  Current medication access support: none  Hypertension:  Current medications: amlodipine  5 mg daily, triamterene -hydrochlorothiazide 37.5-25 mg daily   Hyperlipidemia/ASCVD Risk Reduction  Current lipid lowering medications: atorvastatin  10 mg daily   Clinical ASCVD: No  The ASCVD Risk score (Arnett DK, et al., 2019) failed to calculate for the following reasons:   The 2019 ASCVD risk score is only valid for ages 46 to 9     Objective:  BP Readings from Last 3 Encounters:  05/12/24 (!) 142/82  05/07/24 (!) 143/90  04/21/24 (!) 167/111     Lab Results  Component Value Date   HGBA1C 12.4 (A) 05/12/2024   HGBA1C 10.4 (A) 11/25/2023   HGBA1C 10.5 (A) 03/11/2023   UACR elevated at 30 mg/g (11/25/23)  Lab Results  Component Value Date   CREATININE 0.66 11/25/2023   BUN 8 11/25/2023   NA 137 11/25/2023   K 4.3 11/25/2023   CL 103 11/25/2023   CO2 21 11/25/2023    Lab Results  Component Value Date   CHOL 161 11/25/2023   HDL 37 (L) 11/25/2023   LDLCALC 103 (H) 11/25/2023   TRIG 114 11/25/2023   CHOLHDL 4.4 11/25/2023    Medications Reviewed Today     Reviewed by Brinda Lorain SQUIBB, RPH (Pharmacist) on 06/27/24 at 1602  Med List Status: <None>   Medication Order Taking? Sig Documenting Provider Last Dose Status Informant   amLODipine  (NORVASC ) 5 MG tablet [id number] Yes TAKE 1 TABLET (5 MG TOTAL) BY MOUTH DAILY. Oley Bascom RAMAN, NP  Active    Patient not taking:   Discontinued 06/27/24 1559 (Completed Course)   atorvastatin  (LIPITOR) 10 MG tablet [id number] Yes Take 1 tablet (10 mg total) by mouth daily. Oley Bascom RAMAN, NP  Active    Patient not taking:   Discontinued 06/27/24 1559 (Completed Course)     Discontinued 06/27/24 1600 (Expired Prescription)     Discontinued 06/27/24 1600 (Expired Prescription)   Discontinued 06/27/24 1600    Patient not taking:   Discontinued 06/27/24 1559 (Completed Course)     Discontinued 06/27/24 1600 (Expired Prescription)   Fiber Adult Gummies 2 g CHEW [id number]  Chew 2 g by mouth daily. Oley Bascom RAMAN, NP  Active    Patient not taking:   Discontinued 06/27/24 1600 (Completed Course)   glipiZIDE  (GLUCOTROL ) 5 MG tablet [id number] Yes Take 1 tablet (5 mg total) by mouth daily. Oley Bascom RAMAN, NP  Active   glucose blood test strip [id number]  Use as instructed  Patient not taking: Reported on 11/25/2023   Oley Bascom RAMAN, NP  Active   ibuprofen  (ADVIL ) 800 MG tablet [id number]  Take 1 tablet (800 mg total) by mouth every 8 (eight) hours as needed for up to 30 doses. Cottie Donnice PARAS, MD  Active   lidocaine  (LIDODERM ) 5 % [id number]  Place 1 patch onto the skin daily. Remove & Discard patch within 12 hours or as directed by MD Barrett, Jamie N, PA-C  Active   meloxicam  (MOBIC ) 15 MG tablet [id number]  TAKE 1 TABLET (15 MG TOTAL) BY MOUTH DAILY. Nichols, Tonya S, NP  Active   metFORMIN  (GLUCOPHAGE -XR) 500 MG 24 hr tablet [id number] Yes TAKE 2 TABLETS (1,000 MG TOTAL) BY MOUTH 2 (TWO) TIMES DAILY WITH A MEAL. Oley Bascom RAMAN, NP  Active     Discontinued 06/27/24 1601 (Completed Course)     Discontinued 06/27/24 1601 (Completed Course)    Patient not taking:   Discontinued 06/27/24 1602 (Completed Course)   Semaglutide ,0.25 or 0.5MG /DOS, 2 MG/3ML SOPN [id number]  Inject 0.5 mg into  the skin once a week.  Patient not taking: Reported on 06/27/2024   Oley Bascom RAMAN, NP  Active   triamterene -hydrochlorothiazide (DYAZIDE) 37.5-25 MG capsule [id number] Yes TAKE 1 CAPSULE BY MOUTH EVERY DAY Oley Bascom RAMAN, NP  Active             Assessment/Plan:   Diabetes: - Currently uncontrolled per most recent A1Cof 12.4% above goal < 7%. Patient has had difficulties restarting Ozempic  as instructed. She continues to be a good candidate for GLP-1RA given metabolic syndrome and BMI >45. Will collaborate with dispensing pharmacy to facilitate restarting Ozempic . - Patient denies personal or family history of multiple endocrine neoplasia type 2, medullary thyroid  cancer; personal history of pancreatitis or gallbladder disease. - Reviewed long term cardiovascular and renal outcomes of uncontrolled blood sugar - Reviewed goal A1c, goal fasting, and goal 2 hour post prandial glucose - Reviewed dietary modifications including increasing water intake, trying  to reduce fast food.  - Reviewed lifestyle modifications including: increasing protein intake at each meal. Reduce intake of foods high in fat or carbohydrates to prevent GI AE with GLP-1. Discussed eating small portions of meals throughout the day once she restarts Ozempic . - Recommend to restart Ozempic  (semaglutide ) 0.25 mg weekly x2 weeks then increase to 0.5 mg weekly x4 weeks.  - Recommend to continue metformin  XR 1000 mg BID and glipizide  5 mg daily.  - [facility] for Ozempic  for $24.99.  - Next A1C due Sept 2025   Given patient's time constraints, did not have time to discuss HTN or HLD today.    Follow Up Plan:  PharmD telephone 07/13/24 PCP 08/18/24   Lorain Baseman, PharmD [facility] [phone]

## 2024-07-13 ENCOUNTER — Telehealth: Payer: Self-pay

## 2024-07-13 ENCOUNTER — Other Ambulatory Visit: Payer: Self-pay

## 2024-07-13 NOTE — Progress Notes (Signed)
 Attempted to contact patient for scheduled appointment for medication management. Phone was answered by someone else who reported pt was at work and not available. Provided my direct line to reschedule the appointment.  Noted that dispense hx shows patient just picked up Ozempic  on 07/11/24. Will have CPhT reach out to patient next week to confirm she was able to pick up medication and start taking. If patient just started, will reschedule pharmacist telephone follow-up for ~1 mo.   Lorain Baseman, PharmD [facility] [phone]

## 2024-07-13 NOTE — Progress Notes (Unsigned)
 07/13/2024 Name: Grace Daniels MRN: [id number] DOB: 1990/12/06  No chief complaint on file.   Grace Daniels is a 33 y.o. year old female who presented for a telephone visit.   They were referred to the pharmacist by their PCP for assistance in managing diabetes. PMH includes HTN, T2DM, OSA, obesity.  Subjective: Patient was last seen by PCP, Bascom Borer, NP on 05/12/24. BP was elevated to 142/82 mmHg, HR 89 bpm. A1C had increased from 12.4% to 10.4%. She reported stopping Ozempic  and was instructed to restart at 0.25 mg weekly.  Today, patient reports doing ok. Reports that she does not have time for a full telephone visit today, but reports that she administered her first Ozempic  shot back in June. Since then, she was not able to continue using the pen - thinks that she took it all in one dose. Reports she has not taken any Ozempic  in ~1 mo. She reports she was waiting for refill to be sent to the pharmacy. She was informed that her PCP had sent the refill on 06/14/24. States that one of her primary motivators to restarting her medications is to avoid having to take insulin .   Care Team: Primary Care Provider: Borer Bascom RAMAN, NP ; 08/18/24  Medication Access/Adherence  Current Pharmacy:  [id number] [address] [place], [place] - [phone] AT [facility] [address] [place] [address] Phone: [phone] Fax: [phone]  [facility] - [facility] [address] [place] [address] Phone: [phone] Fax: [phone]   Patient reports affordability concerns with their medications: No . Initially Ozempic  was $25, then was $60. Educated pt on Ozempic  coupon card.  Patient reports access/transportation concerns to their pharmacy: No  Patient reports adherence concerns with their medications:  Yes  Out of Ozempic  for past 2 months.    Diabetes:  Current medications: metformin  ER 1000 mg BID,  glipizide  5 mg daily, semaglutide  (Ozempic ) 0.25 mg subcutaneous weekly (not currently taking, but planning to restart)  Medications tried in the past: dapagliflozin , dulaglutide , semaglutide  (Ozempic ) - has dc'd in the past due to constipation.  Current glucose readings: not checking BG at home. Does have BG testing supplies. S  Patient denies hypoglycemic s/sx including dizziness, shakiness, sweating. Patient denies hyperglycemic symptoms including polyuria, polydipsia, polyphagia, nocturia, neuropathy, blurred vision.  Current meal patterns: did not discuss today  Current physical activity: did not discuss today  Current medication access support: none  Hypertension:  Current medications: amlodipine  5 mg daily, triamterene -hydrochlorothiazide 37.5-25 mg daily   Hyperlipidemia/ASCVD Risk Reduction  Current lipid lowering medications: atorvastatin  10 mg daily   Clinical ASCVD: No  The ASCVD Risk score (Arnett DK, et al., 2019) failed to calculate for the following reasons:   The 2019 ASCVD risk score is only valid for ages 32 to 41     Objective:  BP Readings from Last 3 Encounters:  05/12/24 (!) 142/82  05/07/24 (!) 143/90  04/21/24 (!) 167/111     Lab Results  Component Value Date   HGBA1C 12.4 (A) 05/12/2024   HGBA1C 10.4 (A) 11/25/2023   HGBA1C 10.5 (A) 03/11/2023   UACR elevated at 30 mg/g (11/25/23)  Lab Results  Component Value Date   CREATININE 0.66 11/25/2023   BUN 8 11/25/2023   NA 137 11/25/2023   K 4.3 11/25/2023   CL 103 11/25/2023   CO2 21 11/25/2023    Lab Results  Component Value Date  CHOL 161 11/25/2023   HDL 37 (L) 11/25/2023   LDLCALC 103 (H) 11/25/2023   TRIG 114 11/25/2023   CHOLHDL 4.4 11/25/2023    Medications Reviewed Today   Medications were not reviewed in this encounter     Assessment/Plan:   Diabetes: - Currently uncontrolled per most recent A1Cof 12.4% above goal < 7%. Patient has had difficulties restarting  Ozempic  as instructed. She continues to be a good candidate for GLP-1RA given metabolic syndrome and BMI >45. Will collaborate with dispensing pharmacy to facilitate restarting Ozempic . - Patient denies personal or family history of multiple endocrine neoplasia type 2, medullary thyroid  cancer; personal history of pancreatitis or gallbladder disease. - Reviewed long term cardiovascular and renal outcomes of uncontrolled blood sugar - Reviewed goal A1c, goal fasting, and goal 2 hour post prandial glucose - Reviewed dietary modifications including increasing water intake, trying to reduce fast food.  - Reviewed lifestyle modifications including: increasing protein intake at each meal. Reduce intake of foods high in fat or carbohydrates to prevent GI AE with GLP-1. Discussed eating small portions of meals throughout the day once she restarts Ozempic . - Recommend to restart Ozempic  (semaglutide ) 0.25 mg weekly x2 weeks then increase to 0.5 mg weekly x4 weeks.  - Recommend to continue metformin  XR 1000 mg BID and glipizide  5 mg daily.  - [facility] patient's Rx for Ozempic  for $24.99.  - Next A1C due Sept 2025   Given patient's time constraints, did not have time to discuss HTN or HLD today.    Follow Up Plan:  PharmD telephone 07/13/24 PCP 08/18/24   Lorain Baseman, PharmD [facility] [phone]

## 2024-07-18 ENCOUNTER — Other Ambulatory Visit: Payer: Self-pay

## 2024-07-18 ENCOUNTER — Emergency Department (HOSPITAL_BASED_OUTPATIENT_CLINIC_OR_DEPARTMENT_OTHER)
Admission: EM | Admit: 2024-07-18 | Discharge: 2024-07-18 | Disposition: A | Discharge status: Home / Self Care | Attending: Emergency Medicine | Admitting: Emergency Medicine

## 2024-07-18 DIAGNOSIS — R059 Cough, unspecified: Secondary | ICD-10-CM | POA: Diagnosis not present

## 2024-07-18 DIAGNOSIS — Z7984 Long term (current) use of oral hypoglycemic drugs: Secondary | ICD-10-CM | POA: Diagnosis not present

## 2024-07-18 DIAGNOSIS — E119 Type 2 diabetes mellitus without complications: Secondary | ICD-10-CM | POA: Insufficient documentation

## 2024-07-18 DIAGNOSIS — Z79899 Other long term (current) drug therapy: Secondary | ICD-10-CM | POA: Diagnosis not present

## 2024-07-18 DIAGNOSIS — I10 Essential (primary) hypertension: Secondary | ICD-10-CM | POA: Insufficient documentation

## 2024-07-18 DIAGNOSIS — U071 COVID-19: Secondary | ICD-10-CM | POA: Insufficient documentation

## 2024-07-18 LAB — RESP PANEL BY RT-PCR (RSV, FLU A&B, COVID)  RVPGX2
Influenza A by PCR: NEGATIVE
Influenza B by PCR: NEGATIVE
Resp Syncytial Virus by PCR: NEGATIVE
SARS Coronavirus 2 by RT PCR: POSITIVE — AB

## 2024-07-18 MED ORDER — BENZONATATE 100 MG PO CAPS: 100.0000 mg | ORAL_CAPSULE | Freq: Three times a day (TID) | ORAL | 0 refills | Status: AC | PRN

## 2024-07-18 MED ORDER — BENZONATATE 100 MG PO CAPS
200.0000 mg | ORAL_CAPSULE | Freq: Once | ORAL | Status: AC
  Administered 2024-07-18: 200 mg via ORAL
  Filled 2024-07-18: qty 2

## 2024-07-18 MED ORDER — OXYMETAZOLINE HCL 0.05 % NA SOLN
1.0000 | Freq: Once | NASAL | Status: AC
  Administered 2024-07-18: 1 via NASAL
  Filled 2024-07-18: qty 30

## 2024-07-18 NOTE — ED Provider Notes (Signed)
 [facility] Provider Note   CSN: [id number] Arrival date & time: 07/18/24  0020     Patient presents with: flu like symptoms   Grace Daniels is a 33 y.o. female.   The history is provided by the patient.   Grace Daniels is a 33 y.o. female who presents to the Emergency Department complaining of feeling sick. She presents the emergency department for evaluation of symptoms that started three days ago with congestion, runny and stuffy nose, low grade fever and mild cough. No associated vomiting, diarrhea, chest pain, difficulty breathing. She has a history of hypertension, diabetes. She does have a sick contact with similar symptoms.      Prior to Admission medications   Medication Sig Start Date End Date Taking? Authorizing Provider  benzonatate  (TESSALON ) 100 MG capsule Take 1 capsule (100 mg total) by mouth 3 (three) times daily as needed. 07/18/24  Yes Griselda Norris, MD  amLODipine  (NORVASC ) 5 MG tablet TAKE 1 TABLET (5 MG TOTAL) BY MOUTH DAILY. 06/23/24   Oley Bascom RAMAN, NP  atorvastatin  (LIPITOR) 10 MG tablet Take 1 tablet (10 mg total) by mouth daily. 11/25/23   Nichols, Tonya S, NP  Fiber Adult Gummies 2 g CHEW Chew 2 g by mouth daily. 05/12/24   Oley Bascom RAMAN, NP  glipiZIDE  (GLUCOTROL ) 5 MG tablet Take 1 tablet (5 mg total) by mouth daily. 06/14/24   Oley Bascom RAMAN, NP  glucose blood test strip Use as instructed Patient not taking: Reported on 11/25/2023 03/30/22   Oley Bascom RAMAN, NP  ibuprofen  (ADVIL ) 800 MG tablet Take 1 tablet (800 mg total) by mouth every 8 (eight) hours as needed for up to 30 doses. 04/21/24   Cottie Donnice PARAS, MD  lidocaine  (LIDODERM ) 5 % Place 1 patch onto the skin daily. Remove & Discard patch within 12 hours or as directed by MD 05/06/24   Barrett, Jamie N, PA-C  meloxicam  (MOBIC ) 15 MG tablet TAKE 1 TABLET (15 MG TOTAL) BY MOUTH DAILY. 06/14/24   Oley Bascom RAMAN, NP  metFORMIN  (GLUCOPHAGE -XR) 500 MG 24  hr tablet TAKE 2 TABLETS (1,000 MG TOTAL) BY MOUTH 2 (TWO) TIMES DAILY WITH A MEAL. 06/23/24   Oley Bascom RAMAN, NP  Semaglutide ,0.25 or 0.5MG /DOS, 2 MG/3ML SOPN Inject 0.5 mg into the skin once a week. Patient not taking: Reported on 06/27/2024 06/14/24   Oley Bascom RAMAN, NP  triamterene -hydrochlorothiazide (DYAZIDE) 37.5-25 MG capsule TAKE 1 CAPSULE BY MOUTH EVERY DAY 06/26/24   Nichols, Tonya S, NP    Allergies: Patient has no known allergies.    Review of Systems  All other systems reviewed and are negative.   Updated Vital Signs BP (!) 128/95   Pulse 95   Temp (!) 97.5 F (36.4 C) (Oral)   Resp 17   Ht 5' 10 (1.778 m)   Wt (!) 145.2 kg   LMP 06/16/2024   SpO2 99%   BMI 45.92 kg/m   Physical Exam Vitals and nursing note reviewed.  Constitutional:      Appearance: She is well-developed.  HENT:     Head: Normocephalic and atraumatic.  Cardiovascular:     Rate and Rhythm: Normal rate and regular rhythm.     Heart sounds: No murmur heard. Pulmonary:     Effort: Pulmonary effort is normal. No respiratory distress.     Breath sounds: Normal breath sounds.  Abdominal:     Palpations: Abdomen is soft.  Tenderness: There is no abdominal tenderness. There is no guarding or rebound.  Musculoskeletal:        General: No tenderness.  Skin:    General: Skin is warm and dry.  Neurological:     Mental Status: She is alert and oriented to person, place, and time.  Psychiatric:        Behavior: Behavior normal.     (all labs ordered are listed, but only abnormal results are displayed) Labs Reviewed  RESP PANEL BY RT-PCR (RSV, FLU A&B, COVID)  RVPGX2 - Abnormal; Notable for the following components:      Result Value   SARS Coronavirus 2 by RT PCR POSITIVE (*)    All other components within normal limits    EKG: None  Radiology: No results found.   Procedures   Medications Ordered in the ED  oxymetazoline  (AFRIN) 0.05 % nasal spray 1 spray (1 spray Each Nare  Given 07/18/24 0142)  benzonatate  (TESSALON ) capsule 200 mg (200 mg Oral Given 07/18/24 0141)                                    Medical Decision Making Risk OTC drugs. Prescription drug management.   Patient with history of hypertension, diabetes here for evaluation of congestion, stuffy nose, cough. She is positive for COVID-19. No evidence of secondary pneumonia on examination. Do not feel she will benefit from paxlovid at this time. Discussed home care for COVID-19 infection with OTC medications. Will prescribe Tessalon   PRN cough, afrin for congestion.      Final diagnoses:  COVID-19 virus infection    ED Discharge Orders          Ordered    benzonatate  (TESSALON ) 100 MG capsule  3 times daily PRN        07/18/24 0134               Griselda Norris, MD 07/18/24 0222

## 2024-07-18 NOTE — Discharge Instructions (Addendum)
 You may use the nasal spray twice daily for up to three days.

## 2024-07-18 NOTE — ED Triage Notes (Signed)
 Approx 5 days of symptoms.  Her girlfriend and her son also have these symptoms.

## 2024-07-19 ENCOUNTER — Telehealth: Payer: Self-pay | Admitting: Pharmacy Technician

## 2024-07-19 NOTE — Progress Notes (Signed)
   07/19/2024  Patient ID: Grace Daniels, female   DOB: [birth year], 33 y.o.   MRN: [id number]  Patient engaged with clinical pharmacist for management of diabetes on 06/27/2024. Outreach by [employer] technician was requested.   Outreached patient to discuss diabetes medication management. Patient's voicemail picked up but then a message came on stating voicemail box was full so no message was able to be left.  Bryahna Lesko, CPhT [facility] Office: [phone] Email: [email or web address]

## 2024-07-21 ENCOUNTER — Telehealth: Payer: Self-pay | Admitting: Pharmacy Technician

## 2024-07-21 NOTE — Progress Notes (Signed)
   07/21/2024  Patient ID: Grace Daniels, female   DOB: Apr 04, 1991, 33 y.o.   MRN: [id number]  Patient engaged with clinical pharmacist for management of diabetes on 06/27/2024. Outreach by [employer] technician was requested.   Outreached patient to discuss diabetes medication management. Left voicemail for patient to return my call at their convenience.   Marcee Jacobs, CPhT [facility] Office: [phone] Email: [email or web address]

## 2024-07-24 ENCOUNTER — Telehealth: Payer: Self-pay | Admitting: Pharmacy Technician

## 2024-07-24 NOTE — Progress Notes (Signed)
   07/24/2024  Patient ID: Grace Daniels, female   DOB: 01-13-1991, 33 y.o.   MRN: [id number]  Patient engaged with clinical pharmacist for management of diabetes on 06/27/2024. Outreach by [employer] technician was requested.   Outreached patient to discuss diabetes medication management. Left voicemail for patient to return my call at their convenience.   Chinmayi Rumer, CPhT [facility] Office: [phone] Email: [email or web address]

## 2024-08-18 ENCOUNTER — Ambulatory Visit: Payer: Self-pay | Admitting: Nurse Practitioner

## 2024-08-30 ENCOUNTER — Other Ambulatory Visit: Payer: Self-pay | Admitting: Nurse Practitioner

## 2024-08-30 DIAGNOSIS — R7309 Other abnormal glucose: Secondary | ICD-10-CM

## 2024-08-30 NOTE — Telephone Encounter (Unsigned)
 Copied from CRM #[id number]. Topic: Clinical - Medication Refill >> Aug 30, 2024  3:44 PM Suzen RAMAN wrote: Medication: Semaglutide ,0.25 or 0.5MG /DOS, 2 MG/3ML SOPN (Patient not sure if dosage needs to be adjusted)   Has the patient contacted their pharmacy? Yes   This is the patient's preferred pharmacy:  [id number] [place], [place] - [address] AT [address] [place] [address] Phone: [phone] Fax: [phone]  Is this the correct pharmacy for this prescription? Yes If no, delete pharmacy and type the correct one.   Has the prescription been filled recently? No  Is the patient out of the medication? Yes  Has the patient been seen for an appointment in the last year OR does the patient have an upcoming appointment? Yes  Can we respond through MyChart? Yes  Agent: Please be advised that Rx refills may take up to 3 business days. We ask that you follow-up with your pharmacy.

## 2024-08-30 NOTE — Telephone Encounter (Signed)
 Per pt need new dose. KH

## 2024-08-31 ENCOUNTER — Telehealth: Payer: Self-pay | Admitting: Pharmacy Technician

## 2024-08-31 MED ORDER — SEMAGLUTIDE(0.25 OR 0.5MG/DOS) 2 MG/3ML ~~LOC~~ SOPN: 0.5000 mg | PEN_INJECTOR | SUBCUTANEOUS | 5 refills | Status: DC

## 2024-08-31 MED ORDER — SEMAGLUTIDE (1 MG/DOSE) 4 MG/3ML ~~LOC~~ SOPN: 1.0000 mg | PEN_INJECTOR | SUBCUTANEOUS | 2 refills | Status: DC

## 2024-08-31 NOTE — Progress Notes (Signed)
   08/31/2024 Name: KRISNA OMAR MRN: [id number] DOB: 1991-10-26  Patient is appearing for a follow-up visit with the population health pharmacy technician. Last engaged with the clinical pharmacist to discuss diabetes on 06/27/2024. Contacted patient today to discuss diabetes.   Plan from last clinical pharmacist appointment: Diabetes: - Currently uncontrolled per most recent A1Cof 12.4% above goal < 7%. Patient has had difficulties restarting Ozempic  as instructed. She continues to be a good candidate for GLP-1RA given metabolic syndrome and BMI >45. Will collaborate with dispensing pharmacy to facilitate restarting Ozempic . - Patient denies personal or family history of multiple endocrine neoplasia type 2, medullary thyroid  cancer; personal history of pancreatitis or gallbladder disease. - Reviewed long term cardiovascular and renal outcomes of uncontrolled blood sugar - Reviewed goal A1c, goal fasting, and goal 2 hour post prandial glucose - Reviewed dietary modifications including increasing water intake, trying to reduce fast food.  - Reviewed lifestyle modifications including: increasing protein intake at each meal. Reduce intake of foods high in fat or carbohydrates to prevent GI AE with GLP-1. Discussed eating small portions of meals throughout the day once she restarts Ozempic . - Recommend to restart Ozempic  (semaglutide ) 0.25 mg weekly x2 weeks then increase to 0.5 mg weekly x4 weeks.  - Recommend to continue metformin  XR 1000 mg BID and glipizide  5 mg daily.  - [facility] for Ozempic  for $24.99.  - Next A1C due Sept 2025   Given patient's time constraints, did not have time to discuss HTN or HLD today.     Follow Up Plan:  PharmD telephone 07/13/24 PCP 08/18/24   Medication Adherence Barriers Identified:  [facility]  [facility]ill. Per Dr Annemarie, Semaglutide  (Ozempic ) 1 mg/dose (4 mg/3 mL) pen injector,  qty of 3 was filled today. [facility] and spoke to [place] who informs it is ready for her to pick up. The cost is $24.99.   Unsuccessful outreach to patient. HIPAA complaint voicemail was left requesting a return call.   Next clinical pharmacist appointment is scheduled for: 09/05/2024  Kylin Genna, CPhT [facility] Office: [phone] Email: [email or web address]

## 2024-09-05 ENCOUNTER — Other Ambulatory Visit (INDEPENDENT_AMBULATORY_CARE_PROVIDER_SITE_OTHER): Payer: Self-pay

## 2024-09-05 DIAGNOSIS — E119 Type 2 diabetes mellitus without complications: Secondary | ICD-10-CM

## 2024-09-05 DIAGNOSIS — I10 Essential (primary) hypertension: Secondary | ICD-10-CM

## 2024-09-05 MED ORDER — TRIAMTERENE-HCTZ 37.5-25 MG PO CAPS: 1.0000 | ORAL_CAPSULE | Freq: Every day | ORAL | 3 refills | Status: AC

## 2024-09-05 NOTE — Progress Notes (Signed)
 09/05/2024 Name: Grace Daniels MRN: [id number] DOB: 12/24/1990  Chief Complaint  Patient presents with   Diabetes    Grace Daniels is a 33 y.o. year old female who presented for a telephone visit.   They were referred to the pharmacist by their PCP for assistance in managing diabetes. PMH includes HTN, T2DM, OSA, obesity.  Subjective: Patient was last seen by PCP, Grace Borer, Grace Daniels on 05/12/24. BP was elevated to 142/82 mmHg, HR 89 bpm. A1C had increased from 12.4% to 10.4%. She reported stopping Ozempic  and was instructed to restart at 0.25 mg weekly. At pharmacy telephone call on 06/27/24, patient reported she had not taken Ozempic  in 1 mo. She was informed that she could pick up her refill and restart. She was then lost to follow-up and was not able to be contacted by me or [facility] pharmacy tech, Grace Daniels. We did confirm she had refills of Ozempic  on file at the pharmacy that were processing for $24.99/mo.  Today, patient reports doing ok. She says she got notification that her Ozempic  is ready. Last dose of 0.5 mg weekly was last Monday (~8 days ok). She says she will be able to pick up new dose of Ozempic  and resume tomorrow. She is wanting to increase to 1 mg weekly. Reports she tolerated the 0.5 mg dose well.    Care Team: Primary Care Provider: Borer Grace RAMAN, Grace Daniels ; 08/18/24  Medication Access/Adherence  Current Pharmacy:  [id number] [place], [place] - [address] AT [facility] [address] [place] [address] Phone: [phone] Fax: [phone]  [facility] [address], [address] [place] [address] Phone: [phone] Fax: [phone]  [id number] [place], [place] - [address] [place] [address] Phone: [phone] Fax: [phone]   Patient reports affordability concerns with their medications:  No  - Confirmed $25/mo copay for Ozempic  Patient reports access/transportation concerns to their pharmacy: No  Patient reports adherence concerns with their medications:  Yes  - has gone periods of time without Ozempic     Diabetes:  Current medications: metformin  ER 1000 mg BID, glipizide  5 mg daily, semaglutide  (Ozempic ) 0.5 mg subcutaneous weekly (Mondays)  Medications tried in the past: dapagliflozin , dulaglutide , semaglutide  (Ozempic ) - has dc'd in the past due to constipation.  Current glucose readings: not checking BG at home. Does have BG testing supplies.   Patient denies hypoglycemic s/sx including dizziness, shakiness, sweating. Patient denies hyperglycemic symptoms including polyuria, polydipsia, polyphagia, nocturia, neuropathy, blurred vision. No yeast infections since restarting Ozempic .   Current meal patterns: Reports Ozempic  has cause some appetite suppression. Eats twice daily.  Drinks: Reports she does have juice. Having more water lately.   Current physical activity: Active most of the day at work.   Current medication access support: none  Hypertension:  Current medications: amlodipine  5 mg daily (has not been taking this, thought her last BP was controlled), triamterene -hydrochlorothiazide 37.5-25 mg daily   Hyperlipidemia/ASCVD Risk Reduction  Current lipid lowering medications: atorvastatin  10 mg daily   Clinical ASCVD: No  The ASCVD Risk score (Arnett DK, et al., 2019) failed to calculate for the following reasons:   The 2019 ASCVD risk score is only valid for ages 33 to 19     Objective:  BP Readings from Last 3 Encounters:  07/18/24 (!) 128/95  05/12/24 (!) 142/82  05/07/24 ROLLEN)  143/90     Lab Results  Component Value Date   HGBA1C 12.4 (A) 05/12/2024   HGBA1C 10.4 (A) 11/25/2023   HGBA1C 10.5 (A) 03/11/2023   UACR elevated at 30 mg/g (11/25/23)  Lab Results  Component Value Date   CREATININE 0.66 11/25/2023   BUN 8 11/25/2023   NA  137 11/25/2023   K 4.3 11/25/2023   CL 103 11/25/2023   CO2 21 11/25/2023    Lab Results  Component Value Date   CHOL 161 11/25/2023   HDL 37 (L) 11/25/2023   LDLCALC 103 (H) 11/25/2023   TRIG 114 11/25/2023   CHOLHDL 4.4 11/25/2023    Medications Reviewed Today     Reviewed by Grace Daniels, Grace (Pharmacist) on 09/05/24 at 1611  Med List Status: <None>   Medication Order Taking? Sig Documenting Provider Last Dose Status Informant  amLODipine  (NORVASC ) 5 MG tablet [id number]  TAKE 1 TABLET (5 MG TOTAL) BY MOUTH DAILY.  Patient not taking: Reported on 09/05/2024   Oley Grace RAMAN, Grace Daniels  Active   atorvastatin  (LIPITOR) 10 MG tablet [id number] Yes Take 1 tablet (10 mg total) by mouth daily. Oley Grace RAMAN, Grace Daniels  Active   benzonatate  (TESSALON ) 100 MG capsule [id number]  Take 1 capsule (100 mg total) by mouth 3 (three) times daily as needed. Grace Norris, Grace Daniels  Active   Fiber Adult Gummies 2 g CHEW [id number]  Chew 2 g by mouth daily. Oley Grace RAMAN, Grace Daniels  Active   glipiZIDE  (GLUCOTROL ) 5 MG tablet [id number] Yes Take 1 tablet (5 mg total) by mouth daily. Oley Grace RAMAN, Grace Daniels  Active   glucose blood test strip [id number]  Use as instructed  Patient not taking: Reported on 11/25/2023   Oley Grace RAMAN, Grace Daniels  Active   ibuprofen  (ADVIL ) 800 MG tablet [id number]  Take 1 tablet (800 mg total) by mouth every 8 (eight) hours as needed for up to 30 doses. Grace Donnice PARAS, Grace Daniels  Active   lidocaine  (LIDODERM ) 5 % [id number]  Place 1 patch onto the skin daily. Remove & Discard patch within 12 hours or as directed by Grace Daniels Barrett, Jamie N, PA-C  Active   meloxicam  (MOBIC ) 15 MG tablet [id number]  TAKE 1 TABLET (15 MG TOTAL) BY MOUTH DAILY. Oley Grace RAMAN, Grace Daniels  Active   metFORMIN  (GLUCOPHAGE -XR) 500 MG 24 hr tablet [id number] Yes TAKE 2 TABLETS (1,000 MG TOTAL) BY MOUTH 2 (TWO) TIMES DAILY WITH A MEAL. Oley Grace RAMAN, Grace Daniels  Active   Semaglutide , 1 MG/DOSE, 4 MG/3ML SOPN [id number]  Inject 1 mg into the skin once a  week.  Patient not taking: Reported on 09/05/2024   Oley Grace RAMAN, Grace Daniels  Active   triamterene -hydrochlorothiazide (DYAZIDE) 37.5-25 MG capsule [id number]  Take 1 each (1 capsule total) by mouth daily. Oley Grace RAMAN, Grace Daniels  Active             Assessment/Plan:   Diabetes: - Currently uncontrolled per most recent A1Cof 12.4% above goal < 7%. She continues to be a good candidate for GLP-1RA given metabolic syndrome and BMI >45. She says she has been consistently taking Ozempic  0.5 mg weekly for the past 4 weeks, despite discrepancy in fill hx. She would like to increase to Ozempic  1 mg weekly, which has already been sent to the pharmacy by her PCP. Educated her to reach out to the clinic if she has side effects or feels she needs to slow the titration.  - Patient denies personal or family  history of multiple endocrine neoplasia type 2, medullary thyroid  cancer; personal history of pancreatitis or gallbladder disease. - Reviewed long term cardiovascular and renal outcomes of uncontrolled blood sugar - Reviewed goal A1c, goal fasting, and goal 2 hour post prandial glucose - Reviewed dietary modifications including increasing water intake, trying to reduce fast food.  - Reviewed lifestyle modifications including: increasing protein intake at each meal. Reduce intake of foods high in fat or carbohydrates to prevent GI AE with GLP-1. Discussed eating small portions of meals throughout the day once she restarts Ozempic . - Recommend to increase Ozempic  to 1 mg weekly as previously prescribed.  - Recommend to continue metformin  XR 1000 mg BID and glipizide  5 mg daily.  - Next A1C due now. If next A1C is > 8%, patient may be a good candidate for the LIBERATE study.   Follow Up Plan:  PharmD telephone 10/24/24 PCP 09/15/24   Lorain Baseman, PharmD [facility] [phone]

## 2024-09-15 ENCOUNTER — Ambulatory Visit (INDEPENDENT_AMBULATORY_CARE_PROVIDER_SITE_OTHER): Payer: Self-pay | Admitting: Nurse Practitioner

## 2024-09-15 ENCOUNTER — Encounter: Payer: Self-pay | Admitting: Nurse Practitioner

## 2024-09-15 VITALS — BP 133/86 | HR 88 | Wt 321.6 lb

## 2024-09-15 DIAGNOSIS — M545 Low back pain, unspecified: Secondary | ICD-10-CM | POA: Diagnosis not present

## 2024-09-15 DIAGNOSIS — E119 Type 2 diabetes mellitus without complications: Secondary | ICD-10-CM | POA: Diagnosis not present

## 2024-09-15 LAB — POCT URINE DIPSTICK
Bilirubin, UA: NEGATIVE
Glucose, UA: NEGATIVE mg/dL
Ketones, POC UA: NEGATIVE mg/dL
Leukocytes, UA: NEGATIVE
Nitrite, UA: NEGATIVE
POC PROTEIN,UA: NEGATIVE
Spec Grav, UA: >=1.03 — AB (ref 1.010–1.025)
Urobilinogen, UA: 0.2 U/dL
pH, UA: 6 (ref 5.0–8.0)

## 2024-09-15 LAB — POCT GLYCOSYLATED HEMOGLOBIN (HGB A1C): Hemoglobin A1C: 8.5 % — AB (ref 4.0–5.6)

## 2024-09-15 MED ORDER — CYCLOBENZAPRINE HCL 10 MG PO TABS: 10.0000 mg | ORAL_TABLET | Freq: Three times a day (TID) | ORAL | 0 refills | Status: DC | PRN

## 2024-09-15 NOTE — Progress Notes (Signed)
 Subjective   Patient ID: Grace Daniels, female    DOB: [birth year], 33 y.o.   MRN: [id number]  Chief Complaint  Patient presents with   Diabetes   Flank Pain    Says kidneys have been giving her problem     Referring provider: Oley Bascom RAMAN, NP  Grace Daniels is a 33 y.o. female with Past Medical History: 11/2020: Coronavirus infection No date: Diabetes (HCC) No date: Hyperlipidemia No date: Hypertension No date: Morbid obesity (HCC) 05/2020: Right knee pain 05/2020: Right thigh pain No date: Sleep apnea 01/2021: Vitamin D  deficiency   HPI  Patient presents today for follow-up visit.  Overall she has been doing well.  A1c today in office was 8.5.  Patient states that she has stopped her Ozempic .  We will reorder this today.  We are also going to add amlodipine  5 mg daily for blood pressure.  Denies f/c/s, n/v/d, hemoptysis, PND, leg swelling. Denies chest pain or edema.  Note: Patient complains today of low back pain.  UA in office today was overall negative.  Will trial muscle relaxer.  Patient states that she has been sleeping on an uncomfortable bed recently.   No Known Allergies  Immunization History  Administered Date(s) Administered   Tdap 03/26/2022    Tobacco History: Social History   Tobacco Use  Smoking Status Never   Passive exposure: Never  Smokeless Tobacco Never   Counseling given: Not Answered   Outpatient Encounter Medications as of 09/15/2024  Medication Sig   cyclobenzaprine  (FLEXERIL ) 10 MG tablet Take 1 tablet (10 mg total) by mouth 3 (three) times daily as needed for muscle spasms.   amLODipine  (NORVASC ) 5 MG tablet TAKE 1 TABLET (5 MG TOTAL) BY MOUTH DAILY. (Patient not taking: Reported on 09/05/2024)   atorvastatin  (LIPITOR) 10 MG tablet Take 1 tablet (10 mg total) by mouth daily.   benzonatate  (TESSALON ) 100 MG capsule Take 1 capsule (100 mg total) by mouth 3 (three) times daily as needed.   Fiber Adult Gummies 2 g CHEW Chew 2 g  by mouth daily.   glipiZIDE  (GLUCOTROL ) 5 MG tablet Take 1 tablet (5 mg total) by mouth daily.   glucose blood test strip Use as instructed (Patient not taking: Reported on 11/25/2023)   ibuprofen  (ADVIL ) 800 MG tablet Take 1 tablet (800 mg total) by mouth every 8 (eight) hours as needed for up to 30 doses.   lidocaine  (LIDODERM ) 5 % Place 1 patch onto the skin daily. Remove & Discard patch within 12 hours or as directed by MD   meloxicam  (MOBIC ) 15 MG tablet TAKE 1 TABLET (15 MG TOTAL) BY MOUTH DAILY.   metFORMIN  (GLUCOPHAGE -XR) 500 MG 24 hr tablet TAKE 2 TABLETS (1,000 MG TOTAL) BY MOUTH 2 (TWO) TIMES DAILY WITH A MEAL.   Semaglutide , 1 MG/DOSE, 4 MG/3ML SOPN Inject 1 mg into the skin once a week. (Patient not taking: Reported on 09/05/2024)   triamterene -hydrochlorothiazide (DYAZIDE) 37.5-25 MG capsule Take 1 each (1 capsule total) by mouth daily.   No facility-administered encounter medications on file as of 09/15/2024.    Review of Systems  Review of Systems  Constitutional: Negative.   HENT: Negative.    Cardiovascular: Negative.   Gastrointestinal: Negative.   Allergic/Immunologic: Negative.   Neurological: Negative.   Psychiatric/Behavioral: Negative.       Objective:   BP 133/86 (BP Location: Right Arm, Patient Position: Sitting)   Pulse 88   Wt (!) 321 lb 9.6 oz (145.9 kg)  SpO2 97%   BMI 46.14 kg/m   Wt Readings from Last 5 Encounters:  09/15/24 (!) 321 lb 9.6 oz (145.9 kg)  07/18/24 (!) 320 lb (145.2 kg)  05/12/24 (!) 324 lb 12.8 oz (147.3 kg)  05/06/24 (!) 330 lb (149.7 kg)  04/21/24 (!) 336 lb 13.8 oz (152.8 kg)     Physical Exam Vitals and nursing note reviewed.  Constitutional:      General: She is not in acute distress.    Appearance: She is well-developed.  Cardiovascular:     Rate and Rhythm: Regular rhythm.  Pulmonary:     Effort: Pulmonary effort is normal.     Breath sounds: Normal breath sounds.  Neurological:     Mental Status: She is alert  and oriented to person, place, and time.       Assessment & Plan:   Type 2 diabetes mellitus without complication, without long-term current use of insulin  (HCC) -     POCT glycosylated hemoglobin (Hb A1C) -     POCT URINE DIPSTICK -     CBC -     Comprehensive metabolic panel with GFR  Acute left-sided low back pain without sciatica -     Cyclobenzaprine  HCl; Take 1 tablet (10 mg total) by mouth 3 (three) times daily as needed for muscle spasms.  Dispense: 30 tablet; Refill: 0     Return in about 3 months (around 12/16/2024).   Bascom GORMAN Borer, NP 09/15/2024

## 2024-09-16 LAB — COMPREHENSIVE METABOLIC PANEL WITH GFR
ALT: 10 IU/L (ref 0–32)
AST: 10 IU/L (ref 0–40)
Albumin: 4.2 g/dL (ref 3.9–4.9)
Alkaline Phosphatase: 73 IU/L (ref 41–116)
BUN/Creatinine Ratio: 19 (ref 9–23)
BUN: 12 mg/dL (ref 6–20)
Bilirubin Total: 0.3 mg/dL (ref 0.0–1.2)
CO2: 21 mmol/L (ref 20–29)
Calcium: 9.7 mg/dL (ref 8.7–10.2)
Chloride: 101 mmol/L (ref 96–106)
Creatinine, Ser: 0.64 mg/dL (ref 0.57–1.00)
Globulin, Total: 3.5 g/dL (ref 1.5–4.5)
Glucose: 148 mg/dL — ABNORMAL HIGH (ref 70–99)
Potassium: 4.3 mmol/L (ref 3.5–5.2)
Sodium: 136 mmol/L (ref 134–144)
Total Protein: 7.7 g/dL (ref 6.0–8.5)
eGFR: 119 mL/min/1.73 (ref >59)

## 2024-09-16 LAB — CBC
Hematocrit: 39.2 % (ref 34.0–46.6)
Hemoglobin: 12 g/dL (ref 11.1–15.9)
MCH: 21.9 pg — ABNORMAL LOW (ref 26.6–33.0)
MCHC: 30.6 g/dL — ABNORMAL LOW (ref 31.5–35.7)
MCV: 71 fL — ABNORMAL LOW (ref 79–97)
Platelets: 375 x10E3/uL (ref 150–450)
RBC: 5.49 x10E6/uL — ABNORMAL HIGH (ref 3.77–5.28)
RDW: 17.1 % — ABNORMAL HIGH (ref 11.7–15.4)
WBC: 10.5 x10E3/uL (ref 3.4–10.8)

## 2024-09-18 ENCOUNTER — Ambulatory Visit: Payer: Self-pay | Admitting: Nurse Practitioner

## 2024-10-24 ENCOUNTER — Other Ambulatory Visit: Payer: Self-pay

## 2024-10-24 ENCOUNTER — Telehealth: Payer: Self-pay

## 2024-10-24 NOTE — Telephone Encounter (Signed)
 Attempted to contact patient for scheduled appointment for medication management. Left HIPAA compliant message for patient to return my call at their convenience.   Lorain Baseman, PharmD [facility] [phone]

## 2024-10-24 NOTE — Progress Notes (Deleted)
 10/24/2024 Name: Grace Daniels MRN: [id number] DOB: 03-18-1991  No chief complaint on file.   Grace Daniels is a 33 y.o. year old female who presented for a telephone visit.   They were referred to the pharmacist by their PCP for assistance in managing diabetes. PMH includes HTN, T2DM, OSA, obesity.  Subjective: Patient was last seen by PCP, Bascom Borer, NP on 05/12/24. BP was elevated to 142/82 mmHg, HR 89 bpm. A1C had increased from 12.4% to 10.4%. She reported stopping Ozempic  and was instructed to restart at 0.25 mg weekly. At pharmacy telephone call on 06/27/24, patient reported she had not taken Ozempic  in 1 mo. She was informed that she could pick up her refill and restart. She was then lost to follow-up and was not able to be contacted by me or [facility] pharmacy tech, Jill Simcox. We did confirm she had refills of Ozempic  on file at the pharmacy that were processing for $24.99/mo.  Today, patient reports doing ok. She says she got notification that her Ozempic  is ready. Last dose of 0.5 mg weekly was last Monday (~8 days ok). She says she will be able to pick up new dose of Ozempic  and resume tomorrow. She is wanting to increase to 1 mg weekly. Reports she tolerated the 0.5 mg dose well.    Care Team: Primary Care Provider: Borer Bascom RAMAN, NP ; 08/18/24  Medication Access/Adherence  Current Pharmacy:  [id number] [place], [place] - [address] AT [facility] [address] [place] [address] Phone: [phone] Fax: [phone]  [facility] - [facility] [address], [address] [place] [address] Phone: [phone] Fax: [phone]  [id number] [place], [place] - [address] [place] [address] Phone: [phone] Fax: [phone]   Patient reports affordability concerns with their medications: No  - Confirmed $25/mo  copay for Ozempic  Patient reports access/transportation concerns to their pharmacy: No  Patient reports adherence concerns with their medications:  Yes  - has gone periods of time without Ozempic     Diabetes:  Current medications: metformin  ER 1000 mg BID, glipizide  5 mg daily, semaglutide  (Ozempic ) 0.5 mg subcutaneous weekly (Mondays)  Medications tried in the past: dapagliflozin , dulaglutide , semaglutide  (Ozempic ) - has dc'd in the past due to constipation.  Current glucose readings: not checking BG at home. Does have BG testing supplies.   Patient denies hypoglycemic s/sx including dizziness, shakiness, sweating. Patient denies hyperglycemic symptoms including polyuria, polydipsia, polyphagia, nocturia, neuropathy, blurred vision. No yeast infections since restarting Ozempic .   Current meal patterns: Reports Ozempic  has cause some appetite suppression. Eats twice daily.  Drinks: Reports she does have juice. Having more water lately.   Current physical activity: Active most of the day at work.   Current medication access support: none  Hypertension:  Current medications: amlodipine  5 mg daily (has not been taking this, thought her last BP was controlled), triamterene -hydrochlorothiazide 37.5-25 mg daily   Hyperlipidemia/ASCVD Risk Reduction  Current lipid lowering medications: atorvastatin  10 mg daily   Clinical ASCVD: No  The ASCVD Risk score (Arnett DK, et al., 2019) failed to calculate for the following reasons:   The 2019 ASCVD risk score is only valid for ages 67 to 74     Objective:  BP Readings from Last 3 Encounters:  09/15/24 133/86  07/18/24 (!) 128/95  05/12/24 (!) 142/82  Lab Results  Component Value Date   HGBA1C 8.5 (A) 09/15/2024   HGBA1C 12.4 (A) 05/12/2024   HGBA1C 10.4 (A) 11/25/2023   UACR elevated at 30 mg/g (11/25/23)  Lab Results  Component Value Date   CREATININE 0.64 09/15/2024   BUN 12 09/15/2024   NA 136 09/15/2024   K 4.3  09/15/2024   CL 101 09/15/2024   CO2 21 09/15/2024    Lab Results  Component Value Date   CHOL 161 11/25/2023   HDL 37 (L) 11/25/2023   LDLCALC 103 (H) 11/25/2023   TRIG 114 11/25/2023   CHOLHDL 4.4 11/25/2023    Medications Reviewed Today   Medications were not reviewed in this encounter     Assessment/Plan:   Diabetes: - Currently uncontrolled per most recent A1Cof 12.4% above goal < 7%. She continues to be a good candidate for GLP-1RA given metabolic syndrome and BMI >45. She says she has been consistently taking Ozempic  0.5 mg weekly for the past 4 weeks, despite discrepancy in fill hx. She would like to increase to Ozempic  1 mg weekly, which has already been sent to the pharmacy by her PCP. Educated her to reach out to the clinic if she has side effects or feels she needs to slow the titration.  - Patient denies personal or family history of multiple endocrine neoplasia type 2, medullary thyroid  cancer; personal history of pancreatitis or gallbladder disease. - Reviewed long term cardiovascular and renal outcomes of uncontrolled blood sugar - Reviewed goal A1c, goal fasting, and goal 2 hour post prandial glucose - Reviewed dietary modifications including increasing water intake, trying to reduce fast food.  - Reviewed lifestyle modifications including: increasing protein intake at each meal. Reduce intake of foods high in fat or carbohydrates to prevent GI AE with GLP-1. Discussed eating small portions of meals throughout the day once she restarts Ozempic . - Recommend to increase Ozempic  to 1 mg weekly as previously prescribed.  - Recommend to continue metformin  XR 1000 mg BID and glipizide  5 mg daily.  - Next A1C due now. If next A1C is > 8%, patient may be a good candidate for the LIBERATE study.   Follow Up Plan:  PharmD telephone 10/24/24 PCP 09/15/24   Lorain Baseman, PharmD [facility] [phone]

## 2024-10-27 ENCOUNTER — Telehealth: Payer: Self-pay | Admitting: Pharmacy Technician

## 2024-10-27 NOTE — Progress Notes (Signed)
° °  10/27/2024 Name: Grace Daniels MRN: [id number] DOB: 07/05/1991  Patient is appearing for a follow-up visit with the population health pharmacy technician. Last engaged with the clinical pharmacist to discuss diabetes on 09/05/2024. Contacted patient today to discuss diabetes, medication adherence, and medication access.   Plan from last clinical pharmacist appointment:  Diabetes: - Currently uncontrolled per most recent A1Cof 12.4% above goal < 7%. She continues to be a good candidate for GLP-1RA given metabolic syndrome and BMI >45. She says she has been consistently taking Ozempic  0.5 mg weekly for the past 4 weeks, despite discrepancy in fill hx. She would like to increase to Ozempic  1 mg weekly, which has already been sent to the pharmacy by her PCP. Educated her to reach out to the clinic if she has side effects or feels she needs to slow the titration.  - Patient denies personal or family history of multiple endocrine neoplasia type 2, medullary thyroid  cancer; personal history of pancreatitis or gallbladder disease. - Reviewed long term cardiovascular and renal outcomes of uncontrolled blood sugar - Reviewed goal A1c, goal fasting, and goal 2 hour post prandial glucose - Reviewed dietary modifications including increasing water intake, trying to reduce fast food.  - Reviewed lifestyle modifications including: increasing protein intake at each meal. Reduce intake of foods high in fat or carbohydrates to prevent GI AE with GLP-1. Discussed eating small portions of meals throughout the day once she restarts Ozempic . - Recommend to increase Ozempic  to 1 mg weekly as previously prescribed.  - Recommend to continue metformin  XR 1000 mg BID and glipizide  5 mg daily.  - Next A1C due now. If next A1C is > 8%, patient may be a good candidate for the LIBERATE study. Follow Up Plan:  PharmD telephone 10/24/24 PCP 10/31/2(copy/paste from last note)   Medication Adherence Barriers  Identified:  Access issues with any new medication or testing device: Yes Ozempic    Medication Adherence Barriers Addressed/Actions Taken:  Reviewed medication changes per plan from last clinical pharmacist note Medication Access for Ozempic  Will discuss medication access concerns with pharmacist, Patient appears past due on maintenance blood pressure, cholesterol and other diabetes medications. Contacted pharmacy regarding Ozempic  refill as requested by PharmD. Spoke to Thom who was able to refill the Ozempic  prescription. Charge for Ozempic  will be $24.99 per Thom. Per Thom patient will receive a text message when it is ready to be picked up. Outreached patient. A lady answered the phone and informed patient was not at home but would be back tomorrow. Informed her that we would try next week as we are not opened on the weekends and she said okay.   Next clinical pharmacist appointment is scheduled for: TBD  Kate Caddy, CPhT [facility] Office: [phone] Email: [email or web address]

## 2024-10-30 ENCOUNTER — Telehealth: Payer: Self-pay | Admitting: Pharmacy Technician

## 2024-10-30 NOTE — Progress Notes (Signed)
° °  10/30/2024  Patient ID: Grace Daniels, female   DOB: 06-06-1991, 33 y.o.   MRN: [id number]  Patient engaged with clinical pharmacist for management of diabetes on 09/05/2024. Outreach by [employer] technician was requested.   Outreached patient to discuss diabetes and medication access medication management. Left voicemail for patient to return my call at their convenience  Grace Daniels, Grace Daniels Office: [phone] Email: [email or web address] .

## 2024-11-02 ENCOUNTER — Telehealth: Payer: Self-pay | Admitting: Pharmacy Technician

## 2024-11-02 NOTE — Progress Notes (Signed)
° °  11/02/2024  Patient ID: Dama CINDERELLA Sharps, female   DOB: 1991/03/11, 33 y.o.   MRN: [id number]  Patient engaged with clinical pharmacist for management of diabetes on 09/05/2024. Outreach by [employer] technician was requested.   Outreached patient to discuss diabetes medication management. Left voicemail for patient to return my call at their convenience.    Keven Osborn, CPhT [place] Office: [phone] Email: [email or web address]

## 2024-11-05 ENCOUNTER — Other Ambulatory Visit: Payer: Self-pay

## 2024-11-05 DIAGNOSIS — B9789 Other viral agents as the cause of diseases classified elsewhere: Secondary | ICD-10-CM | POA: Insufficient documentation

## 2024-11-05 DIAGNOSIS — H6502 Acute serous otitis media, left ear: Secondary | ICD-10-CM | POA: Insufficient documentation

## 2024-11-05 DIAGNOSIS — E119 Type 2 diabetes mellitus without complications: Secondary | ICD-10-CM | POA: Insufficient documentation

## 2024-11-05 DIAGNOSIS — R509 Fever, unspecified: Secondary | ICD-10-CM | POA: Diagnosis not present

## 2024-11-05 DIAGNOSIS — I10 Essential (primary) hypertension: Secondary | ICD-10-CM | POA: Diagnosis not present

## 2024-11-05 DIAGNOSIS — J028 Acute pharyngitis due to other specified organisms: Secondary | ICD-10-CM | POA: Diagnosis not present

## 2024-11-05 DIAGNOSIS — Z7984 Long term (current) use of oral hypoglycemic drugs: Secondary | ICD-10-CM | POA: Diagnosis not present

## 2024-11-06 ENCOUNTER — Emergency Department (HOSPITAL_BASED_OUTPATIENT_CLINIC_OR_DEPARTMENT_OTHER)
Admission: EM | Admit: 2024-11-06 | Discharge: 2024-11-06 | Disposition: A | Discharge status: Home / Self Care | Attending: Emergency Medicine | Admitting: Emergency Medicine

## 2024-11-06 ENCOUNTER — Other Ambulatory Visit: Payer: Self-pay

## 2024-11-06 DIAGNOSIS — H6502 Acute serous otitis media, left ear: Secondary | ICD-10-CM

## 2024-11-06 DIAGNOSIS — J029 Acute pharyngitis, unspecified: Secondary | ICD-10-CM

## 2024-11-06 LAB — RESP PANEL BY RT-PCR (RSV, FLU A&B, COVID)  RVPGX2
Influenza A by PCR: NEGATIVE
Influenza B by PCR: NEGATIVE
Resp Syncytial Virus by PCR: NEGATIVE
SARS Coronavirus 2 by RT PCR: NEGATIVE

## 2024-11-06 NOTE — ED Provider Notes (Signed)
 "  [facility]  Provider Note  CSN: [id number] Arrival date & time: 11/05/24 2351  History Chief Complaint  Patient presents with   Sore Throat    Grace Daniels is a 33 y.o. female with history of HTN, DM, OSA reports sore throat for about a week since returning from a trip [place]. She has also had some L ear pain Subjective fevers. Has been taking OTC medications without much improvement.    Home Medications Prior to Admission medications  Medication Sig Start Date End Date Taking? Authorizing Provider  amLODipine  (NORVASC ) 5 MG tablet TAKE 1 TABLET (5 MG TOTAL) BY MOUTH DAILY. Patient not taking: Reported on 09/05/2024 06/23/24   Oley Bascom RAMAN, NP  atorvastatin  (LIPITOR) 10 MG tablet Take 1 tablet (10 mg total) by mouth daily. 11/25/23   Oley Bascom RAMAN, NP  benzonatate  (TESSALON ) 100 MG capsule Take 1 capsule (100 mg total) by mouth 3 (three) times daily as needed. 07/18/24   Griselda Norris, MD  cyclobenzaprine  (FLEXERIL ) 10 MG tablet Take 1 tablet (10 mg total) by mouth 3 (three) times daily as needed for muscle spasms. 09/15/24   Nichols, Tonya S, NP  Fiber Adult Gummies 2 g CHEW Chew 2 g by mouth daily. 05/12/24   Oley Bascom RAMAN, NP  glipiZIDE  (GLUCOTROL ) 5 MG tablet Take 1 tablet (5 mg total) by mouth daily. 06/14/24   Oley Bascom RAMAN, NP  glucose blood test strip Use as instructed Patient not taking: Reported on 11/25/2023 03/30/22   Oley Bascom RAMAN, NP  ibuprofen  (ADVIL ) 800 MG tablet Take 1 tablet (800 mg total) by mouth every 8 (eight) hours as needed for up to 30 doses. 04/21/24   Cottie Donnice PARAS, MD  lidocaine  (LIDODERM ) 5 % Place 1 patch onto the skin daily. Remove & Discard patch within 12 hours or as directed by MD 05/06/24   Barrett, Warren SAILOR, PA-C  meloxicam  (MOBIC ) 15 MG tablet TAKE 1 TABLET (15 MG TOTAL) BY MOUTH DAILY. 06/14/24   Oley Bascom RAMAN, NP  metFORMIN  (GLUCOPHAGE -XR) 500 MG 24 hr tablet TAKE 2 TABLETS (1,000 MG  TOTAL) BY MOUTH 2 (TWO) TIMES DAILY WITH A MEAL. 06/23/24   Oley Bascom RAMAN, NP  Semaglutide , 1 MG/DOSE, 4 MG/3ML SOPN Inject 1 mg into the skin once a week. Patient not taking: Reported on 09/05/2024 08/31/24   Oley Bascom RAMAN, NP  triamterene -hydrochlorothiazide (DYAZIDE) 37.5-25 MG capsule Take 1 each (1 capsule total) by mouth daily. 09/05/24   Oley Bascom RAMAN, NP     Allergies    Patient has no known allergies.   Review of Systems   Review of Systems Please see HPI for pertinent positives and negatives  Physical Exam BP (!) 145/107   Pulse 91   Temp 98.5 F (36.9 C) (Oral)   Resp 18   Ht 5' 10 (1.778 m)   Wt (!) 145.2 kg   LMP 09/15/2024 (Approximate)   SpO2 97%   BMI 45.92 kg/m   Physical Exam Vitals and nursing note reviewed.  Constitutional:      Appearance: Normal appearance.  HENT:     Head: Normocephalic and atraumatic.     Right Ear: Tympanic membrane normal.     Left Ear: No drainage. A middle ear effusion is present. Tympanic membrane is not erythematous.     Nose: Nose normal.     Mouth/Throat:     Mouth: Mucous membranes are moist.  Eyes:  Extraocular Movements: Extraocular movements intact.     Conjunctiva/sclera: Conjunctivae normal.  Cardiovascular:     Rate and Rhythm: Normal rate.  Pulmonary:     Effort: Pulmonary effort is normal.     Breath sounds: Normal breath sounds.  Abdominal:     General: Abdomen is flat.     Palpations: Abdomen is soft.     Tenderness: There is no abdominal tenderness.  Musculoskeletal:        General: No swelling. Normal range of motion.     Cervical back: Neck supple.  Skin:    General: Skin is warm and dry.  Neurological:     General: No focal deficit present.     Mental Status: She is alert.  Psychiatric:        Mood and Affect: Mood normal.     ED Results / Procedures / Treatments   EKG None  Procedures Procedures  Medications Ordered in the ED Medications - No data to display  Initial  Impression and Plan  Patient here with viral URI symptoms. Exam and vitals are reassuring. Covid/Flu/RSV swab collected in triage is pending.   ED Course   Clinical Course as of 11/06/24 0128  Mon Nov 06, 2024  0120 Covid/Flu/RSV swab is neg. Recommend Coricidin for ear effusion, APAP/Motrin  for pain. PCP follow up, RTED for any other concerns.   [CS]    Clinical Course User Index [CS] Roselyn Carlin NOVAK, MD     MDM Rules/Calculators/A&P Medical Decision Making Problems Addressed: Acute serous otitis media of left ear, recurrence not specified: acute illness or injury Viral pharyngitis: acute illness or injury  Amount and/or Complexity of Data Reviewed Labs: ordered. Decision-making details documented in ED Course.  Risk OTC drugs.     Final Clinical Impression(s) / ED Diagnoses Final diagnoses:  Acute serous otitis media of left ear, recurrence not specified  Viral pharyngitis    Rx / DC Orders ED Discharge Orders     None        Roselyn Carlin NOVAK, MD 11/06/24 0128  "

## 2024-11-06 NOTE — ED Triage Notes (Signed)
 Pt from home via POV.  Pt reports fever, cough, sinusitis, sore throat, starting x2 weeks ago.  Pt reports pain in L ear.

## 2024-12-20 ENCOUNTER — Ambulatory Visit: Payer: Self-pay

## 2024-12-20 NOTE — Telephone Encounter (Signed)
 FYI Only or Action Required?: FYI only for provider: appointment scheduled on 12/21/24.  Patient was last seen in primary care on 09/15/2024 by Oley Bascom RAMAN, NP.  Called Nurse Triage reporting Back Pain and Dysuria.  Symptoms began several days ago.  Interventions attempted: Nothing.  Symptoms are: gradually worsening.  Triage Disposition: See HCP Within 4 Hours (Or PCP Triage)  Patient/caregiver understands and will follow disposition?: No, refuses disposition   Reason for Disposition  Side (flank) or lower back pain present  Answer Assessment - Initial Assessment Questions Patient states that she started to experience a discomfort with urination a few days ago. She is now experiencing lower back pain and concerned for a UTI. She also notes that she saw blood on the toilet paper when urinating, but isn't sure if this is from her menstrual cycle coming on. Office visit already scheduled for tomorrow morning, but advised to go to UC with symptoms. States that she is unable to go because she is working. Would like to address this with PCP tomorrow. She would also like to discuss recent breast pain she has been feeling in the left breast.   1. SEVERITY: How bad is the pain?  (e.g., Scale 1-10; mild, moderate, or severe)     4/10 discomfort  2. FREQUENCY: How many times have you had painful urination today?      With each occurrence  3. PATTERN: Is pain present every time you urinate or just sometimes?      Yes  4. ONSET: When did the painful urination start?      A few days ago  5. FEVER: Do you have a fever? If Yes, ask: What is your temperature, how was it measured, and when did it start?     No  6. PAST UTI: Have you had a urine infection before? If Yes, ask: When was the last time? and What happened that time?      Unknown  7. CAUSE: What do you think is causing the painful urination?  (e.g., UTI, scratch, Herpes sore)     UTI  8. OTHER SYMPTOMS: Do  you have any other symptoms? (e.g., blood in urine, flank pain, genital sores, urgency, vaginal discharge)     Lower back pain, mentions seeing blood on toilet paper but not sure if it's from menstrual cycle   9. PREGNANCY: Is there any chance you are pregnant? When was your last menstrual period?     Unknown  Protocols used: Urination Pain - Female-A-AH  Reason for Triage: back pain, possible UTI. When wiping, she sees blood

## 2024-12-21 ENCOUNTER — Ambulatory Visit: Payer: Self-pay | Admitting: Nurse Practitioner

## 2024-12-21 ENCOUNTER — Encounter: Payer: Self-pay | Admitting: Nurse Practitioner

## 2024-12-21 VITALS — BP 134/77 | HR 93 | Temp 98.2°F | Wt 326.0 lb

## 2024-12-21 DIAGNOSIS — Z1329 Encounter for screening for other suspected endocrine disorder: Secondary | ICD-10-CM | POA: Diagnosis not present

## 2024-12-21 DIAGNOSIS — M545 Low back pain, unspecified: Secondary | ICD-10-CM

## 2024-12-21 DIAGNOSIS — N644 Mastodynia: Secondary | ICD-10-CM | POA: Diagnosis not present

## 2024-12-21 DIAGNOSIS — Z13220 Encounter for screening for lipoid disorders: Secondary | ICD-10-CM

## 2024-12-21 DIAGNOSIS — G8929 Other chronic pain: Secondary | ICD-10-CM

## 2024-12-21 DIAGNOSIS — Z124 Encounter for screening for malignant neoplasm of cervix: Secondary | ICD-10-CM

## 2024-12-21 DIAGNOSIS — R350 Frequency of micturition: Secondary | ICD-10-CM | POA: Diagnosis not present

## 2024-12-21 DIAGNOSIS — E119 Type 2 diabetes mellitus without complications: Secondary | ICD-10-CM | POA: Diagnosis not present

## 2024-12-21 LAB — POCT GLYCOSYLATED HEMOGLOBIN (HGB A1C): Hemoglobin A1C: 9.2 % — AB (ref 4.0–5.6)

## 2024-12-21 LAB — POCT URINE DIPSTICK
Bilirubin, UA: NEGATIVE
Blood, UA: NEGATIVE
Glucose, UA: >=1000 mg/dL — AB
Ketones, POC UA: NEGATIVE mg/dL
Leukocytes, UA: NEGATIVE
Nitrite, UA: NEGATIVE
POC PROTEIN,UA: NEGATIVE
Spec Grav, UA: 1.01
Urobilinogen, UA: 0.2 U/dL
pH, UA: 5.5

## 2024-12-21 MED ORDER — CYCLOBENZAPRINE HCL 10 MG PO TABS: 10.0000 mg | ORAL_TABLET | Freq: Three times a day (TID) | ORAL | 0 refills | Status: AC | PRN

## 2024-12-21 MED ORDER — KETOROLAC TROMETHAMINE 60 MG/2ML IM SOLN
60.0000 mg | Freq: Once | INTRAMUSCULAR | Status: AC
  Administered 2024-12-21: 60 mg via INTRAMUSCULAR

## 2024-12-21 MED ORDER — TIRZEPATIDE 2.5 MG/0.5ML ~~LOC~~ SOAJ: 2.5000 mg | SUBCUTANEOUS | 2 refills | Status: AC

## 2024-12-21 MED ORDER — GLUCOSE BLOOD VI STRP: ORAL_STRIP | 12 refills | Status: AC

## 2024-12-21 NOTE — Telephone Encounter (Signed)
 Pt will be seen today. Grace Daniels

## 2024-12-21 NOTE — Progress Notes (Signed)
 "  Subjective   Patient ID: Grace Daniels, female    DOB: 28-Feb-1991, 34 y.o.   MRN: [id number]  Chief Complaint  Patient presents with   Diabetes   Urinary Frequency    X 1 week.     Referring provider: Oley Bascom RAMAN, NP  Grace Daniels is a 34 y.o. female with Past Medical History: 11/2020: Coronavirus infection No date: Diabetes (HCC) No date: Hyperlipidemia No date: Hypertension No date: Morbid obesity (HCC) 05/2020: Right knee pain 05/2020: Right thigh pain No date: Sleep apnea 01/2021: Vitamin D  deficiency   HPI  Patient presents today for follow-up visit.  Overall she has been doing well.  A1c today in office was 9.2.  Patient states that she has stopped her Ozempic . Will trial mounjaro .   Denies f/c/s, n/v/d, hemoptysis, PND, leg swelling. Denies chest pain or edema.   Note: Patient complains today of low back pain. UA in office today was overall negative other than large glucose.  Will trial muscle relaxer.    Patient is requesting a referral to OB/GYN for Pap smear and possible mammogram due to breast pain.  Will place referral today per patient request.  Denies f/c/s, n/v/d, hemoptysis, PND, leg swelling Denies chest pain or edema    Allergies[1]  Immunization History  Administered Date(s) Administered   Tdap 03/26/2022    Tobacco History: Tobacco Use History[2] Counseling given: Not Answered   Outpatient Encounter Medications as of 12/21/2024  Medication Sig   atorvastatin  (LIPITOR) 10 MG tablet Take 1 tablet (10 mg total) by mouth daily.   Fiber Adult Gummies 2 g CHEW Chew 2 g by mouth daily.   glipiZIDE  (GLUCOTROL ) 5 MG tablet Take 1 tablet (5 mg total) by mouth daily.   ibuprofen  (ADVIL ) 800 MG tablet Take 1 tablet (800 mg total) by mouth every 8 (eight) hours as needed for up to 30 doses.   metFORMIN  (GLUCOPHAGE -XR) 500 MG 24 hr tablet TAKE 2 TABLETS (1,000 MG TOTAL) BY MOUTH 2 (TWO) TIMES DAILY WITH A MEAL.   tirzepatide  (MOUNJARO ) 2.5  MG/0.5ML Pen Inject 2.5 mg into the skin once a week.   triamterene -hydrochlorothiazide (DYAZIDE) 37.5-25 MG capsule Take 1 each (1 capsule total) by mouth daily.   [DISCONTINUED] cyclobenzaprine  (FLEXERIL ) 10 MG tablet Take 1 tablet (10 mg total) by mouth 3 (three) times daily as needed for muscle spasms.   [DISCONTINUED] glucose blood test strip Use as instructed   amLODipine  (NORVASC ) 5 MG tablet TAKE 1 TABLET (5 MG TOTAL) BY MOUTH DAILY. (Patient not taking: Reported on 12/21/2024)   benzonatate  (TESSALON ) 100 MG capsule Take 1 capsule (100 mg total) by mouth 3 (three) times daily as needed. (Patient not taking: Reported on 12/21/2024)   cyclobenzaprine  (FLEXERIL ) 10 MG tablet Take 1 tablet (10 mg total) by mouth 3 (three) times daily as needed for muscle spasms.   glucose blood test strip Use as instructed   lidocaine  (LIDODERM ) 5 % Place 1 patch onto the skin daily. Remove & Discard patch within 12 hours or as directed by MD (Patient not taking: Reported on 12/21/2024)   meloxicam  (MOBIC ) 15 MG tablet TAKE 1 TABLET (15 MG TOTAL) BY MOUTH DAILY. (Patient not taking: Reported on 12/21/2024)   [DISCONTINUED] Semaglutide , 1 MG/DOSE, 4 MG/3ML SOPN Inject 1 mg into the skin once a week. (Patient not taking: Reported on 12/21/2024)   [EXPIRED] ketorolac  (TORADOL ) injection 60 mg    No facility-administered encounter medications on file as of 12/21/2024.  Review of Systems  Review of Systems  Constitutional: Negative.   HENT: Negative.    Cardiovascular: Negative.   Gastrointestinal: Negative.   Allergic/Immunologic: Negative.   Neurological: Negative.   Psychiatric/Behavioral: Negative.       Objective:   BP 134/77   Pulse 93   Temp 98.2 F (36.8 C) (Temporal)   Wt (!) 326 lb (147.9 kg)   SpO2 99%   BMI 46.78 kg/m   Wt Readings from Last 5 Encounters:  12/21/24 (!) 326 lb (147.9 kg)  11/06/24 (!) 320 lb (145.2 kg)  09/15/24 (!) 321 lb 9.6 oz (145.9 kg)  07/18/24 (!) 320 lb (145.2 kg)   05/12/24 (!) 324 lb 12.8 oz (147.3 kg)     Physical Exam Vitals and nursing note reviewed.  Constitutional:      General: She is not in acute distress.    Appearance: She is well-developed.  Cardiovascular:     Rate and Rhythm: Normal rate and regular rhythm.  Pulmonary:     Effort: Pulmonary effort is normal.     Breath sounds: Normal breath sounds.  Neurological:     Mental Status: She is alert and oriented to person, place, and time.       Assessment & Plan:   Type 2 diabetes mellitus without complication, without long-term current use of insulin  (HCC) -     POCT glycosylated hemoglobin (Hb A1C) -     Microalbumin / creatinine urine ratio -     Glucose Blood; Use as instructed  Dispense: 100 each; Refill: 12 -     Tirzepatide ; Inject 2.5 mg into the skin once a week.  Dispense: 2 mL; Refill: 2 -     CBC -     Comprehensive metabolic panel with GFR  Frequent urination -     POCT URINE DIPSTICK  Breast pain -     Ambulatory referral to Obstetrics / Gynecology  Cervical cancer screening -     Ambulatory referral to Obstetrics / Gynecology  Lipid screening -     Lipid panel  Thyroid  disorder screen -     TSH  Acute left-sided low back pain without sciatica -     Cyclobenzaprine  HCl; Take 1 tablet (10 mg total) by mouth 3 (three) times daily as needed for muscle spasms.  Dispense: 30 tablet; Refill: 0  Chronic bilateral low back pain without sciatica -     Ketorolac  Tromethamine      Return in about 3 months (around 03/20/2025).     Bascom GORMAN Borer, NP 12/21/2024     [1] No Known Allergies [2]  Social History Tobacco Use  Smoking Status Never   Passive exposure: Never  Smokeless Tobacco Never   "

## 2024-12-22 LAB — COMPREHENSIVE METABOLIC PANEL WITH GFR
ALT: 10 [IU]/L (ref 0–32)
AST: 6 [IU]/L (ref 0–40)
Albumin: 4 g/dL (ref 3.9–4.9)
Alkaline Phosphatase: 81 [IU]/L (ref 41–116)
BUN/Creatinine Ratio: 18 (ref 9–23)
BUN: 10 mg/dL (ref 6–20)
Bilirubin Total: <0.2 mg/dL (ref 0.0–1.2)
CO2: 19 mmol/L — ABNORMAL LOW (ref 20–29)
Calcium: 9.3 mg/dL (ref 8.7–10.2)
Chloride: 103 mmol/L (ref 96–106)
Creatinine, Ser: 0.57 mg/dL (ref 0.57–1.00)
Globulin, Total: 3.1 g/dL (ref 1.5–4.5)
Glucose: 335 mg/dL — ABNORMAL HIGH (ref 70–99)
Potassium: 4.3 mmol/L (ref 3.5–5.2)
Sodium: 136 mmol/L (ref 134–144)
Total Protein: 7.1 g/dL (ref 6.0–8.5)
eGFR: 122 mL/min/{1.73_m2}

## 2024-12-22 LAB — CBC
Hematocrit: 38.9 % (ref 34.0–46.6)
Hemoglobin: 11.5 g/dL (ref 11.1–15.9)
MCH: 21.5 pg — ABNORMAL LOW (ref 26.6–33.0)
MCHC: 29.6 g/dL — ABNORMAL LOW (ref 31.5–35.7)
MCV: 73 fL — ABNORMAL LOW (ref 79–97)
Platelets: 358 10*3/uL (ref 150–450)
RBC: 5.36 x10E6/uL — ABNORMAL HIGH (ref 3.77–5.28)
RDW: 17.1 % — ABNORMAL HIGH (ref 11.7–15.4)
WBC: 8 10*3/uL (ref 3.4–10.8)

## 2024-12-22 LAB — LIPID PANEL
Chol/HDL Ratio: 4 ratio (ref 0.0–4.4)
Cholesterol, Total: 137 mg/dL (ref 100–199)
HDL: 34 mg/dL — ABNORMAL LOW
LDL Chol Calc (NIH): 75 mg/dL (ref 0–99)
Triglycerides: 160 mg/dL — ABNORMAL HIGH (ref 0–149)
VLDL Cholesterol Cal: 28 mg/dL (ref 5–40)

## 2024-12-22 LAB — TSH: TSH: 1.83 u[IU]/mL (ref 0.450–4.500)

## 2024-12-22 LAB — MICROALBUMIN / CREATININE URINE RATIO
Creatinine, Urine: 43.4 mg/dL
Microalb/Creat Ratio: 25 mg/g{creat} (ref 0–29)
Microalbumin, Urine: 10.9 ug/mL

## 2025-01-10 ENCOUNTER — Other Ambulatory Visit: Payer: Self-pay

## 2025-03-21 ENCOUNTER — Ambulatory Visit: Payer: Self-pay | Admitting: Nurse Practitioner
